# Patient Record
Sex: Female | Born: 1965 | Race: Black or African American | Hispanic: No | Marital: Single | Smoking: Current every day smoker
Health system: Southern US, Community
[De-identification: ages and names within clinical notes are randomized; demographics above are authoritative.]

## PROBLEM LIST (undated history)

## (undated) DIAGNOSIS — K449 Diaphragmatic hernia without obstruction or gangrene: Secondary | ICD-10-CM

## (undated) DIAGNOSIS — M1990 Unspecified osteoarthritis, unspecified site: Secondary | ICD-10-CM

## (undated) DIAGNOSIS — M719 Bursopathy, unspecified: Secondary | ICD-10-CM

## (undated) DIAGNOSIS — K219 Gastro-esophageal reflux disease without esophagitis: Secondary | ICD-10-CM

## (undated) DIAGNOSIS — F419 Anxiety disorder, unspecified: Secondary | ICD-10-CM

## (undated) DIAGNOSIS — G43909 Migraine, unspecified, not intractable, without status migrainosus: Secondary | ICD-10-CM

## (undated) DIAGNOSIS — J45909 Unspecified asthma, uncomplicated: Secondary | ICD-10-CM

## (undated) DIAGNOSIS — Z8619 Personal history of other infectious and parasitic diseases: Secondary | ICD-10-CM

## (undated) HISTORY — PX: BACK SURGERY: SHX140

## (undated) HISTORY — DX: Bursopathy, unspecified: M71.9

## (undated) HISTORY — PX: TUBAL LIGATION: SHX77

## (undated) HISTORY — DX: Gastro-esophageal reflux disease without esophagitis: K21.9

---

## 2006-06-11 ENCOUNTER — Encounter: Admission: RE | Admit: 2006-06-11 | Discharge: 2006-06-11 | Payer: Self-pay | Admitting: Specialist

## 2006-07-19 ENCOUNTER — Ambulatory Visit (HOSPITAL_COMMUNITY): Admission: RE | Admit: 2006-07-19 | Discharge: 2006-07-20 | Payer: Self-pay | Admitting: Specialist

## 2009-05-01 ENCOUNTER — Emergency Department (HOSPITAL_COMMUNITY): Admission: EM | Admit: 2009-05-01 | Discharge: 2009-05-01 | Payer: Self-pay | Admitting: Emergency Medicine

## 2010-05-24 ENCOUNTER — Emergency Department (HOSPITAL_COMMUNITY)
Admission: EM | Admit: 2010-05-24 | Discharge: 2010-05-24 | Payer: Self-pay | Source: Home / Self Care | Admitting: Family Medicine

## 2010-09-23 NOTE — Op Note (Signed)
[name], [name]              ACCOUNT NO.:  [id number]   MEDICAL RECORD NO.:  [id number]          PATIENT TYPE:  AMB   LOCATION:  DAY                          FACILITY:  [facility]   PHYSICIAN:  Jene Every, M.D.    DATE OF BIRTH:  11/11/1965   DATE OF PROCEDURE:  07/19/2006  DATE OF DISCHARGE:                               OPERATIVE REPORT   PREOPERATIVE DIAGNOSIS:  Spinal stenosis L4-5, right.   POSTOPERATIVE DIAGNOSIS:  Spinal stenosis L4-5, right.   PROCEDURE PERFORMED:  1. Lateral recessed decompression.  2. Foraminotomy of L5.   ANESTHESIA:  General.   ASSISTANT:  Roma Schanz, P.A.   BRIEF HISTORY/INDICATIONS:  A 45 year old with right lower extremity  radiculopathy, L5 nerve root distribution and positive neurotension  signs, EHL weakness, MRI, and myelogram indicating lateral recess  stenosis impinging upon the 5 root.  She was indicated for decompression  of L5 root, bilateral recess decompression.  The risks and benefits  discussed including bleeding, infection of the surrounding structures,  CSF leakage, __________ , need for procedure in the future, anesthetic  complications etcetera.   TECHNIQUE:  With the patient in the supine position after the  administration of general anesthesia and 1 gram Kefzol, she was placed  prone on the __________ frame and all bony prominences were well padded.  Lumbar region was prepped and draped in the usual sterile fashion.  A 22-  gauge spinal needle was used to localize the L4-5 interspace confirmed  with an x-ray.  Incision was made from the spinous process L4-5.  Subcutaneous tissue was resected and electrocautery used to achieve  hemostasis.  The dorsal lumbar fascia identified and __________ skin  incision.  The paraspinous muscle elevated from lamina 4 and 5.  The  McCullough retractor was placed.  Operating microscope was draped and  brought out onto the surgical field after a second radiograph confirmed  the 4-5  interspace.   A hemilaminotomy of the caudal edge of L4 was performed with a 2-3 mm  Kerrison to the cephalad edge of the ligamentum and flavum detaching it.  A foraminotomy of L5 was then performed after the ligamenta flavum  detached from the cephalad edge of L5.  Compression of the L5 root was  noted in the lateral recess, multifactorial.  A decompression of the  lateral recess in the medial border of the pedicle.  Foramina of the L5  root had at least 1 cm of excursion meeting in the pedicle without  difficulty.  This was fully evaluated and there was no evidence of a  significant disk herniation requiring a discectomy.  I passed a hockey-  stick probe in he foramen of L5 and L4 and found them widely patent  after the decompression.  The wound was copiously irrigated and  __________ surfaces.  Thrombin-soaked Gelfoam in the laminotomy defect.  I removed the McCullough retractors and no evidence of paraspinous  bleeding or CSF leakage.   The dorsal lumbar fascia was reapproximated with #1 Vicryl and  interrupted figure-of-eight sutures. Subcutaneous tissue reapproximated  with 2-0 Vicryl sutures; and skin was reapproximated with  Prolene.  The  wound was  reinforced with Steri-Strips, and a sterile dressing applied.  Placed  supine on the hospital bed and extubated without difficulty, and  transported to the recovery room in satisfactory condition.   The patient tolerated the procedure well, there were no complications.      Jene Every, M.D.  Electronically Signed     JB/MEDQ  D:  07/19/2006  T:  07/20/2006  Job:  [id number]

## 2010-10-31 ENCOUNTER — Inpatient Hospital Stay (INDEPENDENT_AMBULATORY_CARE_PROVIDER_SITE_OTHER)
Admission: RE | Admit: 2010-10-31 | Discharge: 2010-10-31 | Disposition: A | Discharge status: Home / Self Care | Payer: Self-pay | Source: Ambulatory Visit | Attending: Family Medicine | Admitting: Family Medicine

## 2010-10-31 DIAGNOSIS — N898 Other specified noninflammatory disorders of vagina: Secondary | ICD-10-CM

## 2010-10-31 DIAGNOSIS — L02419 Cutaneous abscess of limb, unspecified: Secondary | ICD-10-CM

## 2010-10-31 LAB — POCT I-STAT, CHEM 8
Glucose, Bld: 92 mg/dL (ref 70–99)
HCT: 43 % (ref 36.0–46.0)

## 2010-10-31 LAB — POCT PREGNANCY, URINE: Preg Test, Ur: NEGATIVE

## 2010-12-10 ENCOUNTER — Inpatient Hospital Stay (INDEPENDENT_AMBULATORY_CARE_PROVIDER_SITE_OTHER)
Admission: RE | Admit: 2010-12-10 | Discharge: 2010-12-10 | Disposition: A | Discharge status: Home / Self Care | Payer: Self-pay | Source: Ambulatory Visit | Attending: Emergency Medicine | Admitting: Emergency Medicine

## 2010-12-10 DIAGNOSIS — T6391XA Toxic effect of contact with unspecified venomous animal, accidental (unintentional), initial encounter: Secondary | ICD-10-CM

## 2011-01-22 ENCOUNTER — Emergency Department (HOSPITAL_COMMUNITY): Payer: Self-pay

## 2011-01-22 ENCOUNTER — Emergency Department (HOSPITAL_COMMUNITY)
Admission: EM | Admit: 2011-01-22 | Discharge: 2011-01-23 | Disposition: A | Discharge status: Home / Self Care | Payer: Self-pay | Attending: Emergency Medicine | Admitting: Emergency Medicine

## 2011-01-22 DIAGNOSIS — X58XXXA Exposure to other specified factors, initial encounter: Secondary | ICD-10-CM | POA: Insufficient documentation

## 2011-01-22 DIAGNOSIS — Y9289 Other specified places as the place of occurrence of the external cause: Secondary | ICD-10-CM | POA: Insufficient documentation

## 2011-01-22 DIAGNOSIS — M25579 Pain in unspecified ankle and joints of unspecified foot: Secondary | ICD-10-CM | POA: Insufficient documentation

## 2011-01-22 DIAGNOSIS — S93409A Sprain of unspecified ligament of unspecified ankle, initial encounter: Secondary | ICD-10-CM | POA: Insufficient documentation

## 2011-03-25 ENCOUNTER — Encounter: Payer: Self-pay | Admitting: *Deleted

## 2011-03-25 ENCOUNTER — Emergency Department (HOSPITAL_COMMUNITY)
Admission: EM | Admit: 2011-03-25 | Discharge: 2011-03-26 | Disposition: A | Discharge status: Home / Self Care | Payer: Self-pay | Attending: Emergency Medicine | Admitting: Emergency Medicine

## 2011-03-25 DIAGNOSIS — M25571 Pain in right ankle and joints of right foot: Secondary | ICD-10-CM

## 2011-03-25 DIAGNOSIS — M545 Low back pain, unspecified: Secondary | ICD-10-CM | POA: Insufficient documentation

## 2011-03-25 DIAGNOSIS — M79609 Pain in unspecified limb: Secondary | ICD-10-CM | POA: Insufficient documentation

## 2011-03-25 DIAGNOSIS — F17200 Nicotine dependence, unspecified, uncomplicated: Secondary | ICD-10-CM | POA: Insufficient documentation

## 2011-03-25 DIAGNOSIS — M25473 Effusion, unspecified ankle: Secondary | ICD-10-CM | POA: Insufficient documentation

## 2011-03-25 DIAGNOSIS — IMO0001 Reserved for inherently not codable concepts without codable children: Secondary | ICD-10-CM | POA: Insufficient documentation

## 2011-03-25 DIAGNOSIS — M25476 Effusion, unspecified foot: Secondary | ICD-10-CM | POA: Insufficient documentation

## 2011-03-25 DIAGNOSIS — M5431 Sciatica, right side: Secondary | ICD-10-CM

## 2011-03-25 DIAGNOSIS — M5430 Sciatica, unspecified side: Secondary | ICD-10-CM | POA: Insufficient documentation

## 2011-03-25 DIAGNOSIS — M25579 Pain in unspecified ankle and joints of unspecified foot: Secondary | ICD-10-CM | POA: Insufficient documentation

## 2011-03-25 NOTE — ED Notes (Signed)
Patient c/o lower back pain radiating to her leg and foot.  Symptoms started yesteday

## 2011-03-26 ENCOUNTER — Emergency Department (HOSPITAL_COMMUNITY): Payer: Self-pay

## 2011-03-26 MED ORDER — METHYLPREDNISOLONE 4 MG PO KIT: 4.0000 mg | PACK | Freq: Once | ORAL | Status: DC

## 2011-03-26 NOTE — ED Provider Notes (Signed)
History     CSN: [id number] Arrival date & time: 03/25/2011  9:49 PM   First MD Initiated Contact with Patient 03/25/11 2342      Chief Complaint  Patient presents with  . Back Pain  . Leg Pain  . Foot Pain    (Consider location/radiation/quality/duration/timing/severity/associated sxs/prior treatment) Patient is a 45 y.o. female presenting with back pain. The history is provided by the patient.  Back Pain  This is a new problem. The problem occurs constantly. The pain is associated with no known injury. The pain is present in the gluteal region and lumbar spine. The quality of the pain is described as shooting. The pain radiates to the right thigh. The pain is moderate. The symptoms are aggravated by certain positions. The pain is the same all the time. Pertinent negatives include no chest pain, no fever, no numbness, no headaches, no abdominal pain, no bowel incontinence, no perianal numbness, no bladder incontinence, no pelvic pain, no leg pain, no paresthesias, no tingling and no weakness.  She is s/p lumbar disc surgery (she thinks this was L4-L5) which was performed by a local orthopedist. For the past 2 days she has had pain in her back which seems to radiate into the R gluteal region and down her leg to the knee.  Additionally, she presents with some ankle pain which has been present for the last several weeks. She sprained her ankle which she thinks probably started the problem. The pain seems to be mostly in the lateral dorsum, and is worse when she has to stand or walk a lot.   History reviewed. No pertinent past medical history.  History reviewed. No pertinent past surgical history.  History reviewed. No pertinent family history.  History  Substance Use Topics  . Smoking status: Current Everyday Smoker -- 1.0 packs/day  . Smokeless tobacco: Not on file  . Alcohol Use: No    OB History    Grav Para Term Preterm Abortions TAB SAB Ect Mult Living                   Review of Systems  Constitutional: Negative for fever, chills and activity change.  HENT: Negative for neck pain and neck stiffness.   Respiratory: Negative for shortness of breath.   Cardiovascular: Negative for chest pain.  Gastrointestinal: Negative for abdominal pain and bowel incontinence.  Genitourinary: Negative for bladder incontinence, frequency, flank pain, difficulty urinating and pelvic pain.  Musculoskeletal: Positive for myalgias and back pain. Negative for joint swelling and gait problem.  Skin: Negative for rash.  Neurological: Negative for tingling, weakness, numbness, headaches and paresthesias.    Allergies  Review of patient's allergies indicates no known allergies.  Home Medications   Current Outpatient Rx  Name Route Sig Dispense Refill  . CITALOPRAM HYDROBROMIDE 40 MG PO TABS Oral Take 40 mg by mouth daily.      . IBUPROFEN 200 MG PO TABS Oral Take 400 mg by mouth every 6 (six) hours as needed. pain       BP 136/74  Pulse 83  Temp(Src) 98.3 F (36.8 C) (Oral)  Resp 16  SpO2 98%  Physical Exam  Nursing note and vitals reviewed. Constitutional: She appears well-developed and well-nourished. No distress.  HENT:  Head: Normocephalic and atraumatic.  Right Ear: External ear normal.  Left Ear: External ear normal.  Nose: Nose normal.  Mouth/Throat: Oropharynx is clear and moist. No oropharyngeal exudate.  Eyes: Conjunctivae are normal. Pupils are equal, round, and  reactive to light.  Neck: Normal range of motion.  Musculoskeletal: Normal range of motion.       Spine: no palpable stepoff, crepitus, deformity. No paraspinal muscle spasm. Tender to palpation in gluteal region. Straight leg raise positive.  Ankle: tender to palpation over lateral malleolus and over ATF. No palpable deformity, crepitus. FROM and strength.  Skin: She is not diaphoretic.    ED Course  Procedures (including critical care time)  Labs Reviewed - No data to display Dg  Ankle Complete Right  03/26/2011  *RADIOLOGY REPORT*  Clinical Data: Lateral ankle pain.  RIGHT ANKLE - COMPLETE 3+ VIEW  Comparison: 01/22/2011  Findings: Mild soft tissue swelling overlies the lateral malleolus. There is no acute fracture or dislocation.  Ankle mortise remains intact.  Midfoot DJD.  Posterior calcaneal enthesiophyte/calcification along the Achilles tendon insertion, similar to prior.  IMPRESSION: No acute osseous abnormality.  Midfoot DJD.  Original Report Authenticated By: Waneta Martins, M.D.     1. Sciatica of right side   2. Ankle pain, right       MDM  Radicular pain c/w sciatica on R. Will tx with prednisone. Plain film of ankle unremarkable apart from soft tissue swelling, poss DJD midfoot. It is possible that she has a ligamentous injury. Will refer to ortho for further eval/mgmt. She was instructed to use RICE in the meantime. She verbalized understanding and agreed to plan.        Grant Fontana, [place] 03/27/11 1003

## 2011-03-26 NOTE — ED Notes (Signed)
Pt given ace wrap for ankle

## 2011-03-29 NOTE — ED Provider Notes (Signed)
Evaluation and management procedures were performed by the mid-level provider (PA/NP/CNM) under my supervision/collaboration. I was present and available during the ED course. Debroah Shuttleworth Y.   Gavin Pound. Romir Klimowicz, MD 03/29/11 1021

## 2012-02-06 ENCOUNTER — Emergency Department (HOSPITAL_COMMUNITY)
Admission: EM | Admit: 2012-02-06 | Discharge: 2012-02-06 | Disposition: A | Discharge status: Home / Self Care | Payer: Medicaid Other | Attending: Emergency Medicine | Admitting: Emergency Medicine

## 2012-02-06 ENCOUNTER — Encounter (HOSPITAL_COMMUNITY): Payer: Self-pay | Admitting: *Deleted

## 2012-02-06 DIAGNOSIS — IMO0002 Reserved for concepts with insufficient information to code with codable children: Secondary | ICD-10-CM | POA: Insufficient documentation

## 2012-02-06 DIAGNOSIS — F17200 Nicotine dependence, unspecified, uncomplicated: Secondary | ICD-10-CM | POA: Insufficient documentation

## 2012-02-06 DIAGNOSIS — S39012A Strain of muscle, fascia and tendon of lower back, initial encounter: Secondary | ICD-10-CM

## 2012-02-06 DIAGNOSIS — M7661 Achilles tendinitis, right leg: Secondary | ICD-10-CM

## 2012-02-06 DIAGNOSIS — X58XXXA Exposure to other specified factors, initial encounter: Secondary | ICD-10-CM | POA: Insufficient documentation

## 2012-02-06 DIAGNOSIS — M25569 Pain in unspecified knee: Secondary | ICD-10-CM | POA: Insufficient documentation

## 2012-02-06 DIAGNOSIS — M5410 Radiculopathy, site unspecified: Secondary | ICD-10-CM

## 2012-02-06 DIAGNOSIS — G8929 Other chronic pain: Secondary | ICD-10-CM | POA: Insufficient documentation

## 2012-02-06 DIAGNOSIS — S335XXA Sprain of ligaments of lumbar spine, initial encounter: Secondary | ICD-10-CM | POA: Insufficient documentation

## 2012-02-06 MED ORDER — OXYCODONE-ACETAMINOPHEN 5-325 MG PO TABS: 1.0000 | ORAL_TABLET | ORAL | Status: DC | PRN

## 2012-02-06 MED ORDER — KETOROLAC TROMETHAMINE 60 MG/2ML IM SOLN
60.0000 mg | Freq: Once | INTRAMUSCULAR | Status: AC
  Administered 2012-02-06: 60 mg via INTRAMUSCULAR
  Filled 2012-02-06: qty 2

## 2012-02-06 NOTE — ED Provider Notes (Signed)
History     CSN: [id number]  Arrival date & time 02/06/12  0140   First MD Initiated Contact with Patient 02/06/12 [phone]      Chief Complaint  Patient presents with  . Back Pain  . Hip Pain  . Knee Pain  . Foot Pain    (Consider location/radiation/quality/duration/timing/severity/associated sxs/prior treatment) HPI History provided by pt and prior chart.  Pt presents w/ gradually worsening left low back pain x 2 months.  Has also had a toothache-like pain in right buttock with associated paresthesias in RLE.  Denies trauma.  Denies fever, urinary sx and bladder/bowel retention/incontinence.  Per prior chart, pt has h/o lumbar stenosis and underwent a Lateral recessed decompression and Foraminotomy of L5 in 2008.  Also c/o chronic pain in R knee w/ occasional giving way and several days of pain in posterior right heel that is aggravated by bearing weight and is most prominent first thing in the morning.  Has been taking tylenol for ankle, knee and low back pain w/out relief.   History reviewed. No pertinent past medical history.  Past Surgical History  Procedure Date  . Back surgery   . Tubal ligation     No family history on file.  History  Substance Use Topics  . Smoking status: Current Every Day Smoker -- 1.0 packs/day  . Smokeless tobacco: Not on file  . Alcohol Use: No    OB History    Grav Para Term Preterm Abortions TAB SAB Ect Mult Living                  Review of Systems  All other systems reviewed and are negative.    Allergies  Review of patient's allergies indicates no known allergies.  Home Medications   Current Outpatient Rx  Name Route Sig Dispense Refill  . ACETAMINOPHEN 325 MG PO TABS Oral Take 650 mg by mouth every 6 (six) hours as needed. For pain      LMP 01/22/2012  Physical Exam  Nursing note and vitals reviewed. Constitutional: She is oriented to person, place, and time. She appears well-developed and well-nourished.  HENT:  Head:  Normocephalic and atraumatic.  Eyes:       Normal appearance  Neck: Normal range of motion.  Cardiovascular: Normal rate and regular rhythm.   Pulmonary/Chest: Effort normal and breath sounds normal.  Genitourinary:       No CVA ttp  Musculoskeletal:       Vertical surgical scar at approx L3-L5.  Mild tenderness in this location as well as left lumbar paraspinals.  Full active ROM of LE.  Nml patellar reflexes.  No saddle anesthesia. Distal sensation intact.  2+ DP pulses.  Ambulates w/out diffulty.  Right knee w/out edema, skin changes or tenderness.  Minimal pain w/ passive ROM.  Tenderness of achilles tendon.  No overlying skin changes. Pain at achilles w/ dorsi/plantar flexion of foot.    Neurological: She is alert and oriented to person, place, and time.  Skin: Skin is warm and dry. No rash noted.  Psychiatric: She has a normal mood and affect. Her behavior is normal.    ED Course  Procedures (including critical care time)  Labs Reviewed - No data to display No results found.   1. Lumbar strain   2. Radiculopathy       MDM  46yo healthy F w/ h/o lumbar stenosis, s/p lateral recess decompression surgery in 2008, presents w/ non-traumatic, gradually worsening left low back pain and right  buttock pain w/ RLE paresthesias.  S/sx most consistent w/ left lumbar paraspinal strain as well as right sciatica.  No back pain red flags.  Also c/o chronic right knee pain w/ occasional giving way.  No signs of joint infection on exam.  Ortho tech placed in knee immobilizer.  Also c/o pain at right achilles tendon.  Most likely has tendonitis based on exam.  Pt received IM toradol and d/c'd home w/ percocet and referral back to ortho for all of her complaints.  Return precautions discussed.         Otilio Miu, [place] 02/06/12 [phone]

## 2012-02-06 NOTE — ED Notes (Signed)
Pt stated she was seen in a hospital in [place] and was diagnosed with heel spur.  Pt reports pain with foot flexion and difficulty walking. Pt stated she cannot bend down due to pain,.

## 2012-02-06 NOTE — ED Notes (Addendum)
C/o low back pain, R hip, R knee and bilateral heel pain. Onset 1 month ago, "fairly constant". H/o 2009 back surgery (Dr. Shelle Iron). Denies sx other than pain. (denies: nvd, fever, bleeding urinary or vaginal sx). Tried tylenol ES at 1800 w/o relief. Denies recent activity that would cause pain or injury. Alert, NAD, calm, interactive.

## 2012-06-17 ENCOUNTER — Emergency Department (HOSPITAL_COMMUNITY)
Admission: EM | Admit: 2012-06-17 | Discharge: 2012-06-17 | Disposition: A | Discharge status: Home / Self Care | Payer: Medicaid Other | Attending: Emergency Medicine | Admitting: Emergency Medicine

## 2012-06-17 ENCOUNTER — Encounter (HOSPITAL_COMMUNITY): Payer: Self-pay | Admitting: Emergency Medicine

## 2012-06-17 DIAGNOSIS — M5416 Radiculopathy, lumbar region: Secondary | ICD-10-CM

## 2012-06-17 DIAGNOSIS — M5430 Sciatica, unspecified side: Secondary | ICD-10-CM | POA: Insufficient documentation

## 2012-06-17 DIAGNOSIS — IMO0002 Reserved for concepts with insufficient information to code with codable children: Secondary | ICD-10-CM | POA: Insufficient documentation

## 2012-06-17 DIAGNOSIS — M25559 Pain in unspecified hip: Secondary | ICD-10-CM | POA: Insufficient documentation

## 2012-06-17 DIAGNOSIS — M129 Arthropathy, unspecified: Secondary | ICD-10-CM | POA: Insufficient documentation

## 2012-06-17 DIAGNOSIS — Z9889 Other specified postprocedural states: Secondary | ICD-10-CM | POA: Insufficient documentation

## 2012-06-17 DIAGNOSIS — J45909 Unspecified asthma, uncomplicated: Secondary | ICD-10-CM | POA: Insufficient documentation

## 2012-06-17 DIAGNOSIS — F17200 Nicotine dependence, unspecified, uncomplicated: Secondary | ICD-10-CM | POA: Insufficient documentation

## 2012-06-17 HISTORY — DX: Unspecified osteoarthritis, unspecified site: M19.90

## 2012-06-17 HISTORY — DX: Unspecified asthma, uncomplicated: J45.909

## 2012-06-17 MED ORDER — HYDROCODONE-ACETAMINOPHEN 5-325 MG PO TABS: 1.0000 | ORAL_TABLET | ORAL | Status: DC | PRN

## 2012-06-17 MED ORDER — IBUPROFEN 600 MG PO TABS: 600.0000 mg | ORAL_TABLET | Freq: Four times a day (QID) | ORAL | Status: DC | PRN

## 2012-06-17 MED ORDER — DIAZEPAM 2 MG PO TABS
2.0000 mg | ORAL_TABLET | Freq: Once | ORAL | Status: AC
  Administered 2012-06-17: 2 mg via ORAL
  Filled 2012-06-17: qty 1

## 2012-06-17 MED ORDER — PREDNISONE 50 MG PO TABS: 50.0000 mg | ORAL_TABLET | Freq: Every day | ORAL | Status: DC

## 2012-06-17 MED ORDER — IBUPROFEN 200 MG PO TABS
600.0000 mg | ORAL_TABLET | Freq: Once | ORAL | Status: AC
  Administered 2012-06-17: 600 mg via ORAL
  Filled 2012-06-17: qty 1

## 2012-06-17 MED ORDER — METHOCARBAMOL 500 MG PO TABS: 500.0000 mg | ORAL_TABLET | Freq: Two times a day (BID) | ORAL | Status: DC

## 2012-06-17 MED ORDER — PREDNISONE 20 MG PO TABS
60.0000 mg | ORAL_TABLET | Freq: Once | ORAL | Status: AC
  Administered 2012-06-17: 60 mg via ORAL
  Filled 2012-06-17: qty 3

## 2012-06-17 MED ORDER — HYDROCODONE-ACETAMINOPHEN 5-325 MG PO TABS
2.0000 | ORAL_TABLET | Freq: Once | ORAL | Status: AC
  Administered 2012-06-17: 2 via ORAL
  Filled 2012-06-17: qty 2

## 2012-06-17 NOTE — ED Provider Notes (Addendum)
History     This chart was scribed for Derwood Kaplan, MD, MD by Smitty Pluck, ED Scribe. The patient was seen in room TR11C/TR11C and the patient's care was started at 9:25 PM.   CSN: [id number]  Arrival date & time 06/17/12  2041   Chief Complaint  Patient presents with  . Hip Pain  . Back Pain    (Consider location/radiation/quality/duration/timing/severity/associated sxs/prior treatment) The history is provided by the patient and medical records. No language interpreter was used.   MIRAH NEVINS is a 47 y.o. female who presents to the Emergency Department complaining of constant, moderate sharp pain enitre back pain and right hip pain that radiates to right leg onset 10 minutes ago today. She reports sudden onset. She reports that she was sitting down when pain began. Twisting, rotating, movement and bending aggravates the pain. She states she takes ibuprofen daily for back pain. She reports that she has hx of back pain, sciatica and back surgery. This pain feels similar to past back pain. Pt denies recent injury, vaginal discharge, urinary incontinence, bowel incontinence, fever, chills, nausea, vomiting, diarrhea, weakness, cough, SOB and any other pain. She denies hx of cancer. She reports that she smokes 1 pack/day.     Past Medical History  Diagnosis Date  . Arthritis     Past Surgical History  Procedure Laterality Date  . Back surgery    . Tubal ligation      History reviewed. No pertinent family history.  History  Substance Use Topics  . Smoking status: Current Every Day Smoker -- 1.00 packs/day  . Smokeless tobacco: Not on file  . Alcohol Use: No    OB History   Grav Para Term Preterm Abortions TAB SAB Ect Mult Living                  Review of Systems  Constitutional: Negative for fever and chills.  Respiratory: Negative for shortness of breath.   Gastrointestinal: Negative for nausea and vomiting.  Musculoskeletal: Positive for back pain and  arthralgias.  Neurological: Negative for weakness.  All other systems reviewed and are negative.    Allergies  Review of patient's allergies indicates no known allergies.  Home Medications   Current Outpatient Rx  Name  Route  Sig  Dispense  Refill  . acetaminophen (TYLENOL) 325 MG tablet   Oral   Take 650 mg by mouth every 6 (six) hours as needed. For pain         . oxyCODONE-acetaminophen (PERCOCET/ROXICET) 5-325 MG per tablet   Oral   Take 1 tablet by mouth every 4 (four) hours as needed for pain.   20 tablet   0     BP 132/68  Pulse 80  Temp(Src) 98.9 F (37.2 C) (Oral)  Resp 16  SpO2 98%  LMP 05/27/2012  Physical Exam  Nursing note and vitals reviewed. Constitutional: She is oriented to person, place, and time. She appears well-developed and well-nourished. No distress.  HENT:  Head: Normocephalic and atraumatic.  Eyes: EOM are normal.  Neck: Neck supple. No tracheal deviation present.  Cardiovascular: Normal rate, regular rhythm and normal heart sounds.   No murmur heard. Pulmonary/Chest: Effort normal and breath sounds normal. No respiratory distress. She has no wheezes. She has no rales.  Musculoskeletal: Normal range of motion.  No tenderness with palpation of vertebral column or lower back.  No step off.    Neurological: She is alert and oriented to person, place, and time.  Negative passive straight leg on right side  Positive active straight leg on right side   Skin: Skin is warm and dry.  Psychiatric: She has a normal mood and affect. Her behavior is normal.    ED Course  Procedures (including critical care time) DIAGNOSTIC STUDIES: Oxygen Saturation is 98% on room air, normal by my interpretation.    COORDINATION OF CARE: 9:33 PM Discussed ED treatment with pt and pt agrees.     Labs Reviewed - No data to display No results found.   No diagnosis found.    MDM  I personally performed the services described in this documentation,  which was scribed in my presence. The recorded information has been reviewed and is accurate.  Pt comes in with cc of back pain. Pt's pain is consistent with sciatica - and she reports having been diagnosed with lower lumbar nerve impingement and sciatica in the past.  Will give appropriate meds and discharge.    Derwood Kaplan, MD 06/17/12 [id number]  Derwood Kaplan, MD 06/17/12 2152

## 2012-06-17 NOTE — Discharge Instructions (Signed)
Back Exercises Back exercises help treat and prevent back injuries. The goal of back exercises is to increase the strength of your abdominal and back muscles and the flexibility of your back. These exercises should be started when you no longer have back pain. Back exercises include:  Pelvic Tilt. Lie on your back with your knees bent. Tilt your pelvis until the lower part of your back is against the floor. Hold this position 5 to 10 sec and repeat 5 to 10 times.  Knee to Chest. Pull first 1 knee up against your chest and hold for 20 to 30 seconds, repeat this with the other knee, and then both knees. This may be done with the other leg straight or bent, whichever feels better.  Sit-Ups or Curl-Ups. Bend your knees 90 degrees. Start with tilting your pelvis, and do a partial, slow sit-up, lifting your trunk only 30 to 45 degrees off the floor. Take at least 2 to 3 seconds for each sit-up. Do not do sit-ups with your knees out straight. If partial sit-ups are difficult, simply do the above but with only tightening your abdominal muscles and holding it as directed.  Hip-Lift. Lie on your back with your knees flexed 90 degrees. Push down with your feet and shoulders as you raise your hips a couple inches off the floor; hold for 10 seconds, repeat 5 to 10 times.  Back arches. Lie on your stomach, propping yourself up on bent elbows. Slowly press on your hands, causing an arch in your low back. Repeat 3 to 5 times. Any initial stiffness and discomfort should lessen with repetition over time.  Shoulder-Lifts. Lie face down with arms beside your body. Keep hips and torso pressed to floor as you slowly lift your head and shoulders off the floor. Do not overdo your exercises, especially in the beginning. Exercises may cause you some mild back discomfort which lasts for a few minutes; however, if the pain is more severe, or lasts for more than 15 minutes, do not continue exercises until you see your caregiver.  Improvement with exercise therapy for back problems is slow.  See your caregivers for assistance with developing a proper back exercise program. Document Released: 06/01/2004 Document Revised: 07/17/2011 Document Reviewed: 02/23/2011 [facility] Patient Information 2013 [place], [place].  Lumbosacral Radiculopathy Lumbosacral radiculopathy is a pinched nerve or nerves in the low back (lumbosacral area). When this happens you may have weakness in your legs and may not be able to stand on your toes. You may have pain going down into your legs. There may be difficulties with walking normally. There are many causes of this problem. Sometimes this may happen from an injury, or simply from arthritis or boney problems. It may also be caused by other illnesses such as diabetes. If there is no improvement after treatment, further studies may be done to find the exact cause. DIAGNOSIS  X-rays may be needed if the problems become long standing. Electromyograms may be done. This study is one in which the working of nerves and muscles is studied. HOME CARE INSTRUCTIONS   Applications of ice packs may be helpful. Ice can be used in a plastic bag with a towel around it to prevent frostbite to skin. This may be used every 2 hours for 20 to 30 minutes, or as needed, while awake, or as directed by your caregiver.  Only take over-the-counter or prescription medicines for pain, discomfort, or fever as directed by your caregiver.  If physical therapy was prescribed, follow  your caregiver's directions. SEEK IMMEDIATE MEDICAL CARE IF:   You have pain not controlled with medications.  You seem to be getting worse rather than better.  You develop increasing weakness in your legs.  You develop loss of bowel or bladder control.  You have difficulty with walking or balance, or develop clumsiness in the use of your legs.  You have a fever. MAKE SURE YOU:   Understand these instructions.  Will watch your  condition.  Will get help right away if you are not doing well or get worse. Document Released: 04/24/2005 Document Revised: 07/17/2011 Document Reviewed: 12/13/2007 [facility] Patient Information 2013 [place], [place].

## 2012-06-17 NOTE — ED Notes (Signed)
Patient complaining of lower back pain and left hip pain that started ten minutes ago; patient has history of back surgery.  Denies any recent injury.  Ambulatory in triage; able to move all extremities without difficulty.  Denies loss in sensation or swelling.

## 2012-10-09 ENCOUNTER — Encounter (HOSPITAL_COMMUNITY): Payer: Self-pay | Admitting: *Deleted

## 2012-10-09 ENCOUNTER — Emergency Department (HOSPITAL_COMMUNITY)
Admission: EM | Admit: 2012-10-09 | Discharge: 2012-10-09 | Disposition: A | Discharge status: Home / Self Care | Payer: Medicaid Other | Attending: Emergency Medicine | Admitting: Emergency Medicine

## 2012-10-09 ENCOUNTER — Emergency Department (HOSPITAL_COMMUNITY): Payer: Medicaid Other

## 2012-10-09 DIAGNOSIS — Z8739 Personal history of other diseases of the musculoskeletal system and connective tissue: Secondary | ICD-10-CM | POA: Insufficient documentation

## 2012-10-09 DIAGNOSIS — M5430 Sciatica, unspecified side: Secondary | ICD-10-CM | POA: Insufficient documentation

## 2012-10-09 DIAGNOSIS — J45909 Unspecified asthma, uncomplicated: Secondary | ICD-10-CM | POA: Insufficient documentation

## 2012-10-09 DIAGNOSIS — Z79899 Other long term (current) drug therapy: Secondary | ICD-10-CM | POA: Insufficient documentation

## 2012-10-09 DIAGNOSIS — M79609 Pain in unspecified limb: Secondary | ICD-10-CM | POA: Insufficient documentation

## 2012-10-09 DIAGNOSIS — F17200 Nicotine dependence, unspecified, uncomplicated: Secondary | ICD-10-CM | POA: Insufficient documentation

## 2012-10-09 DIAGNOSIS — Z3202 Encounter for pregnancy test, result negative: Secondary | ICD-10-CM | POA: Insufficient documentation

## 2012-10-09 DIAGNOSIS — G8929 Other chronic pain: Secondary | ICD-10-CM | POA: Insufficient documentation

## 2012-10-09 DIAGNOSIS — R109 Unspecified abdominal pain: Secondary | ICD-10-CM | POA: Insufficient documentation

## 2012-10-09 DIAGNOSIS — Z9889 Other specified postprocedural states: Secondary | ICD-10-CM | POA: Insufficient documentation

## 2012-10-09 DIAGNOSIS — M7731 Calcaneal spur, right foot: Secondary | ICD-10-CM

## 2012-10-09 DIAGNOSIS — M549 Dorsalgia, unspecified: Secondary | ICD-10-CM

## 2012-10-09 DIAGNOSIS — M545 Low back pain, unspecified: Secondary | ICD-10-CM | POA: Insufficient documentation

## 2012-10-09 DIAGNOSIS — M7730 Calcaneal spur, unspecified foot: Secondary | ICD-10-CM | POA: Insufficient documentation

## 2012-10-09 LAB — CBC WITH DIFFERENTIAL/PLATELET
Eosinophils Absolute: 0.2 10*3/uL (ref 0.0–0.7)
Eosinophils Relative: 1 % (ref 0–5)
HCT: 37.4 % (ref 36.0–46.0)
Hemoglobin: 12.4 g/dL (ref 12.0–15.0)
MCH: 29 pg (ref 26.0–34.0)
MCHC: 33.2 g/dL (ref 30.0–36.0)
MCV: 87.4 fL (ref 78.0–100.0)
Monocytes Absolute: 0.7 10*3/uL (ref 0.1–1.0)
Neutrophils Relative %: 52 % (ref 43–77)
RBC: 4.28 MIL/uL (ref 3.87–5.11)
WBC: 11.6 10*3/uL — ABNORMAL HIGH (ref 4.0–10.5)

## 2012-10-09 LAB — URINALYSIS, ROUTINE W REFLEX MICROSCOPIC
Bilirubin Urine: NEGATIVE
Specific Gravity, Urine: 1.025 (ref 1.005–1.030)
Urobilinogen, UA: 1 mg/dL (ref 0.0–1.0)

## 2012-10-09 LAB — COMPREHENSIVE METABOLIC PANEL
Albumin: 3.3 g/dL — ABNORMAL LOW (ref 3.5–5.2)
Alkaline Phosphatase: 73 U/L (ref 39–117)
BUN: 13 mg/dL (ref 6–23)
CO2: 27 mEq/L (ref 19–32)
Calcium: 9.6 mg/dL (ref 8.4–10.5)
Chloride: 107 mEq/L (ref 96–112)
Creatinine, Ser: 0.74 mg/dL (ref 0.50–1.10)

## 2012-10-09 LAB — URINE MICROSCOPIC-ADD ON

## 2012-10-09 MED ORDER — OXYCODONE-ACETAMINOPHEN 5-325 MG PO TABS: 2.0000 | ORAL_TABLET | ORAL | Status: DC | PRN

## 2012-10-09 MED ORDER — KETOROLAC TROMETHAMINE 60 MG/2ML IM SOLN
60.0000 mg | Freq: Once | INTRAMUSCULAR | Status: AC
  Administered 2012-10-09: 60 mg via INTRAMUSCULAR
  Filled 2012-10-09: qty 2

## 2012-10-09 MED ORDER — DIAZEPAM 5 MG/ML IJ SOLN
5.0000 mg | Freq: Once | INTRAMUSCULAR | Status: AC
  Administered 2012-10-09: 5 mg via INTRAMUSCULAR
  Filled 2012-10-09: qty 2

## 2012-10-09 MED ORDER — MORPHINE SULFATE 4 MG/ML IJ SOLN
4.0000 mg | Freq: Once | INTRAMUSCULAR | Status: AC
  Administered 2012-10-09: 4 mg via INTRAMUSCULAR
  Filled 2012-10-09: qty 1

## 2012-10-09 NOTE — ED Provider Notes (Signed)
History     CSN: [id number]  Arrival date & time 10/09/12  0056   First MD Initiated Contact with Patient 10/09/12 0209      Chief Complaint  Patient presents with  . pain over her body     (Consider location/radiation/quality/duration/timing/severity/associated sxs/prior treatment) The history is provided by the patient.  Teresa Faulkner is a 47 y.o. female history of arthritis, asthma here presenting with lower back pain radiating down her legs as well as right heel pain. She complains of pain all over but is worse in her back and radiate down her legs. She has history of sciatica and came to the ER several months ago and was given some pain meds and she ran out. Pain is chronic but worsening in the last 3 days. No falls or injury and no numbness or tingling or weakness. She also has R heel pain. She has history of heel spur and the pain got worse as well to the point that its difficult for her to walk on it. She has some lower abdominal pain but no urinary symptoms.    Past Medical History  Diagnosis Date  . Arthritis   . Asthma     Past Surgical History  Procedure Laterality Date  . Back surgery    . Tubal ligation      No family history on file.  History  Substance Use Topics  . Smoking status: Current Every Day Smoker -- 1.00 packs/day  . Smokeless tobacco: Not on file  . Alcohol Use: No    OB History   Grav Para Term Preterm Abortions TAB SAB Ect Mult Living                  Review of Systems  Gastrointestinal: Positive for abdominal pain.  Musculoskeletal: Positive for back pain.  All other systems reviewed and are negative.    Allergies  Review of patient's allergies indicates no known allergies.  Home Medications   Current Outpatient Rx  Name  Route  Sig  Dispense  Refill  . omeprazole (PRILOSEC OTC) 20 MG tablet   Oral   Take 20 mg by mouth daily.           BP 126/64  Pulse 84  Temp(Src) 98.4 F (36.9 C)  Resp 20  SpO2 97%  LMP  07/09/2012  Physical Exam  Nursing note and vitals reviewed. Constitutional: She is oriented to person, place, and time. She appears well-developed and well-nourished.  Slightly uncomfortable   HENT:  Head: Normocephalic.  Mouth/Throat: Oropharynx is clear and moist.  Eyes: Conjunctivae are normal.  Neck: Normal range of motion. Neck supple.  Cardiovascular: Normal rate, regular rhythm and normal heart sounds.   Pulmonary/Chest: Effort normal and breath sounds normal. No respiratory distress. She has no wheezes. She has no rales.  Abdominal: Soft. Bowel sounds are normal. She exhibits no distension. There is no tenderness. There is no rebound.  Musculoskeletal: Normal range of motion.  Neg straight leg raise. Bilateral paralumbar tenderness, no midline tenderness. Mild tenderness on R heel area.   Neurological: She is alert and oriented to person, place, and time.  No saddle anesthesia. Neurovascular intact in lower extremities.   Skin: Skin is warm and dry.  Psychiatric: She has a normal mood and affect. Her behavior is normal. Judgment and thought content normal.    ED Course  Procedures (including critical care time)  Labs Reviewed  URINALYSIS, ROUTINE W REFLEX MICROSCOPIC - Abnormal; Notable for the following:  APPearance CLOUDY (*)    Leukocytes, UA TRACE (*)    All other components within normal limits  CBC WITH DIFFERENTIAL - Abnormal; Notable for the following:    WBC 11.6 (*)    Lymphs Abs 4.7 (*)    All other components within normal limits  COMPREHENSIVE METABOLIC PANEL - Abnormal; Notable for the following:    Glucose, Bld 133 (*)    Albumin 3.3 (*)    Total Bilirubin 0.1 (*)    All other components within normal limits  URINE MICROSCOPIC-ADD ON - Abnormal; Notable for the following:    Squamous Epithelial / LPF MANY (*)    Bacteria, UA FEW (*)    All other components within normal limits  PREGNANCY, URINE   Dg Foot Complete Right  10/09/2012   *RADIOLOGY  REPORT*  Clinical Data: Right-sided foot pain at the heel.  RIGHT FOOT COMPLETE - 3+ VIEW  Comparison: No priors.  Findings: Three views of the right foot demonstrate no acute displaced fracture, subluxation or dislocation.  A large dorsal calcaneal spur is noted and similar to the prior examination. Moderate degenerative changes of osteoarthritis are noted the first MTP joint.  IMPRESSION: 1.  No acute radiographic abnormality of the right foot. 2.  Large dorsal calcaneal spur redemonstrated.   Original Report Authenticated By: Trudie Reed, M.D.     No diagnosis found.    MDM  Teresa Faulkner is a 47 y.o. female here with back pain, heel pain. Likely worsening sciatica and worsening heel spurs. Will get R foot xray. No need for spinal imaging as she has no red flags.   3:56 AM R foot xray showed large calcaneal spur. Labs unremarkable, UA nl. Felt better after toradol, morphine, and valium. Will d/c home on motrin, percocet. I recommend podiatry f/u and PMD f/u for pain control.          Richardean Canal, MD 10/09/12 [phone]

## 2012-10-09 NOTE — ED Notes (Signed)
The pt is c/o pain all over her body for 3 days including abd pain

## 2012-10-09 NOTE — ED Notes (Signed)
lmp 3 months she hass had a tubal ligation

## 2012-10-30 ENCOUNTER — Encounter: Payer: Self-pay | Admitting: Obstetrics

## 2012-10-31 ENCOUNTER — Other Ambulatory Visit: Payer: Self-pay | Admitting: Internal Medicine

## 2012-10-31 DIAGNOSIS — Z1231 Encounter for screening mammogram for malignant neoplasm of breast: Secondary | ICD-10-CM

## 2012-11-21 ENCOUNTER — Ambulatory Visit
Admission: RE | Admit: 2012-11-21 | Discharge: 2012-11-21 | Disposition: A | Discharge status: Home / Self Care | Payer: Medicaid Other | Source: Ambulatory Visit | Attending: Internal Medicine | Admitting: Internal Medicine

## 2012-11-21 DIAGNOSIS — Z1231 Encounter for screening mammogram for malignant neoplasm of breast: Secondary | ICD-10-CM

## 2012-11-28 ENCOUNTER — Encounter (HOSPITAL_COMMUNITY): Payer: Self-pay | Admitting: *Deleted

## 2012-11-28 ENCOUNTER — Inpatient Hospital Stay (HOSPITAL_COMMUNITY)
Admission: AD | Admit: 2012-11-28 | Discharge: 2012-11-28 | Disposition: A | Discharge status: Home / Self Care | Payer: Medicaid Other | Source: Ambulatory Visit | Attending: Obstetrics & Gynecology | Admitting: Obstetrics & Gynecology

## 2012-11-28 DIAGNOSIS — A599 Trichomoniasis, unspecified: Secondary | ICD-10-CM

## 2012-11-28 DIAGNOSIS — R21 Rash and other nonspecific skin eruption: Secondary | ICD-10-CM | POA: Insufficient documentation

## 2012-11-28 DIAGNOSIS — A5901 Trichomonal vulvovaginitis: Secondary | ICD-10-CM | POA: Insufficient documentation

## 2012-11-28 LAB — URINALYSIS, ROUTINE W REFLEX MICROSCOPIC
Ketones, ur: NEGATIVE mg/dL
Leukocytes, UA: NEGATIVE
Nitrite: NEGATIVE
Specific Gravity, Urine: 1.025 (ref 1.005–1.030)
pH: 6.5 (ref 5.0–8.0)

## 2012-11-28 LAB — WET PREP, GENITAL

## 2012-11-28 LAB — POCT PREGNANCY, URINE: Preg Test, Ur: NEGATIVE

## 2012-11-28 LAB — URINE MICROSCOPIC-ADD ON

## 2012-11-28 MED ORDER — METRONIDAZOLE 500 MG PO TABS
2000.0000 mg | ORAL_TABLET | Freq: Once | ORAL | Status: AC
  Administered 2012-11-28: 2000 mg via ORAL
  Filled 2012-11-28: qty 4

## 2012-11-28 NOTE — MAU Note (Signed)
Pt reports she started having a rash on her feet that has spread to her legs and thighs. Has been on and off for  1 month. Pt also concerned her partner told her he had Trichomonas. Wants to be screened for that.

## 2012-11-28 NOTE — MAU Provider Note (Signed)
History     CSN: [id number]  Arrival date and time: 11/28/12 1616   First Provider Initiated Contact with Patient 11/28/12 1653      Chief Complaint  Patient presents with  . Rash   HPI Ms. Teresa Faulkner is a 47 y.o. [id number] who presents to MAU today with complaint of rash and trichomonas exposure. The patient states that she was recently told by a previous partner that he had trichomonas. She has had vaginal discharge with odor and some spotting recently. She has also had mild lower abdominal pain. She denies N/V, fever or UTI symptoms. She has had some loose stools over the past 2 days, but relates that to a food sensitivity.   OB History   Grav Para Term Preterm Abortions TAB SAB Ect Mult Living   2 2 2       2       Past Medical History  Diagnosis Date  . Arthritis     Past Surgical History  Procedure Laterality Date  . Back surgery    . Tubal ligation      No family history on file.  History  Substance Use Topics  . Smoking status: Current Every Day Smoker -- 1.00 packs/day  . Smokeless tobacco: Not on file  . Alcohol Use: No    Allergies: No Known Allergies  Prescriptions prior to admission  Medication Sig Dispense Refill  . omeprazole (PRILOSEC OTC) 20 MG tablet Take 20 mg by mouth daily.        Review of Systems  Constitutional: Negative for fever and malaise/fatigue.  Gastrointestinal: Positive for abdominal pain and diarrhea. Negative for nausea, vomiting and constipation.  Genitourinary: Negative for dysuria, urgency and frequency.       + vaginal discharge, bleeding   Physical Exam   Blood pressure 123/56, pulse 79, temperature 99 F (37.2 C), temperature source Oral, resp. rate 18, last menstrual period 07/06/2012.  Physical Exam  Constitutional: She is oriented to person, place, and time. She appears well-developed and well-nourished. No distress.  HENT:  Head: Normocephalic and atraumatic.  Cardiovascular: Normal rate, regular rhythm  and normal heart sounds.   Respiratory: Effort normal and breath sounds normal. No respiratory distress.  GI: Soft. Bowel sounds are normal. She exhibits no distension and no mass. There is tenderness (mild diffuse tenderness to palpation of the abdomen). There is no rebound and no guarding.  Neurological: She is alert and oriented to person, place, and time.  Skin: Skin is warm and dry. Rash (multiple small papules noted on the feet and lower leg, some evidence of excoriations. ) noted. No erythema.  Psychiatric: She has a normal mood and affect.   Results for orders placed during the hospital encounter of 11/28/12 (from the past 24 hour(s))  URINALYSIS, ROUTINE W REFLEX MICROSCOPIC     Status: Abnormal   Collection Time    11/28/12  4:45 PM      Result Value Range   Color, Urine YELLOW  YELLOW   APPearance CLEAR  CLEAR   Specific Gravity, Urine 1.025  1.005 - 1.030   pH 6.5  5.0 - 8.0   Glucose, UA NEGATIVE  NEGATIVE mg/dL   Hgb urine dipstick TRACE (*) NEGATIVE   Bilirubin Urine NEGATIVE  NEGATIVE   Ketones, ur NEGATIVE  NEGATIVE mg/dL   Protein, ur NEGATIVE  NEGATIVE mg/dL   Urobilinogen, UA 0.2  0.0 - 1.0 mg/dL   Nitrite NEGATIVE  NEGATIVE   Leukocytes, UA NEGATIVE  NEGATIVE  URINE MICROSCOPIC-ADD ON     Status: Abnormal   Collection Time    11/28/12  4:45 PM      Result Value Range   Squamous Epithelial / LPF FEW (*) RARE   RBC / HPF 0-2  <3 RBC/hpf   Urine-Other TRICHOMONAS PRESENT    POCT PREGNANCY, URINE     Status: None   Collection Time    11/28/12  4:48 PM      Result Value Range   Preg Test, Ur NEGATIVE  NEGATIVE  WET PREP, GENITAL     Status: Abnormal   Collection Time    11/28/12  5:00 PM      Result Value Range   Yeast Wet Prep HPF POC NONE SEEN  NONE SEEN   Trich, Wet Prep FEW (*) NONE SEEN   Clue Cells Wet Prep HPF POC FEW (*) NONE SEEN   WBC, Wet Prep HPF POC FEW (*) NONE SEEN    MAU Course  Procedures None  MDM UPT - negative UA, Wet prep and  GC/chlamydia today Will treat with 2G of Flagyl in MAU today  Assessment and Plan  A: Trichomonas Rash, unknown origin  P: Discharge home Patient treated in MAU today GC/Chlamydia pending Patient advised of partner treatment needed Patient referred to [facility] clinic to establish GYN care Patient instructed to follow-up with PCP for rash. Take Benadryl, Zyrtec for symptom management Patient may return to MAU as needed or if her condition were to change or worsen  Freddi Starr, PA-C  11/28/2012, 5:22 PM

## 2012-11-29 LAB — GC/CHLAMYDIA PROBE AMP: GC Probe RNA: NEGATIVE

## 2012-11-29 NOTE — MAU Provider Note (Signed)
Attestation of Attending Supervision of Advanced Practitioner (PA/CNM/NP): Evaluation and management procedures were performed by the Advanced Practitioner under my supervision and collaboration.  I have reviewed the Advanced Practitioner's note and chart, and I agree with the management and plan.  Eriberto Felch, MD, FACOG Attending Obstetrician & Gynecologist Faculty Practice, [facility]  

## 2012-12-02 ENCOUNTER — Encounter (HOSPITAL_COMMUNITY): Payer: Self-pay | Admitting: Emergency Medicine

## 2012-12-02 ENCOUNTER — Emergency Department (HOSPITAL_COMMUNITY)
Admission: EM | Admit: 2012-12-02 | Discharge: 2012-12-02 | Disposition: A | Discharge status: Home / Self Care | Payer: Medicaid Other | Source: Home / Self Care | Attending: Family Medicine | Admitting: Family Medicine

## 2012-12-02 DIAGNOSIS — L988 Other specified disorders of the skin and subcutaneous tissue: Secondary | ICD-10-CM

## 2012-12-02 DIAGNOSIS — N943 Premenstrual tension syndrome: Secondary | ICD-10-CM

## 2012-12-02 DIAGNOSIS — R238 Other skin changes: Secondary | ICD-10-CM

## 2012-12-02 LAB — POCT URINALYSIS DIP (DEVICE)
Bilirubin Urine: NEGATIVE
Glucose, UA: NEGATIVE mg/dL
Nitrite: NEGATIVE
Specific Gravity, Urine: 1.02 (ref 1.005–1.030)
Urobilinogen, UA: 2 mg/dL — ABNORMAL HIGH (ref 0.0–1.0)

## 2012-12-02 NOTE — ED Provider Notes (Signed)
  CSN: [id number]     Arrival date & time 12/02/12  1443 History     First MD Initiated Contact with Patient 12/02/12 1507     Chief Complaint  Patient presents with  . Rash  . Abdominal Pain   (Consider location/radiation/quality/duration/timing/severity/associated sxs/prior Treatment) Patient is a 47 y.o. female presenting with rash and abdominal pain. The history is provided by the patient.  Rash Pain severity:  No pain Onset quality:  Gradual Duration:  8 weeks Timing:  Constant Progression:  Worsening Chronicity:  New Associated symptoms: no vaginal bleeding and no vaginal discharge   Abdominal Pain This is a new problem. The current episode started 6 to 12 hours ago. The problem has been gradually worsening. Associated symptoms include abdominal pain. Associated symptoms comments: lmp in feb..    Past Medical History  Diagnosis Date  . Arthritis    Past Surgical History  Procedure Laterality Date  . Back surgery    . Tubal ligation     History reviewed. No pertinent family history. History  Substance Use Topics  . Smoking status: Current Every Day Smoker -- 1.00 packs/day  . Smokeless tobacco: Not on file  . Alcohol Use: No   OB History   Grav Para Term Preterm Abortions TAB SAB Ect Mult Living   2 2 2       2      Review of Systems  Constitutional: Negative.   Gastrointestinal: Positive for abdominal pain.  Genitourinary: Positive for menstrual problem and pelvic pain. Negative for vaginal bleeding and vaginal discharge.  Skin: Positive for rash.    Allergies  Review of patient's allergies indicates no known allergies.  Home Medications   Current Outpatient Rx  Name  Route  Sig  Dispense  Refill  . omeprazole (PRILOSEC OTC) 20 MG tablet   Oral   Take 20 mg by mouth daily.          BP 109/74  Pulse 85  Temp(Src) 98.8 F (37.1 C) (Oral)  Resp 16  SpO2 96%  LMP 07/06/2012 Physical Exam  Nursing note and vitals reviewed. Constitutional: She  is oriented to person, place, and time. She appears well-developed and well-nourished.  Abdominal: Soft. Bowel sounds are normal. She exhibits no distension and no mass. There is no hepatosplenomegaly. There is tenderness in the suprapubic area. There is no rebound, no guarding and no CVA tenderness.  Neurological: She is alert and oriented to person, place, and time.  Skin: Skin is warm and dry. Lesion and rash noted. Rash is papular and vesicular.       ED Course   Procedures (including critical care time)  Labs Reviewed  POCT URINALYSIS DIP (DEVICE) - Abnormal; Notable for the following:    Hgb urine dipstick TRACE (*)    pH 8.5 (*)    Urobilinogen, UA 2.0 (*)    All other components within normal limits   No results found. 1. Rash, vesicular   2. Premenstrual syndrome     MDM    Linna Hoff, MD 12/02/12 1538

## 2012-12-02 NOTE — ED Notes (Signed)
Patient states she has a rash on both feet that itches for two months now.  Using benadryl and lotion for itching but otherwise no treatment.  Patient states she is having some abd pain which started this morning.  Patient admits to having discharge and was told she has trichomonas on last Wednesday.

## 2012-12-02 NOTE — Discharge Instructions (Signed)
See dermatologist about rash and go to [facility] or see your gynecologist if pelvic pains continue.

## 2013-01-08 ENCOUNTER — Ambulatory Visit (INDEPENDENT_AMBULATORY_CARE_PROVIDER_SITE_OTHER): Payer: Medicaid Other | Admitting: Obstetrics and Gynecology

## 2013-01-08 ENCOUNTER — Other Ambulatory Visit (HOSPITAL_COMMUNITY)
Admission: RE | Admit: 2013-01-08 | Discharge: 2013-01-08 | Disposition: A | Discharge status: Home / Self Care | Payer: Medicaid Other | Source: Ambulatory Visit | Attending: Obstetrics and Gynecology | Admitting: Obstetrics and Gynecology

## 2013-01-08 ENCOUNTER — Encounter: Payer: Self-pay | Admitting: Obstetrics and Gynecology

## 2013-01-08 VITALS — BP 113/70 | HR 81 | Temp 97.1°F | Ht 67.0 in | Wt 200.4 lb

## 2013-01-08 DIAGNOSIS — Z01419 Encounter for gynecological examination (general) (routine) without abnormal findings: Secondary | ICD-10-CM

## 2013-01-08 DIAGNOSIS — Z1151 Encounter for screening for human papillomavirus (HPV): Secondary | ICD-10-CM | POA: Insufficient documentation

## 2013-01-08 NOTE — Patient Instructions (Signed)
Preventive Care for Adults, Female A healthy lifestyle and preventive care can promote health and wellness. Preventive health guidelines for women include the following key practices.  A routine yearly physical is a good way to check with your caregiver about your health and preventive screening. It is a chance to share any concerns and updates on your health, and to receive a thorough exam.  Visit your dentist for a routine exam and preventive care every 6 months. Brush your teeth twice a day and floss once a day. Good oral hygiene prevents tooth decay and gum disease.  The frequency of eye exams is based on your age, health, family medical history, use of contact lenses, and other factors. Follow your caregiver's recommendations for frequency of eye exams.  Eat a healthy diet. Foods like vegetables, fruits, whole grains, low-fat dairy products, and lean protein foods contain the nutrients you need without too many calories. Decrease your intake of foods high in solid fats, added sugars, and salt. Eat the right amount of calories for you.Get information about a proper diet from your caregiver, if necessary.  Regular physical exercise is one of the most important things you can do for your health. Most adults should get at least 150 minutes of moderate-intensity exercise (any activity that increases your heart rate and causes you to sweat) each week. In addition, most adults need muscle-strengthening exercises on 2 or more days a week.  Maintain a healthy weight. The body mass index (BMI) is a screening tool to identify possible weight problems. It provides an estimate of body fat based on height and weight. Your caregiver can help determine your BMI, and can help you achieve or maintain a healthy weight.For adults 20 years and older:  A BMI below 18.5 is considered underweight.  A BMI of 18.5 to 24.9 is normal.  A BMI of 25 to 29.9 is considered overweight.  A BMI of 30 and above is  considered obese.  Maintain normal blood lipids and cholesterol levels by exercising and minimizing your intake of saturated fat. Eat a balanced diet with plenty of fruit and vegetables. Blood tests for lipids and cholesterol should begin at age 20 and be repeated every 5 years. If your lipid or cholesterol levels are high, you are over 50, or you are at high risk for heart disease, you may need your cholesterol levels checked more frequently.Ongoing high lipid and cholesterol levels should be treated with medicines if diet and exercise are not effective.  If you smoke, find out from your caregiver how to quit. If you do not use tobacco, do not start.  If you are pregnant, do not drink alcohol. If you are breastfeeding, be very cautious about drinking alcohol. If you are not pregnant and choose to drink alcohol, do not exceed 1 drink per day. One drink is considered to be 12 ounces (355 mL) of beer, 5 ounces (148 mL) of wine, or 1.5 ounces (44 mL) of liquor.  Avoid use of street drugs. Do not share needles with anyone. Ask for help if you need support or instructions about stopping the use of drugs.  High blood pressure causes heart disease and increases the risk of stroke. Your blood pressure should be checked at least every 1 to 2 years. Ongoing high blood pressure should be treated with medicines if weight loss and exercise are not effective.  If you are 55 to 46 years old, ask your caregiver if you should take aspirin to prevent strokes.  Diabetes   screening involves taking a blood sample to check your fasting blood sugar level. This should be done once every 3 years, after age 45, if you are within normal weight and without risk factors for diabetes. Testing should be considered at a younger age or be carried out more frequently if you are overweight and have at least 1 risk factor for diabetes.  Breast cancer screening is essential preventive care for women. You should practice "breast  self-awareness." This means understanding the normal appearance and feel of your breasts and may include breast self-examination. Any changes detected, no matter how small, should be reported to a caregiver. Women in their 20s and 30s should have a clinical breast exam (CBE) by a caregiver as part of a regular health exam every 1 to 3 years. After age 40, women should have a CBE every year. Starting at age 40, women should consider having a mammography (breast X-ray test) every year. Women who have a family history of breast cancer should talk to their caregiver about genetic screening. Women at a high risk of breast cancer should talk to their caregivers about having magnetic resonance imaging (MRI) and a mammography every year.  The Pap test is a screening test for cervical cancer. A Pap test can show cell changes on the cervix that might become cervical cancer if left untreated. A Pap test is a procedure in which cells are obtained and examined from the lower end of the uterus (cervix).  Women should have a Pap test starting at age 21.  Between ages 21 and 29, Pap tests should be repeated every 2 years.  Beginning at age 30, you should have a Pap test every 3 years as long as the past 3 Pap tests have been normal.  Some women have medical problems that increase the chance of getting cervical cancer. Talk to your caregiver about these problems. It is especially important to talk to your caregiver if a new problem develops soon after your last Pap test. In these cases, your caregiver may recommend more frequent screening and Pap tests.  The above recommendations are the same for women who have or have not gotten the vaccine for human papillomavirus (HPV).  If you had a hysterectomy for a problem that was not cancer or a condition that could lead to cancer, then you no longer need Pap tests. Even if you no longer need a Pap test, a regular exam is a good idea to make sure no other problems are  starting.  If you are between ages 65 and 70, and you have had normal Pap tests going back 10 years, you no longer need Pap tests. Even if you no longer need a Pap test, a regular exam is a good idea to make sure no other problems are starting.  If you have had past treatment for cervical cancer or a condition that could lead to cancer, you need Pap tests and screening for cancer for at least 20 years after your treatment.  If Pap tests have been discontinued, risk factors (such as a new sexual partner) need to be reassessed to determine if screening should be resumed.  The HPV test is an additional test that may be used for cervical cancer screening. The HPV test looks for the virus that can cause the cell changes on the cervix. The cells collected during the Pap test can be tested for HPV. The HPV test could be used to screen women aged 30 years and older, and should   be used in women of any age who have unclear Pap test results. After the age of 30, women should have HPV testing at the same frequency as a Pap test.  Colorectal cancer can be detected and often prevented. Most routine colorectal cancer screening begins at the age of 50 and continues through age 75. However, your caregiver may recommend screening at an earlier age if you have risk factors for colon cancer. On a yearly basis, your caregiver may provide home test kits to check for hidden blood in the stool. Use of a small camera at the end of a tube, to directly examine the colon (sigmoidoscopy or colonoscopy), can detect the earliest forms of colorectal cancer. Talk to your caregiver about this at age 50, when routine screening begins. Direct examination of the colon should be repeated every 5 to 10 years through age 75, unless early forms of pre-cancerous polyps or small growths are found.  Hepatitis C blood testing is recommended for all people born from 1945 through 1965 and any individual with known risks for hepatitis C.  Practice  safe sex. Use condoms and avoid high-risk sexual practices to reduce the spread of sexually transmitted infections (STIs). STIs include gonorrhea, chlamydia, syphilis, trichomonas, herpes, HPV, and human immunodeficiency virus (HIV). Herpes, HIV, and HPV are viral illnesses that have no cure. They can result in disability, cancer, and death. Sexually active women aged 25 and younger should be checked for chlamydia. Older women with new or multiple partners should also be tested for chlamydia. Testing for other STIs is recommended if you are sexually active and at increased risk.  Osteoporosis is a disease in which the bones lose minerals and strength with aging. This can result in serious bone fractures. The risk of osteoporosis can be identified using a bone density scan. Women ages 65 and over and women at risk for fractures or osteoporosis should discuss screening with their caregivers. Ask your caregiver whether you should take a calcium supplement or vitamin D to reduce the rate of osteoporosis.  Menopause can be associated with physical symptoms and risks. Hormone replacement therapy is available to decrease symptoms and risks. You should talk to your caregiver about whether hormone replacement therapy is right for you.  Use sunscreen with sun protection factor (SPF) of 30 or more. Apply sunscreen liberally and repeatedly throughout the day. You should seek shade when your shadow is shorter than you. Protect yourself by wearing long sleeves, pants, a wide-brimmed hat, and sunglasses year round, whenever you are outdoors.  Once a month, do a whole body skin exam, using a mirror to look at the skin on your back. Notify your caregiver of new moles, moles that have irregular borders, moles that are larger than a pencil eraser, or moles that have changed in shape or color.  Stay current with required immunizations.  Influenza. You need a dose every fall (or winter). The composition of the flu vaccine  changes each year, so being vaccinated once is not enough.  Pneumococcal polysaccharide. You need 1 to 2 doses if you smoke cigarettes or if you have certain chronic medical conditions. You need 1 dose at age 65 (or older) if you have never been vaccinated.  Tetanus, diphtheria, pertussis (Tdap, Td). Get 1 dose of Tdap vaccine if you are younger than age 65, are over 65 and have contact with an infant, are a healthcare worker, are pregnant, or simply want to be protected from whooping cough. After that, you need a Td   booster dose every 10 years. Consult your caregiver if you have not had at least 3 tetanus and diphtheria-containing shots sometime in your life or have a deep or dirty wound.  HPV. You need this vaccine if you are a woman age 26 or younger. The vaccine is given in 3 doses over 6 months.  Measles, mumps, rubella (MMR). You need at least 1 dose of MMR if you were born in 1957 or later. You may also need a second dose.  Meningococcal. If you are age 19 to 21 and a first-year college student living in a residence hall, or have one of several medical conditions, you need to get vaccinated against meningococcal disease. You may also need additional booster doses.  Zoster (shingles). If you are age 60 or older, you should get this vaccine.  Varicella (chickenpox). If you have never had chickenpox or you were vaccinated but received only 1 dose, talk to your caregiver to find out if you need this vaccine.  Hepatitis A. You need this vaccine if you have a specific risk factor for hepatitis A virus infection or you simply wish to be protected from this disease. The vaccine is usually given as 2 doses, 6 to 18 months apart.  Hepatitis B. You need this vaccine if you have a specific risk factor for hepatitis B virus infection or you simply wish to be protected from this disease. The vaccine is given in 3 doses, usually over 6 months. Preventive Services / Frequency Ages 19 to 39  Blood  pressure check.** / Every 1 to 2 years.  Lipid and cholesterol check.** / Every 5 years beginning at age 20.  Clinical breast exam.** / Every 3 years for women in their 20s and 30s.  Pap test.** / Every 2 years from ages 21 through 29. Every 3 years starting at age 30 through age 65 or 70 with a history of 3 consecutive normal Pap tests.  HPV screening.** / Every 3 years from ages 30 through ages 65 to 70 with a history of 3 consecutive normal Pap tests.  Hepatitis C blood test.** / For any individual with known risks for hepatitis C.  Skin self-exam. / Monthly.  Influenza immunization.** / Every year.  Pneumococcal polysaccharide immunization.** / 1 to 2 doses if you smoke cigarettes or if you have certain chronic medical conditions.  Tetanus, diphtheria, pertussis (Tdap, Td) immunization. / A one-time dose of Tdap vaccine. After that, you need a Td booster dose every 10 years.  HPV immunization. / 3 doses over 6 months, if you are 26 and younger.  Measles, mumps, rubella (MMR) immunization. / You need at least 1 dose of MMR if you were born in 1957 or later. You may also need a second dose.  Meningococcal immunization. / 1 dose if you are age 19 to 21 and a first-year college student living in a residence hall, or have one of several medical conditions, you need to get vaccinated against meningococcal disease. You may also need additional booster doses.  Varicella immunization.** / Consult your caregiver.  Hepatitis A immunization.** / Consult your caregiver. 2 doses, 6 to 18 months apart.  Hepatitis B immunization.** / Consult your caregiver. 3 doses usually over 6 months. Ages 40 to 64  Blood pressure check.** / Every 1 to 2 years.  Lipid and cholesterol check.** / Every 5 years beginning at age 20.  Clinical breast exam.** / Every year after age 40.  Mammogram.** / Every year beginning at age 40   and continuing for as long as you are in good health. Consult with your  caregiver.  Pap test.** / Every 3 years starting at age 30 through age 65 or 70 with a history of 3 consecutive normal Pap tests.  HPV screening.** / Every 3 years from ages 30 through ages 65 to 70 with a history of 3 consecutive normal Pap tests.  Fecal occult blood test (FOBT) of stool. / Every year beginning at age 50 and continuing until age 75. You may not need to do this test if you get a colonoscopy every 10 years.  Flexible sigmoidoscopy or colonoscopy.** / Every 5 years for a flexible sigmoidoscopy or every 10 years for a colonoscopy beginning at age 50 and continuing until age 75.  Hepatitis C blood test.** / For all people born from 1945 through 1965 and any individual with known risks for hepatitis C.  Skin self-exam. / Monthly.  Influenza immunization.** / Every year.  Pneumococcal polysaccharide immunization.** / 1 to 2 doses if you smoke cigarettes or if you have certain chronic medical conditions.  Tetanus, diphtheria, pertussis (Tdap, Td) immunization.** / A one-time dose of Tdap vaccine. After that, you need a Td booster dose every 10 years.  Measles, mumps, rubella (MMR) immunization. / You need at least 1 dose of MMR if you were born in 1957 or later. You may also need a second dose.  Varicella immunization.** / Consult your caregiver.  Meningococcal immunization.** / Consult your caregiver.  Hepatitis A immunization.** / Consult your caregiver. 2 doses, 6 to 18 months apart.  Hepatitis B immunization.** / Consult your caregiver. 3 doses, usually over 6 months. Ages 65 and over  Blood pressure check.** / Every 1 to 2 years.  Lipid and cholesterol check.** / Every 5 years beginning at age 20.  Clinical breast exam.** / Every year after age 40.  Mammogram.** / Every year beginning at age 40 and continuing for as long as you are in good health. Consult with your caregiver.  Pap test.** / Every 3 years starting at age 30 through age 65 or 70 with a 3  consecutive normal Pap tests. Testing can be stopped between 65 and 70 with 3 consecutive normal Pap tests and no abnormal Pap or HPV tests in the past 10 years.  HPV screening.** / Every 3 years from ages 30 through ages 65 or 70 with a history of 3 consecutive normal Pap tests. Testing can be stopped between 65 and 70 with 3 consecutive normal Pap tests and no abnormal Pap or HPV tests in the past 10 years.  Fecal occult blood test (FOBT) of stool. / Every year beginning at age 50 and continuing until age 75. You may not need to do this test if you get a colonoscopy every 10 years.  Flexible sigmoidoscopy or colonoscopy.** / Every 5 years for a flexible sigmoidoscopy or every 10 years for a colonoscopy beginning at age 50 and continuing until age 75.  Hepatitis C blood test.** / For all people born from 1945 through 1965 and any individual with known risks for hepatitis C.  Osteoporosis screening.** / A one-time screening for women ages 65 and over and women at risk for fractures or osteoporosis.  Skin self-exam. / Monthly.  Influenza immunization.** / Every year.  Pneumococcal polysaccharide immunization.** / 1 dose at age 65 (or older) if you have never been vaccinated.  Tetanus, diphtheria, pertussis (Tdap, Td) immunization. / A one-time dose of Tdap vaccine if you are over   65 and have contact with an infant, are a healthcare worker, or simply want to be protected from whooping cough. After that, you need a Td booster dose every 10 years.  Varicella immunization.** / Consult your caregiver.  Meningococcal immunization.** / Consult your caregiver.  Hepatitis A immunization.** / Consult your caregiver. 2 doses, 6 to 18 months apart.  Hepatitis B immunization.** / Check with your caregiver. 3 doses, usually over 6 months. ** Family history and personal history of risk and conditions may change your caregiver's recommendations. Document Released: 06/20/2001 Document Revised: 07/17/2011  Document Reviewed: 09/19/2010 [employer] Patient Information 2014 [employer].  

## 2013-01-08 NOTE — Addendum Note (Signed)
Addended by: Franchot Mimes on: 01/08/2013 02:00 PM   Modules accepted: Orders

## 2013-01-08 NOTE — Progress Notes (Signed)
  Subjective:     Teresa Faulkner is a 47 y.o. female G2P2 with LMP 12/06/2012 who is here for a comprehensive physical exam. The patient reports no problems. Patient is sexually active using BTL for contraception.  History   Social History  . Marital Status: Single    Spouse Name: N/A    Number of Children: N/A  . Years of Education: N/A   Occupational History  . Not on file.   Social History Main Topics  . Smoking status: Current Every Day Smoker -- 0.25 packs/day for 20 years    Types: Cigarettes  . Smokeless tobacco: Never Used  . Alcohol Use: No  . Drug Use: No  . Sexual Activity: Not Currently    Birth Control/ Protection: Surgical   Other Topics Concern  . Not on file   Social History Narrative  . No narrative on file   Past Medical History  Diagnosis Date  . Arthritis   . GERD (gastroesophageal reflux disease)   . Bursitis    Past Surgical History  Procedure Laterality Date  . Back surgery    . Tubal ligation     Family History  Problem Relation Age of Onset  . Diabetes Mother   . Diabetes Maternal Aunt   . Hypertension Maternal Aunt    History  Substance Use Topics  . Smoking status: Current Every Day Smoker -- 0.25 packs/day for 20 years    Types: Cigarettes  . Smokeless tobacco: Never Used  . Alcohol Use: No    No health maintenance topics applied.     Review of Systems A comprehensive review of systems was negative.   Objective:      GENERAL: Well-developed, well-nourished female in no acute distress.  HEENT: Normocephalic, atraumatic. Sclerae anicteric.  NECK: Supple. Normal thyroid.  LUNGS: Clear to auscultation bilaterally.  HEART: Regular rate and rhythm. BREASTS: Symmetric in size. No palpable masses or lymphadenopathy, skin changes, or nipple drainage. ABDOMEN: Soft, nontender, nondistended. No organomegaly. PELVIC: Normal external female genitalia. Vagina is pink and rugated.  Normal discharge. Normal appearing cervix. Uterus is  normal in size. No adnexal mass or tenderness. EXTREMITIES: No cyanosis, clubbing, or edema, 2+ distal pulses.    Assessment:    Healthy female exam.      Plan:    Pap smear collected Patient with normal mammogram this year Patient advised to perform monthly breast and vulva exam Patient will be contacted with any abnormal results See After Visit Summary for Counseling Recommendations

## 2013-01-10 LAB — URINE CULTURE

## 2013-02-07 ENCOUNTER — Encounter (HOSPITAL_COMMUNITY): Payer: Self-pay | Admitting: *Deleted

## 2013-02-07 ENCOUNTER — Emergency Department (HOSPITAL_COMMUNITY)
Admission: EM | Admit: 2013-02-07 | Discharge: 2013-02-07 | Disposition: A | Discharge status: Home / Self Care | Payer: Medicaid Other | Attending: Emergency Medicine | Admitting: Emergency Medicine

## 2013-02-07 DIAGNOSIS — L508 Other urticaria: Secondary | ICD-10-CM

## 2013-02-07 DIAGNOSIS — L299 Pruritus, unspecified: Secondary | ICD-10-CM | POA: Insufficient documentation

## 2013-02-07 DIAGNOSIS — F17200 Nicotine dependence, unspecified, uncomplicated: Secondary | ICD-10-CM | POA: Insufficient documentation

## 2013-02-07 DIAGNOSIS — K219 Gastro-esophageal reflux disease without esophagitis: Secondary | ICD-10-CM | POA: Insufficient documentation

## 2013-02-07 DIAGNOSIS — L509 Urticaria, unspecified: Secondary | ICD-10-CM | POA: Insufficient documentation

## 2013-02-07 DIAGNOSIS — Z79899 Other long term (current) drug therapy: Secondary | ICD-10-CM | POA: Insufficient documentation

## 2013-02-07 DIAGNOSIS — F411 Generalized anxiety disorder: Secondary | ICD-10-CM | POA: Insufficient documentation

## 2013-02-07 DIAGNOSIS — M129 Arthropathy, unspecified: Secondary | ICD-10-CM | POA: Insufficient documentation

## 2013-02-07 HISTORY — DX: Anxiety disorder, unspecified: F41.9

## 2013-02-07 MED ORDER — HYDROXYZINE HCL 25 MG PO TABS: 25.0000 mg | ORAL_TABLET | Freq: Four times a day (QID) | ORAL | Status: DC

## 2013-02-07 MED ORDER — PREDNISONE 20 MG PO TABS
40.0000 mg | ORAL_TABLET | Freq: Once | ORAL | Status: AC
  Administered 2013-02-07: 40 mg via ORAL
  Filled 2013-02-07: qty 2

## 2013-02-07 MED ORDER — PREDNISONE 20 MG PO TABS: 20.0000 mg | ORAL_TABLET | Freq: Two times a day (BID) | ORAL | Status: DC

## 2013-02-07 NOTE — ED Notes (Signed)
Pt reports a rash that began on bilat feet in august, rash has spread up her legs and is not generalized to trunk and all extremities, pt admits to pruritis as well. Pt denies any fever or cough, admits to HA.

## 2013-02-08 NOTE — ED Provider Notes (Signed)
CSN: [id number]     Arrival date & time 02/07/13  1956 History   First MD Initiated Contact with Patient 02/07/13 2049     Chief Complaint  Patient presents with  . Rash  . Pruritis   (Consider location/radiation/quality/duration/timing/severity/associated sxs/prior Treatment) HPI History provided by pt and prior chart.  Pt reports rash x 1.5-2 months.  Started on feet and has gradually spread up his legs.  Has noticed a few lesions on L arm today.  Rash severely pruritic and no relief w/ po or topical benadryl.  The lesions blister after a short time and then scar, turning a dark brownish-black color.  No lesions in mouth.  No known allergies or new contacts.  Has not noticed any bed bugs in her home.  Her son has a few lesions but they have a different appearance.   Per prior chart, pt has been seen for this problem at [facility].  Reported rash x 8 weeks in 11/2012.  Etiology not determined, was referred to dermatology but she was unable to f/u b/c insurance not accepted.  Past Medical History  Diagnosis Date  . Arthritis   . GERD (gastroesophageal reflux disease)   . Bursitis   . Anxiety    Past Surgical History  Procedure Laterality Date  . Back surgery    . Tubal ligation     Family History  Problem Relation Age of Onset  . Diabetes Mother   . Diabetes Maternal Aunt   . Hypertension Maternal Aunt    History  Substance Use Topics  . Smoking status: Current Every Day Smoker -- 0.25 packs/day for 20 years    Types: Cigarettes  . Smokeless tobacco: Never Used  . Alcohol Use: No   OB History   Grav Para Term Preterm Abortions TAB SAB Ect Mult Living   2 2 2  0 0 0 0 0 0 2     Review of Systems  All other systems reviewed and are negative.    Allergies  Review of patient's allergies indicates no known allergies.  Home Medications   Current Outpatient Rx  Name  Route  Sig  Dispense  Refill  . Ascorbic Acid (VITAMIN C PO)   Oral   Take 1 tablet by mouth daily.          . diphenhydrAMINE (SOMINEX) 25 MG tablet   Oral   Take 25 mg by mouth daily.         Marland Kitchen ibuprofen (ADVIL,MOTRIN) 200 MG tablet   Oral   Take 200 mg by mouth every 6 (six) hours as needed for pain.         . Pantoprazole Sodium (PROTONIX PO)   Oral   Take 1 tablet by mouth daily.         . hydrOXYzine (ATARAX/VISTARIL) 25 MG tablet   Oral   Take 1 tablet (25 mg total) by mouth every 6 (six) hours.   20 tablet   0   . predniSONE (DELTASONE) 20 MG tablet   Oral   Take 1 tablet (20 mg total) by mouth 2 (two) times daily.   10 tablet   0    BP 133/77  Pulse 72  Temp(Src) 98.7 F (37.1 C) (Oral)  Resp 20  SpO2 96%  LMP 02/02/2013 Physical Exam  Nursing note and vitals reviewed. Constitutional: She is oriented to person, place, and time. She appears well-developed and well-nourished. No distress.  HENT:  Head: Normocephalic and atraumatic.  No edema lips/tongue.  No lesions buccal mucosa  Eyes:  Normal appearance  Neck: Normal range of motion.  Cardiovascular: Normal rate and regular rhythm.   Pulmonary/Chest: Effort normal and breath sounds normal. No stridor. No respiratory distress.  Musculoskeletal: Normal range of motion.  Diffuse, discrete, randomly scattered, 59mm-0.5cm round, papular lesions w/ central punctum, most concentrated dorsal surface bilateral feet and calves.   Most are skin-colored but some brownish-black.  Pt reports that these are the oldest and seem to be scarred from scratching.  No lesions on palms/soles or face.  Neurological: She is alert and oriented to person, place, and time.  Skin: Skin is warm and dry. No rash noted.  Psychiatric: She has a normal mood and affect. Her behavior is normal.    ED Course  Procedures (including critical care time) Labs Review Labs Reviewed - No data to display Imaging Review No results found.  MDM   1. Chronic urticaria    47yo immunocompetent F presents w/ pruritic rash x several months.  Started  on feet, is most concentrated on feet and posterior aspect of BLE, and has gradually spread upwards.  Noticed lesions on LUE today.  Son has pruritic lesions as well.  Suspect that patient is having a histamine reaction to bites, possibly bed bugs.  She has not had any relief w/ po/topical benadryl.  I prescribed 5d course of prednisone + atarax and advised avoidance of scratching and f/u w/ dermatology.  Also recommended that she wash all bedding in hot water.  Return precautions discussed.   Otilio Miu, PA-C 02/08/13 2125  Otilio Miu, PA-C 02/08/13 2125

## 2013-02-09 NOTE — ED Provider Notes (Signed)
Medical screening examination/treatment/procedure(s) were performed by non-physician practitioner and as supervising physician I was immediately available for consultation/collaboration.  Darlys Gales, MD 02/09/13 [phone]

## 2013-02-18 ENCOUNTER — Other Ambulatory Visit: Payer: Self-pay | Admitting: Internal Medicine

## 2013-02-18 DIAGNOSIS — E049 Nontoxic goiter, unspecified: Secondary | ICD-10-CM

## 2013-02-21 ENCOUNTER — Ambulatory Visit
Admission: RE | Admit: 2013-02-21 | Discharge: 2013-02-21 | Disposition: A | Discharge status: Home / Self Care | Payer: Medicaid Other | Source: Ambulatory Visit | Attending: Internal Medicine | Admitting: Internal Medicine

## 2013-02-21 DIAGNOSIS — E049 Nontoxic goiter, unspecified: Secondary | ICD-10-CM

## 2013-03-13 ENCOUNTER — Other Ambulatory Visit: Payer: Self-pay

## 2013-04-14 ENCOUNTER — Encounter (HOSPITAL_COMMUNITY): Payer: Self-pay | Admitting: Emergency Medicine

## 2013-04-14 ENCOUNTER — Emergency Department (HOSPITAL_COMMUNITY)
Admission: EM | Admit: 2013-04-14 | Discharge: 2013-04-14 | Disposition: A | Discharge status: Home / Self Care | Payer: Medicaid Other | Attending: Emergency Medicine | Admitting: Emergency Medicine

## 2013-04-14 DIAGNOSIS — K219 Gastro-esophageal reflux disease without esophagitis: Secondary | ICD-10-CM | POA: Insufficient documentation

## 2013-04-14 DIAGNOSIS — J069 Acute upper respiratory infection, unspecified: Secondary | ICD-10-CM | POA: Insufficient documentation

## 2013-04-14 DIAGNOSIS — G479 Sleep disorder, unspecified: Secondary | ICD-10-CM | POA: Insufficient documentation

## 2013-04-14 DIAGNOSIS — F411 Generalized anxiety disorder: Secondary | ICD-10-CM | POA: Insufficient documentation

## 2013-04-14 DIAGNOSIS — R0981 Nasal congestion: Secondary | ICD-10-CM

## 2013-04-14 DIAGNOSIS — Z79899 Other long term (current) drug therapy: Secondary | ICD-10-CM | POA: Insufficient documentation

## 2013-04-14 DIAGNOSIS — F17200 Nicotine dependence, unspecified, uncomplicated: Secondary | ICD-10-CM | POA: Insufficient documentation

## 2013-04-14 DIAGNOSIS — M129 Arthropathy, unspecified: Secondary | ICD-10-CM | POA: Insufficient documentation

## 2013-04-14 DIAGNOSIS — R51 Headache: Secondary | ICD-10-CM | POA: Insufficient documentation

## 2013-04-14 MED ORDER — PSEUDOEPHEDRINE HCL ER 120 MG PO TB12: 120.0000 mg | ORAL_TABLET | Freq: Two times a day (BID) | ORAL | Status: DC | PRN

## 2013-04-14 MED ORDER — PSEUDOEPHEDRINE HCL 60 MG PO TABS
60.0000 mg | ORAL_TABLET | Freq: Once | ORAL | Status: AC
  Administered 2013-04-14: 60 mg via ORAL
  Filled 2013-04-14: qty 1

## 2013-04-14 NOTE — ED Notes (Signed)
Pt. reports nasal congestion since Friday with slight headache .

## 2013-04-14 NOTE — ED Provider Notes (Signed)
CSN: [id number]     Arrival date & time 04/14/13  2032 History   First MD Initiated Contact with Patient 04/14/13 2152     Chief Complaint  Patient presents with  . Nasal Congestion   (Consider location/radiation/quality/duration/timing/severity/associated sxs/prior Treatment) Patient is a 47 y.o. female presenting with URI.  URI Presenting symptoms: congestion, cough and facial pain   Presenting symptoms: no ear pain, no fatigue, no fever, no rhinorrhea and no sore throat   Congestion:    Location:  Nasal   Interferes with sleep: yes     Interferes with eating/drinking: no   Severity:  Moderate Duration:  3 days Timing:  Constant Progression:  Worsening Chronicity:  New Relieved by:  Nothing Worsened by:  Nothing tried Ineffective treatments:  OTC medications Associated symptoms: headaches, sinus pain and sneezing   Associated symptoms: no arthralgias, no myalgias, no neck pain, no swollen glands and no wheezing   Headaches:    Severity:  Mild   Progression:  Waxing and waning   Chronicity:  New Risk factors: sick contacts   Risk factors: no recent illness and no recent travel     Past Medical History  Diagnosis Date  . Arthritis   . GERD (gastroesophageal reflux disease)   . Bursitis   . Anxiety    Past Surgical History  Procedure Laterality Date  . Back surgery    . Tubal ligation     Family History  Problem Relation Age of Onset  . Diabetes Mother   . Diabetes Maternal Aunt   . Hypertension Maternal Aunt    History  Substance Use Topics  . Smoking status: Current Every Day Smoker -- 0.25 packs/day for 20 years    Types: Cigarettes  . Smokeless tobacco: Never Used  . Alcohol Use: No   OB History   Grav Para Term Preterm Abortions TAB SAB Ect Mult Living   2 2 2  0 0 0 0 0 0 2     Review of Systems  Constitutional: Negative for fever, chills, diaphoresis, activity change, appetite change and fatigue.  HENT: Positive for congestion and sneezing.  Negative for ear pain, facial swelling, rhinorrhea and sore throat.   Eyes: Negative for photophobia and discharge.  Respiratory: Positive for cough. Negative for chest tightness, shortness of breath and wheezing.   Cardiovascular: Negative for chest pain, palpitations and leg swelling.  Gastrointestinal: Negative for nausea, vomiting, abdominal pain and diarrhea.  Endocrine: Negative for polydipsia and polyuria.  Genitourinary: Negative for dysuria, frequency, difficulty urinating and pelvic pain.  Musculoskeletal: Negative for arthralgias, back pain, myalgias, neck pain and neck stiffness.  Skin: Negative for color change and wound.  Allergic/Immunologic: Negative for immunocompromised state.  Neurological: Positive for headaches. Negative for facial asymmetry, weakness and numbness.  Hematological: Does not bruise/bleed easily.  Psychiatric/Behavioral: Negative for confusion and agitation.    Allergies  Review of patient's allergies indicates no known allergies.  Home Medications   Current Outpatient Rx  Name  Route  Sig  Dispense  Refill  . Ascorbic Acid (VITAMIN C PO)   Oral   Take 1 tablet by mouth daily.         . diphenhydrAMINE (SOMINEX) 25 MG tablet   Oral   Take 25 mg by mouth daily.         . hydrOXYzine (ATARAX/VISTARIL) 25 MG tablet   Oral   Take 1 tablet (25 mg total) by mouth every 6 (six) hours.   20 tablet   0   .  ibuprofen (ADVIL,MOTRIN) 200 MG tablet   Oral   Take 200 mg by mouth every 6 (six) hours as needed for pain.         . Pantoprazole Sodium (PROTONIX PO)   Oral   Take 1 tablet by mouth daily.         . pseudoephedrine (SUDAFED 12 HOUR) 120 MG 12 hr tablet   Oral   Take 1 tablet (120 mg total) by mouth every 12 (twelve) hours as needed for congestion.   15 tablet   0    BP 154/83  Pulse 87  Temp(Src) 98.1 F (36.7 C) (Oral)  Resp 16  Ht 5\' 7"  (1.702 m)  Wt 207 lb 8 oz (94.121 kg)  BMI 32.49 kg/m2  SpO2 97% Physical Exam   Constitutional: She is oriented to person, place, and time. She appears well-developed and well-nourished. No distress.  HENT:  Head: Normocephalic and atraumatic.  Mouth/Throat: No oropharyngeal exudate.  Eyes: Pupils are equal, round, and reactive to light.  Neck: Normal range of motion. Neck supple.  Cardiovascular: Normal rate, regular rhythm and normal heart sounds.  Exam reveals no gallop and no friction rub.   No murmur heard. Pulmonary/Chest: Effort normal and breath sounds normal. No respiratory distress. She has no wheezes. She has no rales.  Abdominal: Soft. Bowel sounds are normal. She exhibits no distension and no mass. There is no tenderness. There is no rebound and no guarding.  Musculoskeletal: Normal range of motion. She exhibits no edema and no tenderness.  Neurological: She is alert and oriented to person, place, and time.  Skin: Skin is warm and dry.  Psychiatric: She has a normal mood and affect.    ED Course  Procedures (including critical care time) Labs Review Labs Reviewed - No data to display Imaging Review No results found.  EKG Interpretation   None       MDM   1. Viral URI   2. Nasal congestion    Pt is a 47 y.o. female with Pmhx as above who presents with 3 days of sneezing, nasal congestion, mild cough, and difficulty sleeping sue to symptoms.  No fever, myalgias, n/v, d/a.  No SOB when breathing through mouth. Symptoms not relieved by nyquil or Vicks.  +sick contacts.  On PE, VSS, pt in NAD.  Cardiopulm, abdominal, ear, oropharyngeal exam benign.  I suspect viral URI.  Have recommended pseudoephedrine for nasal congestion, and benadryl for nighttime symptoms/sleep.  Return precautions given for new or worsening symptoms including worsening SOB (through mouth as well, inability to tolerate PO.     Shanna Cisco, MD 04/14/13 2224

## 2013-05-10 ENCOUNTER — Encounter (HOSPITAL_COMMUNITY): Payer: Self-pay | Admitting: Emergency Medicine

## 2013-05-10 ENCOUNTER — Emergency Department (HOSPITAL_COMMUNITY)
Admission: EM | Admit: 2013-05-10 | Discharge: 2013-05-10 | Discharge status: Against Medical Advice | Payer: Medicaid Other | Attending: Emergency Medicine | Admitting: Emergency Medicine

## 2013-05-10 DIAGNOSIS — R109 Unspecified abdominal pain: Secondary | ICD-10-CM | POA: Insufficient documentation

## 2013-05-10 DIAGNOSIS — F17200 Nicotine dependence, unspecified, uncomplicated: Secondary | ICD-10-CM | POA: Insufficient documentation

## 2013-05-10 DIAGNOSIS — R51 Headache: Secondary | ICD-10-CM | POA: Insufficient documentation

## 2013-05-10 HISTORY — DX: Migraine, unspecified, not intractable, without status migrainosus: G43.909

## 2013-05-10 LAB — COMPREHENSIVE METABOLIC PANEL
ALBUMIN: 3.7 g/dL (ref 3.5–5.2)
ALT: 28 U/L (ref 0–35)
AST: 18 U/L (ref 0–37)
Alkaline Phosphatase: 70 U/L (ref 39–117)
BUN: 8 mg/dL (ref 6–23)
CALCIUM: 9.7 mg/dL (ref 8.4–10.5)
CO2: 30 mEq/L (ref 19–32)
CREATININE: 0.73 mg/dL (ref 0.50–1.10)
Chloride: 102 mEq/L (ref 96–112)
GFR calc Af Amer: >90 mL/min (ref >90)
GFR calc non Af Amer: >90 mL/min (ref >90)
Glucose, Bld: 95 mg/dL (ref 70–99)
POTASSIUM: 3.8 meq/L (ref 3.7–5.3)
Sodium: 142 mEq/L (ref 137–147)
Total Bilirubin: <0.2 mg/dL — ABNORMAL LOW (ref 0.3–1.2)
Total Protein: 7.4 g/dL (ref 6.0–8.3)

## 2013-05-10 LAB — CBC WITH DIFFERENTIAL/PLATELET
BASOS PCT: 0 % (ref 0–1)
Basophils Absolute: 0 10*3/uL (ref 0.0–0.1)
Eosinophils Absolute: 0.2 10*3/uL (ref 0.0–0.7)
Eosinophils Relative: 2 % (ref 0–5)
HCT: 41.7 % (ref 36.0–46.0)
HEMOGLOBIN: 13.8 g/dL (ref 12.0–15.0)
Lymphocytes Relative: 44 % (ref 12–46)
Lymphs Abs: 4.5 10*3/uL — ABNORMAL HIGH (ref 0.7–4.0)
MCH: 29.1 pg (ref 26.0–34.0)
MCHC: 33.1 g/dL (ref 30.0–36.0)
MCV: 87.8 fL (ref 78.0–100.0)
MONO ABS: 0.6 10*3/uL (ref 0.1–1.0)
Monocytes Relative: 6 % (ref 3–12)
NEUTROS PCT: 48 % (ref 43–77)
Neutro Abs: 5 10*3/uL (ref 1.7–7.7)
Platelets: 283 10*3/uL (ref 150–400)
RBC: 4.75 MIL/uL (ref 3.87–5.11)
RDW: 13.6 % (ref 11.5–15.5)
WBC: 10.3 10*3/uL (ref 4.0–10.5)

## 2013-05-10 NOTE — ED Notes (Signed)
Name called no answer x 2 

## 2013-05-10 NOTE — ED Notes (Signed)
Called x's 4 without response

## 2013-05-10 NOTE — ED Notes (Signed)
Name called x 4, no answer

## 2013-05-10 NOTE — ED Notes (Signed)
Pt reports sitting down with sudden onset of severe headache and abd pain. Denies any vomiting or diarrhea. Hx of migraines.

## 2013-05-10 NOTE — ED Notes (Signed)
Patient asked for urine sample. Unable to give sample at this time. 

## 2013-06-10 ENCOUNTER — Emergency Department (HOSPITAL_COMMUNITY)
Admission: EM | Admit: 2013-06-10 | Discharge: 2013-06-10 | Disposition: A | Discharge status: Home / Self Care | Payer: Medicaid Other | Attending: Emergency Medicine | Admitting: Emergency Medicine

## 2013-06-10 ENCOUNTER — Encounter (HOSPITAL_COMMUNITY): Payer: Self-pay | Admitting: Emergency Medicine

## 2013-06-10 DIAGNOSIS — R109 Unspecified abdominal pain: Secondary | ICD-10-CM

## 2013-06-10 DIAGNOSIS — R5381 Other malaise: Secondary | ICD-10-CM | POA: Insufficient documentation

## 2013-06-10 DIAGNOSIS — K219 Gastro-esophageal reflux disease without esophagitis: Secondary | ICD-10-CM | POA: Insufficient documentation

## 2013-06-10 DIAGNOSIS — Z79899 Other long term (current) drug therapy: Secondary | ICD-10-CM | POA: Insufficient documentation

## 2013-06-10 DIAGNOSIS — Z8679 Personal history of other diseases of the circulatory system: Secondary | ICD-10-CM | POA: Insufficient documentation

## 2013-06-10 DIAGNOSIS — F17200 Nicotine dependence, unspecified, uncomplicated: Secondary | ICD-10-CM | POA: Insufficient documentation

## 2013-06-10 DIAGNOSIS — R6883 Chills (without fever): Secondary | ICD-10-CM | POA: Insufficient documentation

## 2013-06-10 DIAGNOSIS — R630 Anorexia: Secondary | ICD-10-CM | POA: Insufficient documentation

## 2013-06-10 DIAGNOSIS — R5383 Other fatigue: Secondary | ICD-10-CM

## 2013-06-10 DIAGNOSIS — Z3202 Encounter for pregnancy test, result negative: Secondary | ICD-10-CM | POA: Insufficient documentation

## 2013-06-10 DIAGNOSIS — Z8739 Personal history of other diseases of the musculoskeletal system and connective tissue: Secondary | ICD-10-CM | POA: Insufficient documentation

## 2013-06-10 DIAGNOSIS — R1084 Generalized abdominal pain: Secondary | ICD-10-CM | POA: Insufficient documentation

## 2013-06-10 DIAGNOSIS — Z8659 Personal history of other mental and behavioral disorders: Secondary | ICD-10-CM | POA: Insufficient documentation

## 2013-06-10 DIAGNOSIS — R197 Diarrhea, unspecified: Secondary | ICD-10-CM | POA: Insufficient documentation

## 2013-06-10 DIAGNOSIS — R110 Nausea: Secondary | ICD-10-CM | POA: Insufficient documentation

## 2013-06-10 LAB — URINALYSIS, ROUTINE W REFLEX MICROSCOPIC
Bilirubin Urine: NEGATIVE
GLUCOSE, UA: NEGATIVE mg/dL
Hgb urine dipstick: NEGATIVE
KETONES UR: NEGATIVE mg/dL
Leukocytes, UA: NEGATIVE
Nitrite: NEGATIVE
PROTEIN: NEGATIVE mg/dL
Specific Gravity, Urine: 1.023 (ref 1.005–1.030)
UROBILINOGEN UA: 0.2 mg/dL (ref 0.0–1.0)
pH: 5.5 (ref 5.0–8.0)

## 2013-06-10 LAB — CBC WITH DIFFERENTIAL/PLATELET
BASOS ABS: 0 10*3/uL (ref 0.0–0.1)
BASOS PCT: 0 % (ref 0–1)
EOS ABS: 0.1 10*3/uL (ref 0.0–0.7)
Eosinophils Relative: 1 % (ref 0–5)
HCT: 42.1 % (ref 36.0–46.0)
Hemoglobin: 14 g/dL (ref 12.0–15.0)
Lymphocytes Relative: 14 % (ref 12–46)
Lymphs Abs: 1.9 10*3/uL (ref 0.7–4.0)
MCH: 29.3 pg (ref 26.0–34.0)
MCHC: 33.3 g/dL (ref 30.0–36.0)
MCV: 88.1 fL (ref 78.0–100.0)
MONO ABS: 0.7 10*3/uL (ref 0.1–1.0)
MONOS PCT: 5 % (ref 3–12)
NEUTROS ABS: 11 10*3/uL — AB (ref 1.7–7.7)
Neutrophils Relative %: 80 % — ABNORMAL HIGH (ref 43–77)
Platelets: 248 10*3/uL (ref 150–400)
RBC: 4.78 MIL/uL (ref 3.87–5.11)
RDW: 13.7 % (ref 11.5–15.5)
WBC: 13.8 10*3/uL — ABNORMAL HIGH (ref 4.0–10.5)

## 2013-06-10 LAB — COMPREHENSIVE METABOLIC PANEL
ALK PHOS: 73 U/L (ref 39–117)
ALT: 13 U/L (ref 0–35)
AST: 17 U/L (ref 0–37)
Albumin: 3.5 g/dL (ref 3.5–5.2)
BUN: 10 mg/dL (ref 6–23)
CHLORIDE: 101 meq/L (ref 96–112)
CO2: 24 meq/L (ref 19–32)
Calcium: 9.2 mg/dL (ref 8.4–10.5)
Creatinine, Ser: 0.78 mg/dL (ref 0.50–1.10)
GFR calc Af Amer: >90 mL/min (ref >90)
GLUCOSE: 90 mg/dL (ref 70–99)
POTASSIUM: 3.7 meq/L (ref 3.7–5.3)
SODIUM: 139 meq/L (ref 137–147)
Total Bilirubin: 0.4 mg/dL (ref 0.3–1.2)
Total Protein: 7.3 g/dL (ref 6.0–8.3)

## 2013-06-10 LAB — POCT PREGNANCY, URINE: PREG TEST UR: NEGATIVE

## 2013-06-10 LAB — LIPASE, BLOOD: Lipase: 15 U/L (ref 11–59)

## 2013-06-10 MED ORDER — HYDROCODONE-ACETAMINOPHEN 5-325 MG PO TABS: 1.0000 | ORAL_TABLET | Freq: Four times a day (QID) | ORAL | Status: DC | PRN

## 2013-06-10 MED ORDER — ONDANSETRON 4 MG PO TBDP
4.0000 mg | ORAL_TABLET | Freq: Once | ORAL | Status: AC
  Administered 2013-06-10: 4 mg via ORAL
  Filled 2013-06-10: qty 1

## 2013-06-10 MED ORDER — PROMETHAZINE HCL 25 MG PO TABS: 25.0000 mg | ORAL_TABLET | Freq: Three times a day (TID) | ORAL | Status: DC | PRN

## 2013-06-10 MED ORDER — GI COCKTAIL ~~LOC~~
30.0000 mL | Freq: Once | ORAL | Status: AC
  Administered 2013-06-10: 30 mL via ORAL
  Filled 2013-06-10: qty 30

## 2013-06-10 NOTE — ED Notes (Signed)
Pt c/o generalized abd pain, diarrhea, and chills starting this am

## 2013-06-10 NOTE — ED Provider Notes (Signed)
CSN: [id number]     Arrival date & time 06/10/13  1600 History   First MD Initiated Contact with Patient 06/10/13 1720     Chief Complaint  Patient presents with  . Abdominal Pain    HPI: Mrs. Teresa Faulkner is a 48 yo F with history of OA, GERD and anxiety who presents with abdominal pain, diarrhea and chills.  Symptoms started this morning, pain is located mostly in the upper abdomen, described as cramping, intermittent, no alleviating or exacerbating factors. Pain does not radiate, associated with multiple episodes of diarrhea. No blood or mucus in her stool. Pain level is 7/10. She took Imodium with no improvement of diarrhea. She also endorses nausea without vomiting. She has been drinking fluids but not eating today due to decreased appetite. Her son and husband had similar symptoms within the last few days. She is not immunocompromised, no recent travel or antibiotic use. Given her worsening symptoms, she presents for evaluation.    Past Medical History  Diagnosis Date  . Arthritis   . GERD (gastroesophageal reflux disease)   . Bursitis   . Anxiety   . Migraine    Past Surgical History  Procedure Laterality Date  . Back surgery    . Tubal ligation     Family History  Problem Relation Age of Onset  . Diabetes Mother   . Diabetes Maternal Aunt   . Hypertension Maternal Aunt    History  Substance Use Topics  . Smoking status: Current Every Day Smoker -- 0.25 packs/day for 20 years    Types: Cigarettes  . Smokeless tobacco: Never Used  . Alcohol Use: No   OB History   Grav Para Term Preterm Abortions TAB SAB Ect Mult Living   2 2 2  0 0 0 0 0 0 2     Review of Systems  Constitutional: Positive for chills, appetite change (decreased) and fatigue. Negative for fever.  Eyes: Negative for photophobia and visual disturbance.  Respiratory: Negative for cough and shortness of breath.   Cardiovascular: Negative for chest pain and leg swelling.  Gastrointestinal: Positive for nausea,  abdominal pain and diarrhea. Negative for vomiting and constipation.  Genitourinary: Negative for dysuria, frequency and decreased urine volume.  Musculoskeletal: Negative for arthralgias, back pain, gait problem and myalgias.  Skin: Negative for color change and wound.  Neurological: Negative for dizziness, syncope, light-headedness and headaches.  Psychiatric/Behavioral: Negative for confusion and agitation.  All other systems reviewed and are negative.    Allergies  Review of patient's allergies indicates no known allergies.  Home Medications   Current Outpatient Rx  Name  Route  Sig  Dispense  Refill  . esomeprazole (NEXIUM) 40 MG capsule   Oral   Take 40 mg by mouth daily at 12 noon.          BP 138/73  Pulse 102  Temp(Src) 99.3 F (37.4 C) (Oral)  Resp 16  Ht 5\' 7"  (1.702 m)  Wt 201 lb 9.6 oz (91.445 kg)  BMI 31.57 kg/m2  SpO2 96% Physical Exam  Nursing note and vitals reviewed. Constitutional: She is oriented to person, place, and time. She appears well-developed and well-nourished. No distress.  HENT:  Head: Normocephalic and atraumatic.  Mouth/Throat: Oropharynx is clear and moist.  Eyes: Conjunctivae and EOM are normal. Pupils are equal, round, and reactive to light.  Neck: Normal range of motion. Neck supple.  Cardiovascular: Normal rate, regular rhythm, normal heart sounds and intact distal pulses.   Pulmonary/Chest: Effort normal  and breath sounds normal. No respiratory distress.  Abdominal: Soft. Bowel sounds are normal. There is tenderness (diffusely in the upper abdomen, worse on left. Negative Murphy's sign. ). There is no rebound and no guarding.  Musculoskeletal: Normal range of motion. She exhibits no edema and no tenderness.  Neurological: She is alert and oriented to person, place, and time. No cranial nerve deficit. Coordination normal.  Skin: Skin is warm and dry. No rash noted.  Psychiatric: She has a normal mood and affect. Her behavior is  normal.    ED Course  Procedures (including critical care time) Labs Review Labs Reviewed  CBC WITH DIFFERENTIAL - Abnormal; Notable for the following:    WBC 13.8 (*)    Neutrophils Relative % 80 (*)    Neutro Abs 11.0 (*)    All other components within normal limits  URINALYSIS, ROUTINE W REFLEX MICROSCOPIC - Abnormal; Notable for the following:    APPearance HAZY (*)    All other components within normal limits  COMPREHENSIVE METABOLIC PANEL  LIPASE, BLOOD  POCT PREGNANCY, URINE   Imaging Review No results found.  EKG Interpretation   None       MDM   48 yo F presents with symptoms consistent with gastroenteritis. Sick contacts at home. Doubt cholecystitis as she has a negative Murphy's sign, afebrile, normal WBC, normal LFT's. Doubt pancreatitis as lipase is normal. Doubt appendicitis or diverticulitis given location of pain and abdominal exam. Doubt obstruction as she continues to have BM's, pass gas and tolerating liquids. Symptoms likely gastroenteritis. Will treat symptomatically, DC home when better.   Repeat evaluation after GI cocktail and Zofran reveals improved symptoms. Abdominal exam with minimal upper abdominal tenderness, not peritonitic. Will PO challenge then DC home with pain and nausea meds.   Successful PO challenge. Pain and nausea controlled, HD stable, abdominal exam benign, felt she was stable for outpatient management.  Rx for antiemetics and small amount of norco. Advised PCP f/u in two days if not better. Strict return precautions given. She was in agreement with plan.   Reviewed imaging, labs and previous medical records, utilized in MDM  Discussed case with Dr. Patria Mane  Clinical Impression 1. Abdominal pain 2. Diarrhea.   Margie Billet, MD 06/11/13 [phone]

## 2013-06-10 NOTE — Discharge Instructions (Signed)
Your symptoms are likely due to a viral infection. If you continue to have pain please see your doctor in two days. If symptoms worsen return to the ED. Do not drive or work while taking pain medications.   Abdominal Pain, Adult Many things can cause abdominal pain. Usually, abdominal pain is not caused by a disease and will improve without treatment. It can often be observed and treated at home. Your health care provider will do a physical exam and possibly order blood tests and X-rays to help determine the seriousness of your pain. However, in many cases, more time must pass before a clear cause of the pain can be found. Before that point, your health care provider may not know if you need more testing or further treatment. HOME CARE INSTRUCTIONS  Monitor your abdominal pain for any changes. The following actions may help to alleviate any discomfort you are experiencing:  Only take over-the-counter or prescription medicines as directed by your health care provider.  Do not take laxatives unless directed to do so by your health care provider.  Try a clear liquid diet (broth, tea, or water) as directed by your health care provider. Slowly move to a bland diet as tolerated. SEEK MEDICAL CARE IF:  You have unexplained abdominal pain.  You have abdominal pain associated with nausea or diarrhea.  You have pain when you urinate or have a bowel movement.  You experience abdominal pain that wakes you in the night.  You have abdominal pain that is worsened or improved by eating food.  You have abdominal pain that is worsened with eating fatty foods. SEEK IMMEDIATE MEDICAL CARE IF:   Your pain does not go away within 2 hours.  You have a fever.  You keep throwing up (vomiting).  Your pain is felt only in portions of the abdomen, such as the right side or the left lower portion of the abdomen.  You pass bloody or black tarry stools. MAKE SURE YOU:  Understand these instructions.   Will  watch your condition.   Will get help right away if you are not doing well or get worse.  Document Released: 02/01/2005 Document Revised: 02/12/2013 Document Reviewed: 01/01/2013 [facility] Patient Information 2014 [employer], [place].

## 2013-06-12 NOTE — ED Provider Notes (Signed)
I saw and evaluated the patient, reviewed the resident's note and I agree with the findings and plan.  EKG Interpretation   None       No significant tenderness on exam. Dc home in good condition  Lyanne CoKevin M Coreena Rubalcava, MD 06/12/13 [phone]

## 2013-09-18 ENCOUNTER — Emergency Department (HOSPITAL_COMMUNITY)
Admission: EM | Admit: 2013-09-18 | Discharge: 2013-09-18 | Disposition: A | Discharge status: Home / Self Care | Payer: Medicaid Other | Attending: Emergency Medicine | Admitting: Emergency Medicine

## 2013-09-18 ENCOUNTER — Encounter (HOSPITAL_COMMUNITY): Payer: Self-pay | Admitting: Emergency Medicine

## 2013-09-18 DIAGNOSIS — M461 Sacroiliitis, not elsewhere classified: Secondary | ICD-10-CM

## 2013-09-18 DIAGNOSIS — F17200 Nicotine dependence, unspecified, uncomplicated: Secondary | ICD-10-CM | POA: Insufficient documentation

## 2013-09-18 DIAGNOSIS — M129 Arthropathy, unspecified: Secondary | ICD-10-CM | POA: Insufficient documentation

## 2013-09-18 DIAGNOSIS — Z8659 Personal history of other mental and behavioral disorders: Secondary | ICD-10-CM | POA: Insufficient documentation

## 2013-09-18 DIAGNOSIS — Z79899 Other long term (current) drug therapy: Secondary | ICD-10-CM | POA: Insufficient documentation

## 2013-09-18 DIAGNOSIS — K219 Gastro-esophageal reflux disease without esophagitis: Secondary | ICD-10-CM | POA: Insufficient documentation

## 2013-09-18 DIAGNOSIS — Z9889 Other specified postprocedural states: Secondary | ICD-10-CM | POA: Insufficient documentation

## 2013-09-18 DIAGNOSIS — Z8679 Personal history of other diseases of the circulatory system: Secondary | ICD-10-CM | POA: Insufficient documentation

## 2013-09-18 MED ORDER — HYDROCODONE-ACETAMINOPHEN 5-325 MG PO TABS: 1.0000 | ORAL_TABLET | ORAL | Status: DC | PRN

## 2013-09-18 MED ORDER — IBUPROFEN 800 MG PO TABS: 800.0000 mg | ORAL_TABLET | Freq: Three times a day (TID) | ORAL | Status: DC

## 2013-09-18 MED ORDER — KETOROLAC TROMETHAMINE 60 MG/2ML IM SOLN
60.0000 mg | Freq: Once | INTRAMUSCULAR | Status: AC
  Administered 2013-09-18: 60 mg via INTRAMUSCULAR
  Filled 2013-09-18: qty 2

## 2013-09-18 MED ORDER — METHOCARBAMOL 500 MG PO TABS: 500.0000 mg | ORAL_TABLET | Freq: Two times a day (BID) | ORAL | Status: DC

## 2013-09-18 NOTE — ED Notes (Signed)
Pt c/o left hip pain that started 1 week ago, sts it is very tender to touch. Denies injury/trauma to this location. Denies redness/swelling/warmth to site. Pt ambulatory into room. sts pain increases when she sits down and tries to get up. Nad, skin warm and dry, resp e/u.

## 2013-09-18 NOTE — ED Provider Notes (Signed)
CSN: [id number]     Arrival date & time 09/18/13  1616 History   This chart was scribed for non-physician practitioner Dierdre Forth, PA-C, working with Hurman Horn, MD, by Yevette Edwards, ED Scribe. This patient was seen in room TR11C/TR11C and the patient's care was started at 6:02 PM.  First MD Initiated Contact with Patient 09/18/13 1735     Chief Complaint  Patient presents with  . Hip Pain    The history is provided by the patient. No language interpreter was used.   HPI Comments: DEMIAH Faulkner is a 48 y.o. Female, with a h/o arthritis, who presents to the Emergency Department complaining of persistent pain to her left hip has persisted for a week. [name] reports she was sitting at church last Sunday when she first noticed the pain. The pt denies recent falls or trauma to the hip. She characterizes the pain as "sharp and achy." She reports the pain increases at night when she lies upon her left side or back. She also states the pain is increased with pressure, lifting her leg to step up, and when she changes positions from sitting to standing.  The pt states she has used IBU without relief. She denies pain to her groin, and she denies "grinding" of the hip. She also denies numbness, weakness, or paresthesia to her left leg. She denies a h/o similar symptoms. The pt had surgery to the L4 and L5 six years ago; she has experienced back pain since the surgery. The pt denies DM. She was being treated by Dr. Magnus Ivan, an orthopedic. She states she walks her pit-bull four times a day.   Past Medical History  Diagnosis Date  . Arthritis   . GERD (gastroesophageal reflux disease)   . Bursitis   . Anxiety   . Migraine    Past Surgical History  Procedure Laterality Date  . Back surgery    . Tubal ligation     Family History  Problem Relation Age of Onset  . Diabetes Mother   . Diabetes Maternal Aunt   . Hypertension Maternal Aunt    History  Substance Use Topics  . Smoking  status: Current Every Day Smoker -- 0.25 packs/day for 20 years    Types: Cigarettes  . Smokeless tobacco: Never Used  . Alcohol Use: No   OB History   Grav Para Term Preterm Abortions TAB SAB Ect Mult Living   2 2 2  0 0 0 0 0 0 2     Review of Systems  Constitutional: Negative for fever.  Genitourinary: Negative for urgency.  Musculoskeletal: Positive for arthralgias. Negative for gait problem.  Neurological: Negative for weakness and numbness.  All other systems reviewed and are negative.   Allergies  Review of patient's allergies indicates no known allergies.  Home Medications   Prior to Admission medications   Medication Sig Start Date End Date Taking? Authorizing Provider  ibuprofen (ADVIL,MOTRIN) 200 MG tablet Take 400 mg by mouth every 6 (six) hours as needed for moderate pain.   Yes Historical Provider, MD  omeprazole (PRILOSEC) 20 MG capsule Take 20 mg by mouth daily.   Yes Historical Provider, MD   Triage Vitals: BP 129/64  Pulse 77  Temp(Src) 98.2 F (36.8 C) (Oral)  Resp 18  Ht 5\' 7"  (1.702 m)  Wt 202 lb 9.6 oz (91.899 kg)  BMI 31.72 kg/m2  SpO2 99%  Physical Exam  Nursing note and vitals reviewed. Constitutional: She appears well-developed and well-nourished.  No distress.  Awake, alert, nontoxic appearance  HENT:  Head: Normocephalic and atraumatic.  Mouth/Throat: Oropharynx is clear and moist. No oropharyngeal exudate.  Eyes: Conjunctivae are normal. No scleral icterus.  Neck: Normal range of motion. Neck supple.  Full ROM without pain  Cardiovascular: Normal rate, regular rhythm, normal heart sounds and intact distal pulses.   No murmur heard. Pulmonary/Chest: Effort normal and breath sounds normal. No respiratory distress. She has no wheezes.  Abdominal: Soft. Bowel sounds are normal. She exhibits no distension and no mass. There is no tenderness. There is no rebound and no guarding.  Musculoskeletal: She exhibits no edema.       Left hip: She  exhibits normal range of motion, normal strength, no tenderness, no bony tenderness, no swelling, no crepitus, no deformity and no laceration.       Lumbar back: She exhibits decreased range of motion and tenderness ( Left SI joint). She exhibits no bony tenderness, no swelling, no edema, no deformity, no laceration, no pain, no spasm and normal pulse.  Left-sided paraspinal tenderness. Palpation of the left SI joint No pain to palpation over the greater trochanter.  No pain to palpation over buttocks.  No midline tenderness. Mildly decreased ROM of L spine secondary to pain. Reflexes intact.  Full range of motion of the bilateral hips  Lymphadenopathy:    She has no cervical adenopathy.  Neurological: She is alert. She has normal reflexes.  Speech is clear and goal oriented, follows commands Normal 5/5 strength in upper and lower extremities bilaterally including dorsiflexion and plantar flexion, strong and equal grip strength Sensation normal to light and sharp touch Moves extremities without ataxia, coordination intact Normal gait Normal balance   Skin: Skin is warm and dry. No rash noted. She is not diaphoretic. No erythema.  Psychiatric: She has a normal mood and affect. Her behavior is normal.    ED Course  Procedures (including critical care time)  DIAGNOSTIC STUDIES: Oxygen Saturation is 99% on room air, normal by my interpretation.    COORDINATION OF CARE:  - Discussed treatment plan with patient, and the patient agreed to the plan. The plan includes usage of NSAIDs, a muscle relaxant, and pain medication. Also encouraged the pt to follow-up with Dr. Magnus IvanBlackman. Advised pt to return to the ED if symptoms worsen.   Labs Review Labs Reviewed - No data to display  Imaging Review No results found.   EKG Interpretation None      MDM   Final diagnoses:  Sacroiliitis    Teresa Faulkner presents with left-sided low back pain, nontraumatic. On exam patient with pain  specifically over the left SI joint with some mild accompanying pain to the left paraspinal muscles. No midline tenderness. Exam not consistent with left hip joint pain, trochanteric bursitis or sciatica. Discussed with patient the role of anti-inflammatories and pain control and treatment. Discussed the pros and cons of steroids and patient declines.  Patient with back pain.  No neurological deficits and normal neuro exam.  Patient can walk but states is painful.  No loss of bowel or bladder control.  No concern for cauda equina.  No fever, night sweats, weight loss, h/o cancer, IVDU.  RICE protocol and pain medicine indicated and discussed with patient.  It has been determined that no acute conditions requiring further emergency intervention are present at this time. The patient/guardian have been advised of the diagnosis and plan. We have discussed signs and symptoms that warrant return to the ED,  such as changes or worsening in symptoms.   Vital signs are stable at discharge.   BP 129/64  Pulse 77  Temp(Src) 98.2 F (36.8 C) (Oral)  Resp 18  Ht 5\' 7"  (1.702 m)  Wt 202 lb 9.6 oz (91.899 kg)  BMI 31.72 kg/m2  SpO2 99%  Patient/guardian has voiced understanding and agreed to follow-up with the PCP or specialist.  I personally performed the services described in this documentation, which was scribed in my presence. The recorded information has been reviewed and is accurate.     Dahlia ClientHannah Roschelle Calandra, PA-C 09/18/13 1825

## 2013-09-18 NOTE — ED Notes (Signed)
Left hip pain for one week, no relief with lying/sitting/standing. Denies injury. New onset.

## 2013-09-18 NOTE — Discharge Instructions (Signed)
1. Medications: robaxin, naproxyn, vicodin, usual home medications 2. Treatment: rest, drink plenty of fluids, gentle stretching as discussed, alternate ice and heat 3. Follow Up: Please followup with Dr. Magnus Ivanblackman of orthopedics for further evaluation and treatment  Back Exercises Back exercises help treat and prevent back injuries. The goal of back exercises is to increase the strength of your abdominal and back muscles and the flexibility of your back. These exercises should be started when you no longer have back pain. Back exercises include:  Pelvic Tilt. Lie on your back with your knees bent. Tilt your pelvis until the lower part of your back is against the floor. Hold this position 5 to 10 sec and repeat 5 to 10 times.  Knee to Chest. Pull first 1 knee up against your chest and hold for 20 to 30 seconds, repeat this with the other knee, and then both knees. This may be done with the other leg straight or bent, whichever feels better.  Sit-Ups or Curl-Ups. Bend your knees 90 degrees. Start with tilting your pelvis, and do a partial, slow sit-up, lifting your trunk only 30 to 45 degrees off the floor. Take at least 2 to 3 seconds for each sit-up. Do not do sit-ups with your knees out straight. If partial sit-ups are difficult, simply do the above but with only tightening your abdominal muscles and holding it as directed.  Hip-Lift. Lie on your back with your knees flexed 90 degrees. Push down with your feet and shoulders as you raise your hips a couple inches off the floor; hold for 10 seconds, repeat 5 to 10 times.  Back arches. Lie on your stomach, propping yourself up on bent elbows. Slowly press on your hands, causing an arch in your low back. Repeat 3 to 5 times. Any initial stiffness and discomfort should lessen with repetition over time.  Shoulder-Lifts. Lie face down with arms beside your body. Keep hips and torso pressed to floor as you slowly lift your head and shoulders off the  floor. Do not overdo your exercises, especially in the beginning. Exercises may cause you some mild back discomfort which lasts for a few minutes; however, if the pain is more severe, or lasts for more than 15 minutes, do not continue exercises until you see your caregiver. Improvement with exercise therapy for back problems is slow.  See your caregivers for assistance with developing a proper back exercise program. Document Released: 06/01/2004 Document Revised: 07/17/2011 Document Reviewed: 02/23/2011 [facility] Patient Information 2014 [place], [place].

## 2013-09-19 NOTE — ED Provider Notes (Signed)
Medical screening examination/treatment/procedure(s) were performed by non-physician practitioner and as supervising physician I was immediately available for consultation/collaboration.   EKG Interpretation None       Teresa HornJohn M Dathan Attia, MD 09/19/13 2148

## 2013-12-17 ENCOUNTER — Other Ambulatory Visit: Payer: Self-pay | Admitting: Gastroenterology

## 2013-12-17 DIAGNOSIS — R1013 Epigastric pain: Secondary | ICD-10-CM

## 2013-12-24 ENCOUNTER — Ambulatory Visit
Admission: RE | Admit: 2013-12-24 | Discharge: 2013-12-24 | Disposition: A | Discharge status: Home / Self Care | Payer: Medicaid Other | Source: Ambulatory Visit | Attending: Gastroenterology | Admitting: Gastroenterology

## 2013-12-24 DIAGNOSIS — R1013 Epigastric pain: Secondary | ICD-10-CM

## 2014-03-09 ENCOUNTER — Encounter (HOSPITAL_COMMUNITY): Payer: Self-pay | Admitting: Emergency Medicine

## 2014-08-27 ENCOUNTER — Emergency Department (HOSPITAL_COMMUNITY)
Admission: EM | Admit: 2014-08-27 | Discharge: 2014-08-27 | Disposition: A | Discharge status: Home / Self Care | Payer: Medicaid Other | Attending: Emergency Medicine | Admitting: Emergency Medicine

## 2014-08-27 ENCOUNTER — Encounter (HOSPITAL_COMMUNITY): Payer: Self-pay

## 2014-08-27 DIAGNOSIS — Z3202 Encounter for pregnancy test, result negative: Secondary | ICD-10-CM | POA: Diagnosis not present

## 2014-08-27 DIAGNOSIS — A5901 Trichomonal vulvovaginitis: Secondary | ICD-10-CM

## 2014-08-27 DIAGNOSIS — Z79899 Other long term (current) drug therapy: Secondary | ICD-10-CM | POA: Diagnosis not present

## 2014-08-27 DIAGNOSIS — K219 Gastro-esophageal reflux disease without esophagitis: Secondary | ICD-10-CM | POA: Insufficient documentation

## 2014-08-27 DIAGNOSIS — Z8659 Personal history of other mental and behavioral disorders: Secondary | ICD-10-CM | POA: Diagnosis not present

## 2014-08-27 DIAGNOSIS — Z202 Contact with and (suspected) exposure to infections with a predominantly sexual mode of transmission: Secondary | ICD-10-CM

## 2014-08-27 DIAGNOSIS — N760 Acute vaginitis: Secondary | ICD-10-CM | POA: Diagnosis not present

## 2014-08-27 DIAGNOSIS — Z720 Tobacco use: Secondary | ICD-10-CM | POA: Diagnosis not present

## 2014-08-27 DIAGNOSIS — M1990 Unspecified osteoarthritis, unspecified site: Secondary | ICD-10-CM | POA: Insufficient documentation

## 2014-08-27 DIAGNOSIS — Z792 Long term (current) use of antibiotics: Secondary | ICD-10-CM | POA: Insufficient documentation

## 2014-08-27 DIAGNOSIS — Z8679 Personal history of other diseases of the circulatory system: Secondary | ICD-10-CM | POA: Diagnosis not present

## 2014-08-27 DIAGNOSIS — B9689 Other specified bacterial agents as the cause of diseases classified elsewhere: Secondary | ICD-10-CM

## 2014-08-27 LAB — I-STAT BETA HCG BLOOD, ED (MC, WL, AP ONLY): I-stat hCG, quantitative: 5.2 m[IU]/mL — ABNORMAL HIGH (ref <5)

## 2014-08-27 LAB — POC URINE PREG, ED
Preg Test, Ur: NEGATIVE
Preg Test, Ur: NEGATIVE

## 2014-08-27 LAB — WET PREP, GENITAL: Yeast Wet Prep HPF POC: NONE SEEN

## 2014-08-27 LAB — GC/CHLAMYDIA PROBE AMP (~~LOC~~) NOT AT ARMC
Chlamydia: NEGATIVE
Neisseria Gonorrhea: NEGATIVE

## 2014-08-27 MED ORDER — AZITHROMYCIN 250 MG PO TABS
1000.0000 mg | ORAL_TABLET | Freq: Once | ORAL | Status: AC
  Administered 2014-08-27: 1000 mg via ORAL
  Filled 2014-08-27: qty 4

## 2014-08-27 MED ORDER — CEFTRIAXONE SODIUM 250 MG IJ SOLR
250.0000 mg | Freq: Once | INTRAMUSCULAR | Status: AC
  Administered 2014-08-27: 250 mg via INTRAMUSCULAR
  Filled 2014-08-27: qty 250

## 2014-08-27 MED ORDER — LIDOCAINE HCL (PF) 1 % IJ SOLN
5.0000 mL | Freq: Once | INTRAMUSCULAR | Status: AC
  Administered 2014-08-27: 5 mL
  Filled 2014-08-27: qty 5

## 2014-08-27 MED ORDER — METRONIDAZOLE 500 MG PO TABS: 500.0000 mg | ORAL_TABLET | Freq: Two times a day (BID) | ORAL | Status: DC

## 2014-08-27 NOTE — Discharge Instructions (Signed)
Take flagyl twice daily for 1 week. It is important to take the entire course of the antibiotic. You are obligated to inform your partner of this diagnosis so that he may be treated. You were treated today for both gonorrhea and chlamydia. If these tests result positive, you will be contacted and are then obligated to inform your partner for treatment.  Bacterial Vaginosis Bacterial vaginosis is a vaginal infection that occurs when the normal balance of bacteria in the vagina is disrupted. It results from an overgrowth of certain bacteria. This is the most common vaginal infection in women of childbearing age. Treatment is important to prevent complications, especially in pregnant women, as it can cause a premature delivery. CAUSES  Bacterial vaginosis is caused by an increase in harmful bacteria that are normally present in smaller amounts in the vagina. Several different kinds of bacteria can cause bacterial vaginosis. However, the reason that the condition develops is not fully understood. RISK FACTORS Certain activities or behaviors can put you at an increased risk of developing bacterial vaginosis, including:  Having a new sex partner or multiple sex partners.  Douching.  Using an intrauterine device (IUD) for contraception. Women do not get bacterial vaginosis from toilet seats, bedding, swimming pools, or contact with objects around them. SIGNS AND SYMPTOMS  Some women with bacterial vaginosis have no signs or symptoms. Common symptoms include:  Grey vaginal discharge.  A fishlike odor with discharge, especially after sexual intercourse.  Itching or burning of the vagina and vulva.  Burning or pain with urination. DIAGNOSIS  Your health care provider will take a medical history and examine the vagina for signs of bacterial vaginosis. A sample of vaginal fluid may be taken. Your health care provider will look at this sample under a microscope to check for bacteria and abnormal cells.  A vaginal pH test may also be done.  TREATMENT  Bacterial vaginosis may be treated with antibiotic medicines. These may be given in the form of a pill or a vaginal cream. A second round of antibiotics may be prescribed if the condition comes back after treatment.  HOME CARE INSTRUCTIONS   Only take over-the-counter or prescription medicines as directed by your health care provider.  If antibiotic medicine was prescribed, take it as directed. Make sure you finish it even if you start to feel better.  Do not have sex until treatment is completed.  Tell all sexual partners that you have a vaginal infection. They should see their health care provider and be treated if they have problems, such as a mild rash or itching.  Practice safe sex by using condoms and only having one sex partner. SEEK MEDICAL CARE IF:   Your symptoms are not improving after 3 days of treatment.  You have increased discharge or pain.  You have a fever. MAKE SURE YOU:   Understand these instructions.  Will watch your condition.  Will get help right away if you are not doing well or get worse. FOR MORE INFORMATION  Centers for Disease Control and Prevention, Division of STD Prevention: SolutionApps.co.za American Sexual Health Association (ASHA): www.ashastd.org  Document Released: 04/24/2005 Document Revised: 02/12/2013 Document Reviewed: 12/04/2012 [facility] Patient Information 2015 [place], [place]. This information is not intended to replace advice given to you by your health care provider. Make sure you discuss any questions you have with your health care provider.  Sexually Transmitted Disease A sexually transmitted disease (STD) is a disease or infection that may be passed (transmitted) from person to  person, usually during sexual activity. This may happen by way of saliva, semen, blood, vaginal mucus, or urine. Common STDs include:   Gonorrhea.   Chlamydia.   Syphilis.   HIV and AIDS.   Genital  herpes.   Hepatitis B and C.   Trichomonas.   Human papillomavirus (HPV).   Pubic lice.   Scabies.  Mites.  Bacterial vaginosis. WHAT ARE CAUSES OF STDs? An STD may be caused by bacteria, a virus, or parasites. STDs are often transmitted during sexual activity if one person is infected. However, they may also be transmitted through nonsexual means. STDs may be transmitted after:   Sexual intercourse with an infected person.   Sharing sex toys with an infected person.   Sharing needles with an infected person or using unclean piercing or tattoo needles.  Having intimate contact with the genitals, mouth, or rectal areas of an infected person.   Exposure to infected fluids during birth. WHAT ARE THE SIGNS AND SYMPTOMS OF STDs? Different STDs have different symptoms. Some people may not have any symptoms. If symptoms are present, they may include:   Painful or bloody urination.   Pain in the pelvis, abdomen, vagina, anus, throat, or eyes.   A skin rash, itching, or irritation.  Growths, ulcerations, blisters, or sores in the genital and anal areas.  Abnormal vaginal discharge with or without bad odor.   Penile discharge in men.   Fever.   Pain or bleeding during sexual intercourse.   Swollen glands in the groin area.   Yellow skin and eyes (jaundice). This is seen with hepatitis.   Swollen testicles.  Infertility.  Sores and blisters in the mouth. HOW ARE STDs DIAGNOSED? To make a diagnosis, your health care provider may:   Take a medical history.   Perform a physical exam.   Take a sample of any discharge to examine.  Swab the throat, cervix, opening to the penis, rectum, or vagina for testing.  Test a sample of your first morning urine.   Perform blood tests.   Perform a Pap test, if this applies.   Perform a colposcopy.   Perform a laparoscopy.  HOW ARE STDs TREATED? Treatment depends on the STD. Some STDs may be  treated but not cured.   Chlamydia, gonorrhea, trichomonas, and syphilis can be cured with antibiotic medicine.   Genital herpes, hepatitis, and HIV can be treated, but not cured, with prescribed medicines. The medicines lessen symptoms.   Genital warts from HPV can be treated with medicine or by freezing, burning (electrocautery), or surgery. Warts may come back.   HPV cannot be cured with medicine or surgery. However, abnormal areas may be removed from the cervix, vagina, or vulva.   If your diagnosis is confirmed, your recent sexual partners need treatment. This is true even if they are symptom-free or have a negative culture or evaluation. They should not have sex until their health care providers say it is okay. HOW CAN I REDUCE MY RISK OF GETTING AN STD? Take these steps to reduce your risk of getting an STD:  Use latex condoms, dental dams, and water-soluble lubricants during sexual activity. Do not use petroleum jelly or oils.  Avoid having multiple sex partners.  Do not have sex with someone who has other sex partners.  Do not have sex with anyone you do not know or who is at high risk for an STD.  Avoid risky sex practices that can break your skin.  Do not have sex  if you have open sores on your mouth or skin.  Avoid drinking too much alcohol or taking illegal drugs. Alcohol and drugs can affect your judgment and put you in a vulnerable position.  Avoid engaging in oral and anal sex acts.  Get vaccinated for HPV and hepatitis. If you have not received these vaccines in the past, talk to your health care provider about whether one or both might be right for you.   If you are at risk of being infected with HIV, it is recommended that you take a prescription medicine daily to prevent HIV infection. This is called pre-exposure prophylaxis (PrEP). You are considered at risk if:  You are a man who has sex with other men (MSM).  You are a heterosexual man or woman and are  sexually active with more than one partner.  You take drugs by injection.  You are sexually active with a partner who has HIV.  Talk with your health care provider about whether you are at high risk of being infected with HIV. If you choose to begin PrEP, you should first be tested for HIV. You should then be tested every 3 months for as long as you are taking PrEP.  WHAT SHOULD I DO IF I THINK I HAVE AN STD?  See your health care provider.   Tell your sexual partner(s). They should be tested and treated for any STDs.  Do not have sex until your health care provider says it is okay. WHEN SHOULD I GET IMMEDIATE MEDICAL CARE? Contact your health care provider right away if:   You have severe abdominal pain.  You are a man and notice swelling or pain in your testicles.  You are a woman and notice swelling or pain in your vagina. Document Released: 07/15/2002 Document Revised: 04/29/2013 Document Reviewed: 11/12/2012 [facility] Patient Information 2015 [place], [place]. This information is not intended to replace advice given to you by your health care provider. Make sure you discuss any questions you have with your health care provider.  Trichomoniasis Trichomoniasis is an infection caused by an organism called Trichomonas. The infection can affect both women and men. In women, the outer female genitalia and the vagina are affected. In men, the penis is mainly affected, but the prostate and other reproductive organs can also be involved. Trichomoniasis is a sexually transmitted infection (STI) and is most often passed to another person through sexual contact.  RISK FACTORS  Having unprotected sexual intercourse.  Having sexual intercourse with an infected partner. SIGNS AND SYMPTOMS  Symptoms of trichomoniasis in women include:  Abnormal gray-green frothy vaginal discharge.  Itching and irritation of the vagina.  Itching and irritation of the area outside the vagina. Symptoms of  trichomoniasis in men include:   Penile discharge with or without pain.  Pain during urination. This results from inflammation of the urethra. DIAGNOSIS  Trichomoniasis may be found during a Pap test or physical exam. Your health care provider may use one of the following methods to help diagnose this infection:  Examining vaginal discharge under a microscope. For men, urethral discharge would be examined.  Testing the pH of the vagina with a test tape.  Using a vaginal swab test that checks for the Trichomonas organism. A test is available that provides results within a few minutes.  Doing a culture test for the organism. This is not usually needed. TREATMENT   You may be given medicine to fight the infection. Women should inform their health care provider if  they could be or are pregnant. Some medicines used to treat the infection should not be taken during pregnancy.  Your health care provider may recommend over-the-counter medicines or creams to decrease itching or irritation.  Your sexual partner will need to be treated if infected. HOME CARE INSTRUCTIONS   Take medicines only as directed by your health care provider.  Take over-the-counter medicine for itching or irritation as directed by your health care provider.  Do not have sexual intercourse while you have the infection.  Women should not douche or wear tampons while they have the infection.  Discuss your infection with your partner. Your partner may have gotten the infection from you, or you may have gotten it from your partner.  Have your sex partner get examined and treated if necessary.  Practice safe, informed, and protected sex.  See your health care provider for other STI testing. SEEK MEDICAL CARE IF:   You still have symptoms after you finish your medicine.  You develop abdominal pain.  You have pain when you urinate.  You have bleeding after sexual intercourse.  You develop a rash.  Your  medicine makes you sick or makes you throw up (vomit). MAKE SURE YOU:  Understand these instructions.  Will watch your condition.  Will get help right away if you are not doing well or get worse. Document Released: 10/18/2000 Document Revised: 09/08/2013 Document Reviewed: 02/03/2013 [facility] Patient Information 2015 [place], [place]. This information is not intended to replace advice given to you by your health care provider. Make sure you discuss any questions you have with your health care provider.

## 2014-08-27 NOTE — ED Notes (Signed)
Was called by her BF that he had been here and treated for chlamydia and now she wants to be checked. Pt and partner last had intercourse one day last week. Pt denies any discharge or odd bleeding.

## 2014-08-27 NOTE — ED Notes (Signed)
Pt aware of need for urine sample; given specimen cup to attempt sample

## 2014-08-27 NOTE — ED Notes (Signed)
Pt requesting STD check after her partner reported testing positive for chlamydia. Pt denies complaints at this time

## 2014-08-27 NOTE — ED Provider Notes (Signed)
CSN: [id number]     Arrival date & time 08/27/14  0759 History   First MD Initiated Contact with Patient 08/27/14 0809     Chief Complaint  Patient presents with  . Exposure to STD     (Consider location/radiation/quality/duration/timing/severity/associated sxs/prior Treatment) HPI Comments: 49 y/o F presents requesting STD check. States a new partner called her two days ago and told her he had chlamydia. Had unprotected intercourse one day last week. Denies vaginal discharge aside from her "normal vaginal discharge". Has not had a menstrual period in 1 year, however does not know whether or not she is postmenopausal. Denies abdominal pain, fever, chills, nausea or vomiting. No urinary symptoms.  Patient is a 49 y.o. female presenting with STD exposure. The history is provided by the patient.  Exposure to STD    Past Medical History  Diagnosis Date  . Arthritis   . GERD (gastroesophageal reflux disease)   . Bursitis   . Anxiety   . Migraine    Past Surgical History  Procedure Laterality Date  . Back surgery    . Tubal ligation     Family History  Problem Relation Age of Onset  . Diabetes Mother   . Diabetes Maternal Aunt   . Hypertension Maternal Aunt    History  Substance Use Topics  . Smoking status: Current Every Day Smoker -- 0.25 packs/day for 20 years    Types: Cigarettes  . Smokeless tobacco: Never Used  . Alcohol Use: No   OB History    Gravida Para Term Preterm AB TAB SAB Ectopic Multiple Living   2 2 2  0 0 0 0 0 0 2     Review of Systems  10 Systems reviewed and are negative for acute change except as noted in the HPI.  Allergies  Review of patient's allergies indicates no known allergies.  Home Medications   Prior to Admission medications   Medication Sig Start Date End Date Taking? Authorizing Provider  HYDROcodone-acetaminophen (NORCO/VICODIN) 5-325 MG per tablet Take 1-2 tablets by mouth every 4 (four) hours as needed. 09/18/13   Hannah  Muthersbaugh, PA-C  ibuprofen (ADVIL,MOTRIN) 200 MG tablet Take 400 mg by mouth every 6 (six) hours as needed for moderate pain.    Historical Provider, MD  ibuprofen (ADVIL,MOTRIN) 800 MG tablet Take 1 tablet (800 mg total) by mouth 3 (three) times daily. 09/18/13   Hannah Muthersbaugh, PA-C  methocarbamol (ROBAXIN) 500 MG tablet Take 1 tablet (500 mg total) by mouth 2 (two) times daily. 09/18/13   Hannah Muthersbaugh, PA-C  metroNIDAZOLE (FLAGYL) 500 MG tablet Take 1 tablet (500 mg total) by mouth 2 (two) times daily. One po bid x 7 days 08/27/14   Kathrynn Speedobyn M Demarus Latterell, PA-C  omeprazole (PRILOSEC) 20 MG capsule Take 20 mg by mouth daily.    Historical Provider, MD   BP 117/66 mmHg  Pulse 74  Temp(Src) 98.1 F (36.7 C) (Oral)  Resp 20  Ht 5\' 7"  (1.702 m)  Wt 202 lb (91.627 kg)  BMI 31.63 kg/m2  SpO2 98% Physical Exam  Constitutional: She is oriented to person, place, and time. She appears well-developed and well-nourished. No distress.  HENT:  Head: Normocephalic and atraumatic.  Mouth/Throat: Oropharynx is clear and moist.  Eyes: Conjunctivae are normal.  Neck: Normal range of motion. Neck supple.  Cardiovascular: Normal rate, regular rhythm and normal heart sounds.   Pulmonary/Chest: Effort normal and breath sounds normal.  Abdominal: Soft. Bowel sounds are normal. There is no tenderness.  Genitourinary: Uterus normal. Cervix exhibits no motion tenderness, no discharge and no friability. Right adnexum displays no mass, no tenderness and no fullness. Left adnexum displays no mass, no tenderness and no fullness. No bleeding in the vagina. Vaginal discharge (thick, white, foul odor) found.    Two small black raised areas on cervix as shown in diagram.  Musculoskeletal: Normal range of motion. She exhibits no edema.  Neurological: She is alert and oriented to person, place, and time.  Skin: Skin is warm and dry. She is not diaphoretic.  Psychiatric: She has a normal mood and affect. Her behavior  is normal.  Nursing note and vitals reviewed.   ED Course  Procedures (including critical care time) Labs Review Labs Reviewed  WET PREP, GENITAL - Abnormal; Notable for the following:    Trich, Wet Prep MODERATE (*)    Clue Cells Wet Prep HPF POC TOO NUMEROUS TO COUNT (*)    WBC, Wet Prep HPF POC MODERATE (*)    All other components within normal limits  I-STAT BETA HCG BLOOD, ED ([facility], [facility], [facility] ONLY) - Abnormal; Notable for the following:    I-stat hCG, quantitative 5.2 (*)    All other components within normal limits  HIV ANTIBODY (ROUTINE TESTING)  POC URINE PREG, ED  GC/CHLAMYDIA PROBE AMP ([place])    Imaging Review No results found.   EKG Interpretation None      MDM   Final diagnoses:  Trichomonas vaginitis  BV (bacterial vaginosis)  Exposure to STD   NAD. GC/Chlamydia, HIV, RPR pending. Rocephin and Zithromax given. Wet prep positive for clue cells and trichomoniasis. Treat with Flagyl. Infection care/precautions discussed. Discussed importance of OB/GYN follow-up regarding spots on cervix. No CMT or adnexal tenderness. Doubt PID. Upreg obtained after istat hcg 5.2. Hx of tubal ligation and no menstrual for almost 1 year. Stable for d/c. Return precautions given. Patient states understanding of treatment care plan and is agreeable.  Kathrynn Speed, PA-C 08/27/14 1016  Doug Sou, MD 08/27/14 [phone]

## 2014-08-27 NOTE — ED Notes (Signed)
Pt given graham crackers, peanut butter, and Coca[employer]a

## 2014-08-28 LAB — HIV ANTIBODY (ROUTINE TESTING W REFLEX): HIV Screen 4th Generation wRfx: NONREACTIVE

## 2014-09-23 ENCOUNTER — Encounter: Payer: Medicaid Other | Admitting: Obstetrics & Gynecology

## 2014-10-12 ENCOUNTER — Encounter (HOSPITAL_COMMUNITY): Payer: Self-pay | Admitting: Emergency Medicine

## 2014-10-12 ENCOUNTER — Emergency Department (HOSPITAL_COMMUNITY)
Admission: EM | Admit: 2014-10-12 | Discharge: 2014-10-13 | Disposition: A | Discharge status: Home / Self Care | Payer: Medicaid Other | Attending: Emergency Medicine | Admitting: Emergency Medicine

## 2014-10-12 DIAGNOSIS — Z79899 Other long term (current) drug therapy: Secondary | ICD-10-CM | POA: Insufficient documentation

## 2014-10-12 DIAGNOSIS — K219 Gastro-esophageal reflux disease without esophagitis: Secondary | ICD-10-CM | POA: Insufficient documentation

## 2014-10-12 DIAGNOSIS — Z8739 Personal history of other diseases of the musculoskeletal system and connective tissue: Secondary | ICD-10-CM | POA: Insufficient documentation

## 2014-10-12 DIAGNOSIS — Z720 Tobacco use: Secondary | ICD-10-CM | POA: Insufficient documentation

## 2014-10-12 DIAGNOSIS — E860 Dehydration: Secondary | ICD-10-CM

## 2014-10-12 DIAGNOSIS — R197 Diarrhea, unspecified: Secondary | ICD-10-CM

## 2014-10-12 DIAGNOSIS — R42 Dizziness and giddiness: Secondary | ICD-10-CM

## 2014-10-12 DIAGNOSIS — Z3202 Encounter for pregnancy test, result negative: Secondary | ICD-10-CM | POA: Insufficient documentation

## 2014-10-12 DIAGNOSIS — Z8659 Personal history of other mental and behavioral disorders: Secondary | ICD-10-CM | POA: Insufficient documentation

## 2014-10-12 DIAGNOSIS — Z8679 Personal history of other diseases of the circulatory system: Secondary | ICD-10-CM | POA: Insufficient documentation

## 2014-10-12 DIAGNOSIS — R1084 Generalized abdominal pain: Secondary | ICD-10-CM | POA: Insufficient documentation

## 2014-10-12 LAB — CBC WITH DIFFERENTIAL/PLATELET
Basophils Absolute: 0 10*3/uL (ref 0.0–0.1)
Basophils Relative: 0 % (ref 0–1)
EOS PCT: 2 % (ref 0–5)
Eosinophils Absolute: 0.2 10*3/uL (ref 0.0–0.7)
HEMATOCRIT: 40.2 % (ref 36.0–46.0)
Hemoglobin: 13.2 g/dL (ref 12.0–15.0)
LYMPHS ABS: 3.2 10*3/uL (ref 0.7–4.0)
LYMPHS PCT: 31 % (ref 12–46)
MCH: 29 pg (ref 26.0–34.0)
MCHC: 32.8 g/dL (ref 30.0–36.0)
MCV: 88.4 fL (ref 78.0–100.0)
Monocytes Absolute: 0.7 10*3/uL (ref 0.1–1.0)
Monocytes Relative: 6 % (ref 3–12)
Neutro Abs: 6.2 10*3/uL (ref 1.7–7.7)
Neutrophils Relative %: 61 % (ref 43–77)
Platelets: 264 10*3/uL (ref 150–400)
RBC: 4.55 MIL/uL (ref 3.87–5.11)
RDW: 14.1 % (ref 11.5–15.5)
WBC: 10.2 10*3/uL (ref 4.0–10.5)

## 2014-10-12 LAB — I-STAT CHEM 8, ED
BUN: 14 mg/dL (ref 6–20)
Calcium, Ion: 1.3 mmol/L — ABNORMAL HIGH (ref 1.12–1.23)
Chloride: 105 mmol/L (ref 101–111)
Creatinine, Ser: 0.9 mg/dL (ref 0.44–1.00)
Glucose, Bld: 98 mg/dL (ref 65–99)
HEMATOCRIT: 43 % (ref 36.0–46.0)
HEMOGLOBIN: 14.6 g/dL (ref 12.0–15.0)
POTASSIUM: 3.6 mmol/L (ref 3.5–5.1)
Sodium: 143 mmol/L (ref 135–145)
TCO2: 23 mmol/L (ref 0–100)

## 2014-10-12 MED ORDER — SODIUM CHLORIDE 0.9 % IV BOLUS (SEPSIS)
1000.0000 mL | Freq: Once | INTRAVENOUS | Status: AC
  Administered 2014-10-12: 1000 mL via INTRAVENOUS

## 2014-10-12 NOTE — ED Notes (Signed)
Pt c/o diarrhea x's 2 days.  St's now she is starting to feel weak.  Denies nausea or vomiting

## 2014-10-13 LAB — URINE MICROSCOPIC-ADD ON

## 2014-10-13 LAB — COMPREHENSIVE METABOLIC PANEL
ALT: 16 U/L (ref 14–54)
AST: 18 U/L (ref 15–41)
Albumin: 3.3 g/dL — ABNORMAL LOW (ref 3.5–5.0)
Alkaline Phosphatase: 60 U/L (ref 38–126)
Anion gap: 8 (ref 5–15)
BUN: 13 mg/dL (ref 6–20)
CHLORIDE: 107 mmol/L (ref 101–111)
CO2: 24 mmol/L (ref 22–32)
Calcium: 9 mg/dL (ref 8.9–10.3)
Creatinine, Ser: 0.79 mg/dL (ref 0.44–1.00)
GFR calc Af Amer: >60 mL/min (ref >60)
GFR calc non Af Amer: >60 mL/min (ref >60)
GLUCOSE: 96 mg/dL (ref 65–99)
POTASSIUM: 3.6 mmol/L (ref 3.5–5.1)
SODIUM: 139 mmol/L (ref 135–145)
Total Bilirubin: 0.1 mg/dL — ABNORMAL LOW (ref 0.3–1.2)
Total Protein: 6.4 g/dL — ABNORMAL LOW (ref 6.5–8.1)

## 2014-10-13 LAB — URINALYSIS, ROUTINE W REFLEX MICROSCOPIC
BILIRUBIN URINE: NEGATIVE
Glucose, UA: NEGATIVE mg/dL
Ketones, ur: NEGATIVE mg/dL
Leukocytes, UA: NEGATIVE
NITRITE: NEGATIVE
PH: 5.5 (ref 5.0–8.0)
PROTEIN: NEGATIVE mg/dL
Specific Gravity, Urine: 1.029 (ref 1.005–1.030)
Urobilinogen, UA: 1 mg/dL (ref 0.0–1.0)

## 2014-10-13 LAB — CBC WITH DIFFERENTIAL/PLATELET
BASOS ABS: 0 10*3/uL (ref 0.0–0.1)
BASOS PCT: 1 % (ref 0–1)
EOS ABS: 0.1 10*3/uL (ref 0.0–0.7)
EOS PCT: 1 % (ref 0–5)
HCT: 38.7 % (ref 36.0–46.0)
Hemoglobin: 13.4 g/dL (ref 12.0–15.0)
Lymphocytes Relative: 20 % (ref 12–46)
Lymphs Abs: 1.8 10*3/uL (ref 0.7–4.0)
MCH: 30.3 pg (ref 26.0–34.0)
MCHC: 34.6 g/dL (ref 30.0–36.0)
MCV: 87.6 fL (ref 78.0–100.0)
MONO ABS: 0.6 10*3/uL (ref 0.1–1.0)
Monocytes Relative: 7 % (ref 3–12)
NEUTROS PCT: 71 % (ref 43–77)
Neutro Abs: 6.3 10*3/uL (ref 1.7–7.7)
PLATELETS: 177 10*3/uL (ref 150–400)
RBC: 4.42 MIL/uL (ref 3.87–5.11)
RDW: 13.5 % (ref 11.5–15.5)
WBC: 8.8 10*3/uL (ref 4.0–10.5)

## 2014-10-13 LAB — LIPASE, BLOOD: Lipase: 17 U/L — ABNORMAL LOW (ref 22–51)

## 2014-10-13 LAB — PREGNANCY, URINE: PREG TEST UR: NEGATIVE

## 2014-10-13 NOTE — Discharge Instructions (Signed)
Please follow-up with your doctor within a week. You may use Imodium over-the-counter for your diarrhea. Return to the ER if you develop any severe nausea, vomiting, abdominal pain, high fever greater than 100.75F.   Diarrhea Diarrhea is frequent loose and watery bowel movements. It can cause you to feel weak and dehydrated. Dehydration can cause you to become tired and thirsty, have a dry mouth, and have decreased urination that often is dark yellow. Diarrhea is a sign of another problem, most often an infection that will not last long. In most cases, diarrhea typically lasts 2-3 days. However, it can last longer if it is a sign of something more serious. It is important to treat your diarrhea as directed by your caregiver to lessen or prevent future episodes of diarrhea. CAUSES  Some common causes include:  Gastrointestinal infections caused by viruses, bacteria, or parasites.  Food poisoning or food allergies.  Certain medicines, such as antibiotics, chemotherapy, and laxatives.  Artificial sweeteners and fructose.  Digestive disorders. HOME CARE INSTRUCTIONS  Ensure adequate fluid intake (hydration): Have 1 cup (8 oz) of fluid for each diarrhea episode. Avoid fluids that contain simple sugars or sports drinks, fruit juices, whole milk products, and sodas. Your urine should be clear or pale yellow if you are drinking enough fluids. Hydrate with an oral rehydration solution that you can purchase at pharmacies, retail stores, and online. You can prepare an oral rehydration solution at home by mixing the following ingredients together:   - tsp table salt.   tsp baking soda.   tsp salt substitute containing potassium chloride.  1  tablespoons sugar.  1 L (34 oz) of water.  Certain foods and beverages may increase the speed at which food moves through the gastrointestinal (GI) tract. These foods and beverages should be avoided and include:  Caffeinated and alcoholic  beverages.  High-fiber foods, such as raw fruits and vegetables, nuts, seeds, and whole grain breads and cereals.  Foods and beverages sweetened with sugar alcohols, such as xylitol, sorbitol, and mannitol.  Some foods may be well tolerated and may help thicken stool including:  Starchy foods, such as rice, toast, pasta, low-sugar cereal, oatmeal, grits, baked potatoes, crackers, and bagels.  Bananas.  Applesauce.  Add probiotic-rich foods to help increase healthy bacteria in the GI tract, such as yogurt and fermented milk products.  Wash your hands well after each diarrhea episode.  Only take over-the-counter or prescription medicines as directed by your caregiver.  Take a warm bath to relieve any burning or pain from frequent diarrhea episodes. SEEK IMMEDIATE MEDICAL CARE IF:   You are unable to keep fluids down.  You have persistent vomiting.  You have blood in your stool, or your stools are black and tarry.  You do not urinate in 6-8 hours, or there is only a small amount of very dark urine.  You have abdominal pain that increases or localizes.  You have weakness, dizziness, confusion, or light-headedness.  You have a severe headache.  Your diarrhea gets worse or does not get better.  You have a fever or persistent symptoms for more than 2-3 days.  You have a fever and your symptoms suddenly get worse. MAKE SURE YOU:   Understand these instructions.  Will watch your condition.  Will get help right away if you are not doing well or get worse. Document Released: 04/14/2002 Document Revised: 09/08/2013 Document Reviewed: 12/31/2011 [facility] Patient Information 2015 [place], [place]. This information is not intended to replace advice given to  you by your health care provider. Make sure you discuss any questions you have with your health care provider.  Dehydration, Adult Dehydration is when you lose more fluids from the body than you take in. Vital organs like the  kidneys, brain, and heart cannot function without a proper amount of fluids and salt. Any loss of fluids from the body can cause dehydration.  CAUSES   Vomiting.  Diarrhea.  Excessive sweating.  Excessive urine output.  Fever. SYMPTOMS  Mild dehydration  Thirst.  Dry lips.  Slightly dry mouth. Moderate dehydration  Very dry mouth.  Sunken eyes.  Skin does not bounce back quickly when lightly pinched and released.  Dark urine and decreased urine production.  Decreased tear production.  Headache. Severe dehydration  Very dry mouth.  Extreme thirst.  Rapid, weak pulse (more than 100 beats per minute at rest).  Cold hands and feet.  Not able to sweat in spite of heat and temperature.  Rapid breathing.  Blue lips.  Confusion and lethargy.  Difficulty being awakened.  Minimal urine production.  No tears. DIAGNOSIS  Your caregiver will diagnose dehydration based on your symptoms and your exam. Blood and urine tests will help confirm the diagnosis. The diagnostic evaluation should also identify the cause of dehydration. TREATMENT  Treatment of mild or moderate dehydration can often be done at home by increasing the amount of fluids that you drink. It is best to drink small amounts of fluid more often. Drinking too much at one time can make vomiting worse. Refer to the home care instructions below. Severe dehydration needs to be treated at the hospital where you will probably be given intravenous (IV) fluids that contain water and electrolytes. HOME CARE INSTRUCTIONS   Ask your caregiver about specific rehydration instructions.  Drink enough fluids to keep your urine clear or pale yellow.  Drink small amounts frequently if you have nausea and vomiting.  Eat as you normally do.  Avoid:  Foods or drinks high in sugar.  Carbonated drinks.  Juice.  Extremely hot or cold fluids.  Drinks with caffeine.  Fatty, greasy  foods.  Alcohol.  Tobacco.  Overeating.  Gelatin desserts.  Wash your hands well to avoid spreading bacteria and viruses.  Only take over-the-counter or prescription medicines for pain, discomfort, or fever as directed by your caregiver.  Ask your caregiver if you should continue all prescribed and over-the-counter medicines.  Keep all follow-up appointments with your caregiver. SEEK MEDICAL CARE IF:  You have abdominal pain and it increases or stays in one area (localizes).  You have a rash, stiff neck, or severe headache.  You are irritable, sleepy, or difficult to awaken.  You are weak, dizzy, or extremely thirsty. SEEK IMMEDIATE MEDICAL CARE IF:   You are unable to keep fluids down or you get worse despite treatment.  You have frequent episodes of vomiting or diarrhea.  You have blood or green matter (bile) in your vomit.  You have blood in your stool or your stool looks black and tarry.  You have not urinated in 6 to 8 hours, or you have only urinated a small amount of very dark urine.  You have a fever.  You faint. MAKE SURE YOU:   Understand these instructions.  Will watch your condition.  Will get help right away if you are not doing well or get worse. Document Released: 04/24/2005 Document Revised: 07/17/2011 Document Reviewed: 12/12/2010 [facility] Patient Information 2015 [place], [place]. This information is not intended to replace  advice given to you by your health care provider. Make sure you discuss any questions you have with your health care provider. ° °

## 2014-10-13 NOTE — ED Provider Notes (Signed)
CSN: [id number]     Arrival date & [id number] 10/12/14  1959 History   First MD Initiated Contact with Patient 10/12/14 2226     Chief Complaint  Patient presents with  . Diarrhea     (Consider location/radiation/quality/duration/timing/severity/associated sxs/prior Treatment) HPI Teresa Faulkner is a 49 year-old female who presents the ER complaining of diarrhea. Patient reports multiple episodes of nonbloody, watery" diarrhea over the past 2 days. Patient reports associated mild lightheadedness on several occasions which has resolved spontaneously. Patient also reports mild, generalized abdominal pain. Patient denies nausea, vomiting, fever, chest pain, shortness of breath, headache, blurred vision, dizziness, weakness, dysuria.  Past Medical History  Diagnosis Date  . Arthritis   . GERD (gastroesophageal reflux disease)   . Bursitis   . Anxiety   . Migraine    Past Surgical History  Procedure Laterality Date  . Back surgery    . Tubal ligation     Family History  Problem Relation Age of Onset  . Diabetes Mother   . Diabetes Maternal Aunt   . Hypertension Maternal Aunt    History  Substance Use Topics  . Smoking status: Current Every Day Smoker -- 0.25 packs/day for 20 years    Types: Cigarettes  . Smokeless tobacco: Never Used  . Alcohol Use: No   OB History    Gravida Para Term Preterm AB TAB SAB Ectopic Multiple Living   2 2 2  0 0 0 0 0 0 2     Review of Systems  Constitutional: Negative for fever.  HENT: Negative for trouble swallowing.   Eyes: Negative for visual disturbance.  Respiratory: Negative for shortness of breath.   Cardiovascular: Negative for chest pain.  Gastrointestinal: Positive for abdominal pain and diarrhea. Negative for nausea and vomiting.  Genitourinary: Negative for dysuria.  Musculoskeletal: Negative for neck pain.  Skin: Negative for rash.  Neurological: Positive for light-headedness. Negative for dizziness, weakness and numbness.   Psychiatric/Behavioral: Negative.       Allergies  Review of patient's allergies indicates no known allergies.  Home Medications   Prior to Admission medications   Medication Sig Start Date End Date Taking? Authorizing Provider  ibuprofen (ADVIL,MOTRIN) 200 MG tablet Take 400 mg by mouth every 6 (six) hours as needed for moderate pain.   Yes Historical Provider, MD  omeprazole (PRILOSEC) 20 MG capsule Take 20 mg by mouth daily.   Yes Historical Provider, MD   BP 116/53 mmHg  Pulse 69  Temp(Src) 98.2 F (36.8 C) (Oral)  Resp 16  SpO2 100% Physical Exam  Constitutional: She is oriented to person, place, and time. She appears well-developed and well-nourished. No distress.  HENT:  Head: Normocephalic and atraumatic.  Mouth/Throat: Oropharynx is clear and moist. No oropharyngeal exudate.  Eyes: Right eye exhibits no discharge. Left eye exhibits no discharge. No scleral icterus.  Neck: Normal range of motion.  Cardiovascular: Normal rate, regular rhythm and normal heart sounds.   No murmur heard. Pulmonary/Chest: Effort normal and breath sounds normal. No respiratory distress.  Abdominal: Soft. There is generalized tenderness. There is no rigidity, no guarding, no tenderness at McBurney's point and negative Murphy's sign.  Mild, generalized tenderness  Musculoskeletal: Normal range of motion. She exhibits no edema or tenderness.  Neurological: She is alert and oriented to person, place, and time. She has normal strength. No cranial nerve deficit or sensory deficit. She displays a negative Romberg sign. Coordination and gait normal. GCS eye subscore is 4. GCS verbal subscore is 5. GCS  motor subscore is 6.  Patient fully alert, answering questions appropriately in full, clear sentences. Cranial nerves II through XII grossly intact. Motor strength 5 out of 5 in all major muscle groups of upper and lower extremities. Distal sensation intact.   Skin: Skin is warm and dry. No rash noted.  She is not diaphoretic.  Psychiatric: She has a normal mood and affect.  Nursing note and vitals reviewed.   ED Course  Procedures (including critical care time) Labs Review Labs Reviewed  COMPREHENSIVE METABOLIC PANEL - Abnormal; Notable for the following:    Total Protein 6.4 (*)    Albumin 3.3 (*)    Total Bilirubin 0.1 (*)    All other components within normal limits  URINALYSIS, ROUTINE W REFLEX MICROSCOPIC (NOT AT [facility]) - Abnormal; Notable for the following:    APPearance CLOUDY (*)    Hgb urine dipstick TRACE (*)    All other components within normal limits  LIPASE, BLOOD - Abnormal; Notable for the following:    Lipase 17 (*)    All other components within normal limits  URINE MICROSCOPIC-ADD ON - Abnormal; Notable for the following:    Squamous Epithelial / LPF MANY (*)    All other components within normal limits  I-STAT CHEM 8, ED - Abnormal; Notable for the following:    Calcium, Ion 1.30 (*)    All other components within normal limits  CBC WITH DIFFERENTIAL/PLATELET  PREGNANCY, URINE  CBC WITH DIFFERENTIAL/PLATELET    Imaging Review No results found.   EKG Interpretation   Date/Time:  Tuesday October 13 2014 00:29:08 EDT Ventricular Rate:  68 PR Interval:  203 QRS Duration: 97 QT Interval:  412 QTC Calculation: 438 R Axis:   72 Text Interpretation:  Sinus rhythm Borderline prolonged PR interval Low  voltage, precordial leads NO prior for comparison Confirmed by HORTON  MD,  COURTNEY ([id number]) on 10/13/2014 12:42:24 AM      MDM   Final diagnoses:  Diarrhea  Light headedness  Mild dehydration   2 days of diarrhea with associated mild lightheadedness, likely attributed to patient's mild dehydration. Patient is nontoxic, nonseptic appearing, in no apparent distress.  Patient's pain and other symptoms adequately managed in emergency department.  Fluid bolus given.  Labs, EKG and vitals reviewed.  Patient does not meet the SIRS or Sepsis criteria.  On repeat  exam patient does not have a surgical abdomin and there are no peritoneal signs.  No indication of appendicitis, bowel obstruction, bowel perforation, cholecystitis, diverticulitis, PID or ectopic pregnancy.  Patient discharged home with symptomatic treatment and given strict instructions for follow-up with their primary care physician.  I have also discussed reasons to return immediately to the ER.  Patient expresses understanding and agrees with plan.  BP 116/53 mmHg  Pulse 69  Temp(Src) 98.2 F (36.8 C) (Oral)  Resp 16  SpO2 100%  Signed,  Ladona Mow, PA-C 1:31 AM  Patient discussed with Dr. Ross Marcus, MD      Ladona Mow, PA-C 10/13/14 1914  Shon Baton, MD 10/13/14 [phone]

## 2014-10-13 NOTE — ED Notes (Signed)
Unable to obtain pt's signature at time of discharge, pt reports she understands discharge instructions.

## 2014-10-30 ENCOUNTER — Encounter: Payer: Medicaid Other | Admitting: Obstetrics & Gynecology

## 2014-10-30 ENCOUNTER — Telehealth: Payer: Self-pay | Admitting: *Deleted

## 2014-10-30 NOTE — Telephone Encounter (Signed)
Teresa Faulkner missed her appointment to follow up after ER referral for spots on her cervix.  [name] and she states she wasn't feeling well and wants to reschedule for another day in the afternoon.  I infomred her registars will call with another appointment.

## 2014-11-25 ENCOUNTER — Encounter (HOSPITAL_COMMUNITY): Payer: Self-pay | Admitting: General Practice

## 2014-11-25 ENCOUNTER — Emergency Department (HOSPITAL_COMMUNITY)
Admission: EM | Admit: 2014-11-25 | Discharge: 2014-11-25 | Disposition: A | Discharge status: Home / Self Care | Payer: Medicaid Other | Attending: Emergency Medicine | Admitting: Emergency Medicine

## 2014-11-25 DIAGNOSIS — K219 Gastro-esophageal reflux disease without esophagitis: Secondary | ICD-10-CM | POA: Insufficient documentation

## 2014-11-25 DIAGNOSIS — Z9851 Tubal ligation status: Secondary | ICD-10-CM | POA: Insufficient documentation

## 2014-11-25 DIAGNOSIS — Z79899 Other long term (current) drug therapy: Secondary | ICD-10-CM | POA: Insufficient documentation

## 2014-11-25 DIAGNOSIS — K279 Peptic ulcer, site unspecified, unspecified as acute or chronic, without hemorrhage or perforation: Secondary | ICD-10-CM

## 2014-11-25 DIAGNOSIS — R110 Nausea: Secondary | ICD-10-CM

## 2014-11-25 DIAGNOSIS — M1990 Unspecified osteoarthritis, unspecified site: Secondary | ICD-10-CM | POA: Insufficient documentation

## 2014-11-25 DIAGNOSIS — Z8659 Personal history of other mental and behavioral disorders: Secondary | ICD-10-CM | POA: Insufficient documentation

## 2014-11-25 DIAGNOSIS — Z791 Long term (current) use of non-steroidal anti-inflammatories (NSAID): Secondary | ICD-10-CM | POA: Insufficient documentation

## 2014-11-25 DIAGNOSIS — Z720 Tobacco use: Secondary | ICD-10-CM | POA: Insufficient documentation

## 2014-11-25 DIAGNOSIS — K2970 Gastritis, unspecified, without bleeding: Secondary | ICD-10-CM | POA: Insufficient documentation

## 2014-11-25 DIAGNOSIS — Z8679 Personal history of other diseases of the circulatory system: Secondary | ICD-10-CM | POA: Insufficient documentation

## 2014-11-25 LAB — CBC WITH DIFFERENTIAL/PLATELET
BASOS ABS: 0 10*3/uL (ref 0.0–0.1)
Basophils Relative: 0 % (ref 0–1)
Eosinophils Absolute: 0.2 10*3/uL (ref 0.0–0.7)
Eosinophils Relative: 2 % (ref 0–5)
HCT: 41.8 % (ref 36.0–46.0)
Hemoglobin: 13.7 g/dL (ref 12.0–15.0)
Lymphocytes Relative: 40 % (ref 12–46)
Lymphs Abs: 4.3 10*3/uL — ABNORMAL HIGH (ref 0.7–4.0)
MCH: 29 pg (ref 26.0–34.0)
MCHC: 32.8 g/dL (ref 30.0–36.0)
MCV: 88.6 fL (ref 78.0–100.0)
Monocytes Absolute: 0.5 10*3/uL (ref 0.1–1.0)
Monocytes Relative: 5 % (ref 3–12)
Neutro Abs: 5.7 10*3/uL (ref 1.7–7.7)
Neutrophils Relative %: 53 % (ref 43–77)
Platelets: 255 10*3/uL (ref 150–400)
RBC: 4.72 MIL/uL (ref 3.87–5.11)
RDW: 14 % (ref 11.5–15.5)
WBC: 10.8 10*3/uL — ABNORMAL HIGH (ref 4.0–10.5)

## 2014-11-25 LAB — COMPREHENSIVE METABOLIC PANEL
ALT: 17 U/L (ref 14–54)
ANION GAP: 6 (ref 5–15)
AST: 20 U/L (ref 15–41)
Albumin: 3.5 g/dL (ref 3.5–5.0)
Alkaline Phosphatase: 60 U/L (ref 38–126)
BILIRUBIN TOTAL: 0.5 mg/dL (ref 0.3–1.2)
BUN: 7 mg/dL (ref 6–20)
CALCIUM: 9.3 mg/dL (ref 8.9–10.3)
CO2: 25 mmol/L (ref 22–32)
Chloride: 109 mmol/L (ref 101–111)
Creatinine, Ser: 0.8 mg/dL (ref 0.44–1.00)
GFR calc Af Amer: >60 mL/min (ref >60)
GFR calc non Af Amer: >60 mL/min (ref >60)
Glucose, Bld: 95 mg/dL (ref 65–99)
Potassium: 4 mmol/L (ref 3.5–5.1)
Sodium: 140 mmol/L (ref 135–145)
Total Protein: 6.4 g/dL — ABNORMAL LOW (ref 6.5–8.1)

## 2014-11-25 LAB — I-STAT TROPONIN, ED: Troponin i, poc: 0 ng/mL (ref 0.00–0.08)

## 2014-11-25 LAB — I-STAT BETA HCG BLOOD, ED (MC, WL, AP ONLY)

## 2014-11-25 LAB — LIPASE, BLOOD: Lipase: 16 U/L — ABNORMAL LOW (ref 22–51)

## 2014-11-25 MED ORDER — ONDANSETRON HCL 8 MG PO TABS: 8.0000 mg | ORAL_TABLET | Freq: Three times a day (TID) | ORAL | Status: DC | PRN

## 2014-11-25 MED ORDER — OMEPRAZOLE 20 MG PO CPDR: 20.0000 mg | DELAYED_RELEASE_CAPSULE | Freq: Every day | ORAL | Status: DC

## 2014-11-25 MED ORDER — SODIUM CHLORIDE 0.9 % IV BOLUS (SEPSIS)
1000.0000 mL | Freq: Once | INTRAVENOUS | Status: AC
  Administered 2014-11-25: 1000 mL via INTRAVENOUS

## 2014-11-25 MED ORDER — MORPHINE SULFATE 4 MG/ML IJ SOLN
4.0000 mg | Freq: Once | INTRAMUSCULAR | Status: AC
  Administered 2014-11-25: 4 mg via INTRAVENOUS
  Filled 2014-11-25: qty 1

## 2014-11-25 MED ORDER — ONDANSETRON HCL 4 MG/2ML IJ SOLN
4.0000 mg | Freq: Once | INTRAMUSCULAR | Status: AC
  Administered 2014-11-25: 4 mg via INTRAVENOUS
  Filled 2014-11-25: qty 2

## 2014-11-25 MED ORDER — PANTOPRAZOLE SODIUM 40 MG IV SOLR
40.0000 mg | Freq: Once | INTRAVENOUS | Status: AC
  Administered 2014-11-25: 40 mg via INTRAVENOUS
  Filled 2014-11-25: qty 40

## 2014-11-25 MED ORDER — HYDROCODONE-ACETAMINOPHEN 5-325 MG PO TABS: 1.0000 | ORAL_TABLET | Freq: Four times a day (QID) | ORAL | Status: DC | PRN

## 2014-11-25 MED ORDER — GI COCKTAIL ~~LOC~~
30.0000 mL | Freq: Once | ORAL | Status: AC
Start: 2014-11-25 — End: 2014-11-25
  Administered 2014-11-25: 30 mL via ORAL
  Filled 2014-11-25: qty 30

## 2014-11-25 NOTE — ED Provider Notes (Signed)
CSN: [id number]     Arrival date & time 11/25/14  1031 History   First MD Initiated Contact with Patient 11/25/14 1038     Chief Complaint  Patient presents with  . Abdominal Pain     (Consider location/radiation/quality/duration/timing/severity/associated sxs/prior Treatment) HPI Comments: Teresa Faulkner is a 49 y.o. female with a PMHx of GERD, anxiety, migraines, and arthritis, with a PSHx of tubal ligation, who presents to the ED with complaints of epigastric pain that began around 9 PM yesterday. She reports 9/10 sharp intermittent nonradiating pain, waxing and waning, worse with ibuprofen, mildly improved with eating a meal but then shortly thereafter it worsened again, with no other treatments tried prior to arrival. Associated symptoms include nausea. She states that the pain initially started 3 hours after a meal, and when she ate another meal it helped the pain but then quickly thereafter it worse. Her last meal was at 8 PM, she ate [place] food. She endorses chronic NSAID use. She reports that she has a history of GERD and is no longer taking Nexium or Prevacid, she has not followed up with her regular doctor for ongoing care for her GERD. She denies any fevers, chills, chest pain, shortness breath, vomiting, diarrhea, constipation, obstipation, melena, hematochezia, flank pain, dysuria, hematuria, vaginal bleeding or discharge, arthralgias, myalgias, numbness, tingling, weakness, rashes, sick contacts, recent travel, suspicious food intake, antibiotic use, or alcohol use. LMP was approximately 1 year ago, she states she thinks she's entering into menopause.   Patient is a 49 y.o. female presenting with abdominal pain. The history is provided by the patient. No language interpreter was used.  Abdominal Pain Pain location:  Epigastric Pain quality: sharp   Pain radiates to:  Does not radiate Pain severity:  Severe Onset quality:  Gradual Duration:  13 hours Timing:   Intermittent Progression:  Waxing and waning Chronicity:  New Context: not alcohol use, not recent illness, not recent travel, not sick contacts and not suspicious food intake   Context comment:  +NSAID use Relieved by:  Eating (briefly improved after eating a meal, then worsened) Worsened by:  NSAIDs Ineffective treatments:  None tried Associated symptoms: nausea   Associated symptoms: no chest pain, no chills, no constipation, no diarrhea, no dysuria, no fever, no flatus, no hematemesis, no hematochezia, no hematuria, no melena, no shortness of breath, no vaginal bleeding, no vaginal discharge and no vomiting   Risk factors: NSAID use   Risk factors: no alcohol abuse and has not had multiple surgeries     Past Medical History  Diagnosis Date  . Arthritis   . GERD (gastroesophageal reflux disease)   . Bursitis   . Anxiety   . Migraine    Past Surgical History  Procedure Laterality Date  . Back surgery    . Tubal ligation     Family History  Problem Relation Age of Onset  . Diabetes Mother   . Diabetes Maternal Aunt   . Hypertension Maternal Aunt    History  Substance Use Topics  . Smoking status: Current Every Day Smoker -- 0.25 packs/day for 20 years    Types: Cigarettes  . Smokeless tobacco: Never Used  . Alcohol Use: No   OB History    Gravida Para Term Preterm AB TAB SAB Ectopic Multiple Living   2 2 2  0 0 0 0 0 0 2     Review of Systems  Constitutional: Negative for fever and chills.  Respiratory: Negative for shortness of breath.  Cardiovascular: Negative for chest pain.  Gastrointestinal: Positive for nausea and abdominal pain. Negative for vomiting, diarrhea, constipation, blood in stool, melena, hematochezia, flatus and hematemesis.  Genitourinary: Negative for dysuria, hematuria, flank pain, vaginal bleeding and vaginal discharge.  Musculoskeletal: Negative for myalgias and arthralgias.  Skin: Negative for color change.  Allergic/Immunologic: Negative  for immunocompromised state.  Neurological: Negative for weakness and numbness.  Psychiatric/Behavioral: Negative for confusion.   10 Systems reviewed and are negative for acute change except as noted in the HPI.    Allergies  Review of patient's allergies indicates no known allergies.  Home Medications   Prior to Admission medications   Medication Sig Start Date End Date Taking? Authorizing Provider  ibuprofen (ADVIL,MOTRIN) 200 MG tablet Take 400 mg by mouth every 6 (six) hours as needed for moderate pain.    Historical Provider, MD  omeprazole (PRILOSEC) 20 MG capsule Take 20 mg by mouth daily.    Historical Provider, MD   BP 143/80 mmHg  Pulse 83  Temp(Src) 98.1 F (36.7 C) (Oral)  Resp 18  Ht 5' 7.5" (1.715 m)  Wt 202 lb (91.627 kg)  BMI 31.15 kg/m2  SpO2 100% Physical Exam  Constitutional: She is oriented to person, place, and time. Vital signs are normal. She appears well-developed and well-nourished.  Non-toxic appearance. No distress.  Afebrile, nontoxic, NAD  HENT:  Head: Normocephalic and atraumatic.  Mouth/Throat: Oropharynx is clear and moist and mucous membranes are normal.  Eyes: Conjunctivae and EOM are normal. Right eye exhibits no discharge. Left eye exhibits no discharge.  Neck: Normal range of motion. Neck supple.  Cardiovascular: Normal rate, regular rhythm, normal heart sounds and intact distal pulses.  Exam reveals no gallop and no friction rub.   No murmur heard. Pulmonary/Chest: Effort normal and breath sounds normal. No respiratory distress. She has no decreased breath sounds. She has no wheezes. She has no rhonchi. She has no rales.  Abdominal: Soft. Normal appearance and bowel sounds are normal. She exhibits no distension. There is tenderness in the epigastric area. There is no rigidity, no rebound, no guarding, no CVA tenderness, no tenderness at McBurney's point and negative Murphy's sign.    Soft, nondistended, +BS throughout, with epigastric  TTP, no r/g/r, neg murphy's, neg mcburney's, no CVA TTP   Musculoskeletal: Normal range of motion.  Neurological: She is alert and oriented to person, place, and time. She has normal strength. No sensory deficit.  Skin: Skin is warm, dry and intact. No rash noted.  Psychiatric: She has a normal mood and affect.  Nursing note and vitals reviewed.   ED Course  Procedures (including critical care time) Labs Review Labs Reviewed  CBC WITH DIFFERENTIAL/PLATELET - Abnormal; Notable for the following:    WBC 10.8 (*)    Lymphs Abs 4.3 (*)    All other components within normal limits  COMPREHENSIVE METABOLIC PANEL - Abnormal; Notable for the following:    Total Protein 6.4 (*)    All other components within normal limits  LIPASE, BLOOD - Abnormal; Notable for the following:    Lipase 16 (*)    All other components within normal limits  I-STAT TROPOININ, ED  I-STAT BETA HCG BLOOD, ED ([facility], [facility], [facility] ONLY)    Imaging Review No results found.   EKG Interpretation None      MDM   Final diagnoses:  Gastritis  Gastroesophageal reflux disease, esophagitis presence not specified  PUD (peptic ulcer disease)  Nausea    49 y.o. female  here with epigastric pain and nausea. Hx of GERD, not on any medications. Pain started 3hrs after a meal, improved with eating a meal but then worsened shortly after eating. Abd tender in epigastrum, neg murphy's, nonperitoneal. Symptoms c/w gastritis/PUD. +NSAID use chronically. Will get labs, EKG/trop, and give gi cocktail, protonix, morphine, zofran, and fluids. No pelvic complaints and no lower abd tenderness, doubt need for pelvic. Pt in menopause, no menses in nearly 53yr, will obtain upreg (since Istat lab is down) just to ensure no pregnancy. Will reassess shortly.   11:12 AM Nursing changed order to betaHCG since pt doesn't feel like urinating at this time. Discussed that given no urinary complaints, doubt need for urine otherwise. Will cancel this order.  Istats aren't crossing over, but still work. Hand delivered results for troponin showing Trop 0.00. CBC returning relatively unremarkable. Awaiting betaHCG result, and other labs. EKG unremarkable. Will reassess shortly.   11:49 AM Beta HCG neg. CMP unremarkable, lipase WNL. Pain improved, nausea improved. Will PO challenge. Likely gastritis/PUD, discussed starting back on prilosec, tums PRN, and will send home with zofran and some pain meds. Discussed avoidance of spicy/fatty/acidic foods, avoidance of alcohol, and lifestyle modifications to help with GERD/PUD symptoms. Will have her f/up with [facility] in 1-2 weeks for recheck, pt does not have insurance and want to try to handle this through PCP before going to GI. Will ensure she can tolerate PO now, and then likely d/c.  12:28 PM Tolerating PO well. Will d/c home with previously discussed plan. I explained the diagnosis and have given explicit precautions to return to the ER including for any other new or worsening symptoms. The patient understands and accepts the medical plan as it's been dictated and I have answered their questions. Discharge instructions concerning home care and prescriptions have been given. The patient is STABLE and is discharged to home in good condition.  BP 120/57 mmHg  Pulse 60  Temp(Src) 98.1 F (36.7 C) (Oral)  Resp 14  Ht 5' 7.5" (1.715 m)  Wt 202 lb (91.627 kg)  BMI 31.15 kg/m2  SpO2 100%  Meds ordered this encounter  Medications  . gi cocktail (Maalox,Lidocaine,Donnatal)    Sig:   . pantoprazole (PROTONIX) injection 40 mg    Sig:   . morphine 4 MG/ML injection 4 mg    Sig:   . ondansetron (ZOFRAN) injection 4 mg    Sig:   . sodium chloride 0.9 % bolus 1,000 mL    Sig:   . omeprazole (PRILOSEC) 20 MG capsule    Sig: Take 1 capsule (20 mg total) by mouth daily.    Dispense:  30 capsule    Refill:  0    Order Specific Question:  Supervising Provider    Answer:  MILLER, BRIAN [[id number]]  . ondansetron  (ZOFRAN) 8 MG tablet    Sig: Take 1 tablet (8 mg total) by mouth every 8 (eight) hours as needed for nausea or vomiting.    Dispense:  10 tablet    Refill:  0    Order Specific Question:  Supervising Provider    Answer:  Hyacinth Meeker, BRIAN [[id number]]  . HYDROcodone-acetaminophen (NORCO) 5-325 MG per tablet    Sig: Take 1 tablet by mouth every 6 (six) hours as needed for severe pain.    Dispense:  6 tablet    Refill:  0    Order Specific Question:  Supervising Provider    Answer:  Hyacinth Meeker, BRIAN [[id number]]     Teresa Miceli Camprubi-Soms,  PA-C 11/25/14 1229  Blane Ohara, MD 11/28/14 [phone]

## 2014-11-25 NOTE — ED Notes (Signed)
Results from i-stat troponin received from mini-lab: 0.00.  Delivered to Levi StraussMercedes Camprubi-Soms, [place].

## 2014-11-25 NOTE — ED Notes (Signed)
Pt provided ginger ale for PO challenge  

## 2014-11-25 NOTE — Discharge Instructions (Signed)
Your abdominal pain is likely from gastritis or an ulcer. You will need to take prilosec as directed, and avoid spicy/fatty/acidic foods. Avoid laying down flat within 30 minutes of eating. Avoid NSAIDs like ibuprofen on an empty stomach. Use zofran as needed for nausea. Use norco as needed for pain but don't drive or operate machinery while taking this medication. Follow up with [facility] in 1-2 weeks for ongoing evaluation of your abdominal pain and to establish medical care. Return to the ER for changes or worsening symptoms.  Abdominal (belly) pain can be caused by many things. Your caregiver performed an examination and possibly ordered blood/urine tests and imaging (CT scan, x-rays, ultrasound). Many cases can be observed and treated at home after initial evaluation in the emergency department. Even though you are being discharged home, abdominal pain can be unpredictable. Therefore, you need a repeated exam if your pain does not resolve, returns, or worsens. Most patients with abdominal pain don't have to be admitted to the hospital or have surgery, but serious problems like appendicitis and gallbladder attacks can start out as nonspecific pain. Many abdominal conditions cannot be diagnosed in one visit, so follow-up evaluations are very important. SEEK IMMEDIATE MEDICAL ATTENTION IF YOU DEVELOP ANY OF THE FOLLOWING SYMPTOMS:  The pain does not go away or becomes severe.   A temperature above 101 develops.   Repeated vomiting occurs (multiple episodes).   The pain becomes localized to portions of the abdomen. The right side could possibly be appendicitis. In an adult, the left lower portion of the abdomen could be colitis or diverticulitis.   Blood is being passed in stools or vomit (bright red or black tarry stools).   Return also if you develop chest pain, difficulty breathing, dizziness or fainting, or become confused, poorly responsive, or inconsolable (young  children).  The constipation stays for more than 4 days.   There is belly (abdominal) or rectal pain.   You do not seem to be getting better.      Gastritis, Adult Gastritis is soreness and puffiness (inflammation) of the lining of the stomach. If you do not get help, gastritis can cause bleeding and sores (ulcers) in the stomach. HOME CARE   Only take medicine as told by your doctor.  If you were given antibiotic medicines, take them as told. Finish the medicines even if you start to feel better.  Drink enough fluids to keep your pee (urine) clear or pale yellow.  Avoid foods and drinks that make your problems worse. Foods you may want to avoid include:  Caffeine or alcohol.  Chocolate.  Mint.  Garlic and onions.  Spicy foods.  Citrus fruits, including oranges, lemons, or limes.  Food containing tomatoes, including sauce, chili, salsa, and pizza.  Fried and fatty foods.  Eat small meals throughout the day instead of large meals. GET HELP RIGHT AWAY IF:   You have black or dark red poop (stools).  You throw up (vomit) blood. It may look like coffee grounds.  You cannot keep fluids down.  Your belly (abdominal) pain gets worse.  You have a fever.  You do not feel better after 1 week.  You have any other questions or concerns. MAKE SURE YOU:   Understand these instructions.  Will watch your condition.  Will get help right away if you are not doing well or get worse. Document Released: 10/11/2007 Document Revised: 07/17/2011 Document Reviewed: 06/07/2011 [facility] Patient Information 2015 [place], [place]. This information is not intended  to replace advice given to you by your health care provider. Make sure you discuss any questions you have with your health care provider.  Gastroesophageal Reflux Disease, Adult Gastroesophageal reflux disease (GERD) happens when acid from your stomach goes into your food pipe (esophagus). The acid can cause a burning  feeling in your chest. Over time, the acid can make small holes (ulcers) in your food pipe.  HOME CARE  Ask your doctor for advice about:  Losing weight.  Quitting smoking.  Alcohol use.  Avoid foods and drinks that make your problems worse. You may want to avoid:  Caffeine and alcohol.  Chocolate.  Mints.  Garlic and onions.  Spicy foods.  Citrus fruits, such as oranges, lemons, or limes.  Foods that contain tomato, such as sauce, chili, salsa, and pizza.  Fried and fatty foods.  Avoid lying down for 3 hours before you go to bed or before you take a nap.  Eat small meals often, instead of large meals.  Wear loose-fitting clothing. Do not wear anything tight around your waist.  Raise (elevate) the head of your bed 6 to 8 inches with wood blocks. Using extra pillows does not help.  Only take medicines as told by your doctor.  Do not take aspirin or ibuprofen. GET HELP RIGHT AWAY IF:   You have pain in your arms, neck, jaw, teeth, or back.  Your pain gets worse or changes.  You feel sick to your stomach (nauseous), throw up (vomit), or sweat (diaphoresis).  You feel short of breath, or you pass out (faint).  Your throw up is green, yellow, black, or looks like coffee grounds or blood.  Your poop (stool) is red, bloody, or black. MAKE SURE YOU:   Understand these instructions.  Will watch your condition.  Will get help right away if you are not doing well or get worse. Document Released: 10/11/2007 Document Revised: 07/17/2011 Document Reviewed: 11/11/2010 [facility] Patient Information 2015 [place], [place]. This information is not intended to replace advice given to you by your health care provider. Make sure you discuss any questions you have with your health care provider.  Peptic Ulcer A peptic ulcer is a painful sore in the lining of your esophagus, stomach, or in the first part of your small intestine. The main causes of an ulcer can be:  An  infection.  Using certain pain medicines too often or too much.  Smoking. HOME CARE  Avoid smoking, alcohol, and caffeine.  Avoid foods that bother you.  Only take medicine as told by your doctor. Do not take any medicines your doctor has not approved.  Keep all doctor visits as told. GET HELP IF:  You do not get better in 7 days after starting treatment.  You keep having an upset stomach (indigestion) or heartburn. GET HELP RIGHT AWAY IF:  You have sudden, sharp, or lasting belly (abdominal) pain.  You have bloody, black, or tarry poop (stool).  You throw up (vomit) blood or your throw up looks like coffee grounds.  You get light-headed, weak, or feel like you will pass out (faint).  You get sweaty or feel sticky and cold to the touch (clammy). MAKE SURE YOU:   Understand these instructions.  Will watch your condition.  Will get help right away if you are not doing well or get worse. Document Released: 07/19/2009 Document Revised: 09/08/2013 Document Reviewed: 11/22/2011 [facility] Patient Information 2015 [place], [place]. This information is not intended to replace advice given to you by  your health care provider. Make sure you discuss any questions you have with your health care provider.  Food Choices for Peptic Ulcer Disease When you have peptic ulcer disease, the foods you eat and your eating habits are very important. Choosing the right foods can help ease the discomfort of peptic ulcer disease. WHAT GENERAL GUIDELINES DO I NEED TO FOLLOW?  Choose fruits, vegetables, whole grains, and low-fat meat, fish, and poultry.   Keep a food diary to identify foods that cause symptoms.  Avoid foods that cause irritation or pain. These may be different for different people.  Eat frequent small meals instead of three large meals each day. The pain may be worse when your stomach is empty.  Avoid eating close to bedtime. WHAT FOODS ARE NOT RECOMMENDED? The following are  some foods and drinks that may worsen your symptoms:  Black, white, and red pepper.  Hot sauce.  Chili peppers.  Chili powder.  Chocolate and cocoa.   Alcohol.  Tea, coffee, and cola (regular and decaffeinated). The items listed above may not be a complete list of foods and beverages to avoid. Contact your dietitian for more information. Document Released: 07/17/2011 Document Revised: 04/29/2013 Document Reviewed: 02/26/2013 [facility] Patient Information 2015 [place], [place]. This information is not intended to replace advice given to you by your health care provider. Make sure you discuss any questions you have with your health care provider.  Food Choices for Gastroesophageal Reflux Disease When you have gastroesophageal reflux disease (GERD), the foods you eat and your eating habits are very important. Choosing the right foods can help ease the discomfort of GERD. WHAT GENERAL GUIDELINES DO I NEED TO FOLLOW?  Choose fruits, vegetables, whole grains, low-fat dairy products, and low-fat meat, fish, and poultry.  Limit fats such as oils, salad dressings, butter, nuts, and avocado.  Keep a food diary to identify foods that cause symptoms.  Avoid foods that cause reflux. These may be different for different people.  Eat frequent small meals instead of three large meals each day.  Eat your meals slowly, in a relaxed setting.  Limit fried foods.  Cook foods using methods other than frying.  Avoid drinking alcohol.  Avoid drinking large amounts of liquids with your meals.  Avoid bending over or lying down until 2-3 hours after eating. WHAT FOODS ARE NOT RECOMMENDED? The following are some foods and drinks that may worsen your symptoms: Vegetables Tomatoes. Tomato juice. Tomato and spaghetti sauce. Chili peppers. Onion and garlic. Horseradish. Fruits Oranges, grapefruit, and lemon (fruit and juice). Meats High-fat meats, fish, and poultry. This includes hot dogs, ribs,  ham, sausage, salami, and bacon. Dairy Whole milk and chocolate milk. Sour cream. Cream. Butter. Ice cream. Cream cheese.  Beverages Coffee and tea, with or without caffeine. Carbonated beverages or energy drinks. Condiments Hot sauce. Barbecue sauce.  Sweets/Desserts Chocolate and cocoa. Donuts. Peppermint and spearmint. Fats and Oils High-fat foods, including [place] fries and potato chips. Other Vinegar. Strong spices, such as black pepper, white pepper, red pepper, cayenne, curry powder, cloves, ginger, and chili powder. The items listed above may not be a complete list of foods and beverages to avoid. Contact your dietitian for more information. Document Released: 04/24/2005 Document Revised: 04/29/2013 Document Reviewed: 02/26/2013 [facility] Patient Information 2015 [place], [place]. This information is not intended to replace advice given to you by your health care provider. Make sure you discuss any questions you have with your health care provider.  Nausea, Adult Nausea means you feel sick  to your stomach or need to throw up (vomit). It may be a sign of a more serious problem. If nausea gets worse, you may throw up. If you throw up a lot, you may lose too much body fluid (dehydration). HOME CARE   Get plenty of rest.  Ask your doctor how to replace body fluid losses (rehydrate).  Eat small amounts of food. Sip liquids more often.  Take all medicines as told by your doctor. GET HELP RIGHT AWAY IF:  You have a fever.  You pass out (faint).  You keep throwing up or have blood in your throw up.  You are very weak, have dry lips or a dry mouth, or you are very thirsty (dehydrated).  You have dark or bloody poop (stool).  You have very bad chest or belly (abdominal) pain.  You do not get better after 2 days, or you get worse.  You have a headache. MAKE SURE YOU:  Understand these instructions.  Will watch your condition.  Will get help right away if you are not  doing well or get worse. Document Released: 04/13/2011 Document Revised: 07/17/2011 Document Reviewed: 04/13/2011 [facility] Patient Information 2015 [place], [place]. This information is not intended to replace advice given to you by your health care provider. Make sure you discuss any questions you have with your health care provider.

## 2014-11-25 NOTE — ED Notes (Signed)
Pt presents with upper abdominal pain that started last night around 2100. Pt rating pain a 9/10. Pt denies any vomiting but reports feeling nauseated. Pt describes pain as an intermittent sharp pain.

## 2014-11-30 ENCOUNTER — Other Ambulatory Visit (HOSPITAL_COMMUNITY)
Admission: RE | Admit: 2014-11-30 | Discharge: 2014-11-30 | Disposition: A | Discharge status: Home / Self Care | Payer: Medicaid Other | Source: Ambulatory Visit | Attending: Family Medicine | Admitting: Family Medicine

## 2014-11-30 ENCOUNTER — Ambulatory Visit (INDEPENDENT_AMBULATORY_CARE_PROVIDER_SITE_OTHER): Payer: Self-pay | Admitting: Family Medicine

## 2014-11-30 ENCOUNTER — Encounter: Payer: Self-pay | Admitting: Family Medicine

## 2014-11-30 VITALS — BP 120/65 | HR 78 | Temp 98.6°F | Wt 208.5 lb

## 2014-11-30 DIAGNOSIS — N889 Noninflammatory disorder of cervix uteri, unspecified: Secondary | ICD-10-CM

## 2014-11-30 DIAGNOSIS — N9489 Other specified conditions associated with female genital organs and menstrual cycle: Secondary | ICD-10-CM | POA: Insufficient documentation

## 2014-11-30 DIAGNOSIS — N898 Other specified noninflammatory disorders of vagina: Secondary | ICD-10-CM

## 2014-11-30 NOTE — Progress Notes (Signed)
    Subjective:    Patient ID: Teresa Faulkner is a 49 y.o. female presenting with Follow-up  on 11/30/2014  HPI: Reports 1 month s/o vaginal discharge.  Sometimes quite heavy. Seen in ED 4/16 with noted lesions on cervix.  Last pap was WNL 2014 with Neg HPV.  Review of Systems  Constitutional: Negative for fever and chills.  Respiratory: Negative for shortness of breath.   Cardiovascular: Negative for chest pain.  Gastrointestinal: Negative for nausea, vomiting and abdominal pain.  Genitourinary: Positive for vaginal discharge (wihtout irritation). Negative for dysuria.  Skin: Negative for rash.      Objective:    BP 120/65 mmHg  Pulse 78  Temp(Src) 98.6 F (37 C)  Wt 208 lb 8 oz (94.575 kg) Physical Exam  Constitutional: She is oriented to person, place, and time. She appears well-developed and well-nourished. No distress.  HENT:  Head: Normocephalic and atraumatic.  Eyes: No scleral icterus.  Neck: Neck supple.  Cardiovascular: Normal rate.   Pulmonary/Chest: Effort normal.  Abdominal: Soft.  Genitourinary: Vaginal discharge (white) found.    Neurological: She is alert and oriented to person, place, and time.  Skin: Skin is warm and dry.  Psychiatric: She has a normal mood and affect.   Procedure: Cervix visualized and areas of hyperpigmentation noted.   Punch forcep applied to cervix and biopsy obtained. Hemostasis obtained with Monsel's solution.      Assessment & Plan:   Problem List Items Addressed This Visit    None    Visit Diagnoses    Vaginal discharge    -  Primary    Relevant Orders    Wet prep, genital (Completed)    Cervical lesion        Relevant Orders    Surgical pathology      Return in about 3 months (around 03/02/2015), or if symptoms worsen or fail to improve.   Andoni Busch S 11/30/2014 3:08 PM

## 2014-11-30 NOTE — Patient Instructions (Signed)
Cervical Biopsy °A cervical biopsy is the removal of a small sample of tissue from the cervix. The cervix is the lowest part of the womb (uterus). The womb contains the opening of the uterus into the vagina (birth canal). A cervical biopsy is usually done to detect cancer of the cervix. A cervical biopsy is also done following an abnormal pap smear, or when an abnormality is seen on the cervix during a pelvic examination. °A pap smear is a test of your cervix and cervical canal. During a pap smear, your caregiver uses a small spatula and a brush. He/she uses these tools to gently scrape cells from inside the canal of the cervix and from the surface of your cervix. These cells are sent to a lab for testing. The pap smear is a screening test to detect early changes in cells which suggest cancer will develop. Abnormal pap smears often lead to a colposcopy. °A colposcopy is an examination of the surface of the cervix through a magnifying scope. It is usually done if: °· You have an abnormal pap smear. °· You have a lesion on the cervix, vulva, or vagina, with or without an abnormal pap smear. °· Your pap smear suggests the presence of the herpes virus or the human papilloma virus (HPV). This virus can cause genital warts. Several HPV types are linked to the development of cervical cancer. °· You have extensive genital warts on the vulva. These are the lips at the opening of the vagina. °· You were exposed to DES, or diethylstilbestrol during your mother's pregnancy. This medicine has been linked with abnormal changes in the cervix of women exposed to DES as fetuses. °· Your caregiver can safely perform a cervical biopsy while you are pregnant. Your caregiver will often wait until a time after delivery, if the pap smear does not indicate cancer cells. °PROCEDURE  °Do not douche or have sexual intercourse for at least 24 hours before doing the biopsy. A cervical biopsy is done with the woman lying on her back. Her feet  will be placed in stirrups (foot rests). The biopsy is done when a woman does not have her menstrual period. The caregiver places a speculum inside the woman's vagina. This instrument helps hold open the opening of the vagina. This allows the caregiver to see the cervix and inside of the vagina. °The caregiver uses a colposcope to magnify the cervix, vulva, and vagina if necessary. Your caregiver will put a mild solution of vinegar on the area. This solution makes abnormal cells more visible. The caregiver may also use a solution of weak iodine to help see any abnormal cells.  °The caregiver takes small pieces of tissue from suspicious areas. When using the colposcope, the technique is called directed cervical punch biopsy. A small amount of paste is placed on the biopsy site to prevent bleeding. You may feel mild discomfort during the biopsy. Your caregiver will record the location of the abnormal areas. Tissue samples will be sent to the lab to a specialist, to see if there are any abnormal cells. The specialist will examine your biopsy under a microscope.  °HOME CARE INSTRUCTIONS  °· Cramping usually goes away within minutes of the cervical biopsy. °· Lying down for a few minutes usually prevents light-headedness. °· Cramping may be treated with over-the-counter medicines. Only take over-the-counter or prescription medicines for pain or discomfort as directed by your caregiver. °· Your caregiver will discuss the cervical biopsy test results with you. Problems can range from   normal to mild or slightly abnormal changes in the cells, to cancer of the cervix. Treatments and follow-up depend upon what is found on the biopsy. °· You may have a small amount of minor bleeding from the vagina for 1 or 2 days. °· For a week (or as instructed) avoid: °¨ Sexual intercourse. °¨ Douches. °¨ Tampons. °· Complications may include: °¨ Heavier vaginal bleeding. °¨ Infection. °¨ Allergic reaction to the iodine used. °SEEK MEDICAL  CARE IF:  °· You develop a rash. °· You develop abnormal vaginal discharge. °· You develop itching or irritation of the vagina or vulva. °· You become dizzy or light-headed. °SEEK IMMEDIATE MEDICAL CARE IF:  °· You develop heavy vaginal bleeding. °· You develop a temperature of 100°F (37.9°C) or higher. °· You pass out. °· Any new problems develop. °Document Released: 04/24/2005 Document Revised: 09/08/2013 Document Reviewed: 03/01/2009 °ExitCare® Patient Information ©2015 [employer]. This information is not intended to replace advice given to you by your health care provider. Make sure you discuss any questions you have with your health care provider. ° °

## 2014-12-01 ENCOUNTER — Telehealth: Payer: Self-pay

## 2014-12-01 DIAGNOSIS — N889 Noninflammatory disorder of cervix uteri, unspecified: Secondary | ICD-10-CM | POA: Insufficient documentation

## 2014-12-01 LAB — WET PREP, GENITAL
Trich, Wet Prep: NONE SEEN
Yeast Wet Prep HPF POC: NONE SEEN

## 2014-12-01 NOTE — Telephone Encounter (Signed)
Called pt and was unable to LM due to message stating " call can not be completed at this time; try call again later." Re:  Pt needs to told results of BV and tx sent to her pharmacy.

## 2014-12-21 MED ORDER — METRONIDAZOLE 500 MG PO TABS: 500.0000 mg | ORAL_TABLET | Freq: Two times a day (BID) | ORAL | Status: DC

## 2014-12-21 NOTE — Telephone Encounter (Signed)
Attempted x 2 and unable to leave a message due to "call can not be completed at this time".

## 2015-02-25 ENCOUNTER — Emergency Department (HOSPITAL_COMMUNITY)
Admission: EM | Admit: 2015-02-25 | Discharge: 2015-02-25 | Disposition: A | Discharge status: Home / Self Care | Payer: Medicaid Other | Attending: Emergency Medicine | Admitting: Emergency Medicine

## 2015-02-25 ENCOUNTER — Encounter (HOSPITAL_COMMUNITY): Payer: Self-pay | Admitting: Neurology

## 2015-02-25 DIAGNOSIS — M5417 Radiculopathy, lumbosacral region: Secondary | ICD-10-CM | POA: Insufficient documentation

## 2015-02-25 DIAGNOSIS — F419 Anxiety disorder, unspecified: Secondary | ICD-10-CM | POA: Insufficient documentation

## 2015-02-25 DIAGNOSIS — K219 Gastro-esophageal reflux disease without esophagitis: Secondary | ICD-10-CM | POA: Insufficient documentation

## 2015-02-25 DIAGNOSIS — Z720 Tobacco use: Secondary | ICD-10-CM | POA: Insufficient documentation

## 2015-02-25 DIAGNOSIS — Z79899 Other long term (current) drug therapy: Secondary | ICD-10-CM | POA: Insufficient documentation

## 2015-02-25 DIAGNOSIS — M1990 Unspecified osteoarthritis, unspecified site: Secondary | ICD-10-CM | POA: Insufficient documentation

## 2015-02-25 MED ORDER — OXYCODONE-ACETAMINOPHEN 5-325 MG PO TABS: 1.0000 | ORAL_TABLET | ORAL | Status: DC | PRN

## 2015-02-25 MED ORDER — PREDNISONE 10 MG PO TABS: ORAL_TABLET | ORAL | Status: DC

## 2015-02-25 MED ORDER — DEXAMETHASONE SODIUM PHOSPHATE 10 MG/ML IJ SOLN
10.0000 mg | Freq: Once | INTRAMUSCULAR | Status: AC
  Administered 2015-02-25: 10 mg via INTRAMUSCULAR
  Filled 2015-02-25: qty 1

## 2015-02-25 MED ORDER — KETOROLAC TROMETHAMINE 60 MG/2ML IM SOLN
60.0000 mg | Freq: Once | INTRAMUSCULAR | Status: AC
  Administered 2015-02-25: 60 mg via INTRAMUSCULAR
  Filled 2015-02-25: qty 2

## 2015-02-25 MED ORDER — METHOCARBAMOL 500 MG PO TABS: 500.0000 mg | ORAL_TABLET | Freq: Two times a day (BID) | ORAL | Status: DC

## 2015-02-25 MED ORDER — NAPROXEN 500 MG PO TABS: 500.0000 mg | ORAL_TABLET | Freq: Two times a day (BID) | ORAL | Status: DC

## 2015-02-25 NOTE — Discharge Instructions (Signed)
Take naprosyn for pain. Percocet for severe pain. Prednisone until all gone starting tomorrow. Robaxin for spasms. Follow up with a community clinic or Dr. Jillyn HiddenBean. Return if worsening symptoms.    Lumbosacral Radiculopathy Lumbosacral radiculopathy is a condition that involves the spinal nerves and nerve roots in the low back and bottom of the spine. The condition develops when these nerves and nerve roots move out of place or become inflamed and cause symptoms. CAUSES This condition may be caused by:  Pressure from a disk that bulges out of place (herniated disk). A disk is a plate of cartilage that separates bones in the spine.  Disk degeneration.  A narrowing of the bones of the lower back (spinal stenosis).  A tumor.  An infection.  An injury that places sudden pressure on the disks that cushion the bones of your lower spine. RISK FACTORS This condition is more likely to develop in:  Males aged 30-50 years.  Females aged 50-60 years.  People who lift improperly.  People who are overweight or live a sedentary lifestyle.  People who smoke.  People who perform repetitive activities that strain the spine. SYMPTOMS Symptoms of this condition include:  Pain that goes down from the back into the legs (sciatica). This is the most common symptom. The pain may be worse with sitting, coughing, or sneezing.  Pain and numbness in the arms and legs.  Muscle weakness.  Tingling.  Loss of bladder control or bowel control. DIAGNOSIS This condition is diagnosed with a physical exam and medical history. If the pain is lasting, you may have tests, such as:  MRI scan.  X-ray.  CT scan.  Myelogram.  Nerve conduction study. TREATMENT This condition is often treated with:  Hot packs and ice applied to affected areas.  Stretches to improve flexibility.  Exercises to strengthen back muscles.  Physical therapy.  Pain medicine.  A steroid injection in the spine. In some  cases, no treatment is needed. If the condition is long-lasting (chronic), or if symptoms are severe, treatment may involve surgery or lifestyle changes, such as following a weight loss plan. HOME CARE INSTRUCTIONS Medicines  Take medicines only as directed by your health care provider.  Do not drive or operate heavy machinery while taking pain medicine. Injury Care  Apply a heat pack to the injured area as directed by your health care provider.  Apply ice to the affected area:  Put ice in a plastic bag.  Place a towel between your skin and the bag.  Leave the ice on for 20-30 minutes, every 2 hours while you are awake or as needed. Or, leave the ice on for as long as directed by your health care provider. Other Instructions  If you were shown how to do any exercises or stretches, do them as directed by your health care provider.  If your health care provider prescribed a diet or exercise program, follow it as directed.  Keep all follow-up visits as directed by your health care provider. This is important. SEEK MEDICAL CARE IF:  Your pain does not improve over time even when taking pain medicines. SEEK IMMEDIATE MEDICAL CARE IF:  Your develop severe pain.  Your pain suddenly gets worse.  You develop increasing weakness in your legs.  You lose the ability to control your bladder or bowel.  You have difficulty walking or balancing.  You have a fever.   This information is not intended to replace advice given to you by your health care provider.  Make sure you discuss any questions you have with your health care provider.   Document Released: 04/24/2005 Document Revised: 09/08/2014 Document Reviewed: 04/20/2014 Elsevier Interactive Patient Education [employer]

## 2015-02-25 NOTE — ED Provider Notes (Signed)
CSN: [id number]     Arrival date & time 02/25/15  1120 History  By signing my name below, I, Teresa Faulkner, attest that this documentation has been prepared under the direction and in the presence of Teresa Gannett, PA-C. Electronically Signed: [name], ED Scribe. 02/25/2015. 1:18 PM.  Chief Complaint  Patient presents with  . Back Pain   The history is provided by the patient. No language interpreter was used.   HPI Comments: Teresa Faulkner is a 49 y.o. female, with a PMhx of arthritis and back surgery, who presents to the Emergency Department complaining of intermittent, gradually worsening lower back pain that is worse to left para lumbar region and has been present for 1 month. Her back pain radiates posteriorly down BLE with new onset tingling in left leg. Denies any injury attributable to her pain. PShx by Leandra Kern, MD to her back s/p injury to right lower back 7 years ago. Pt states her current pain is similar to her back pain she experienced before surgery. She has been taking Aleve without significant relief. Pt is ambulatory. Denies bowel or bladder incontinence, urinary symptoms or weakness. She reports having a PCP but is hesitant to follow up with this provider due to lack of insurance. NKDA  Past Medical History  Diagnosis Date  . Arthritis   . GERD (gastroesophageal reflux disease)   . Bursitis   . Anxiety   . Migraine    Past Surgical History  Procedure Laterality Date  . Back surgery    . Tubal ligation     Family History  Problem Relation Age of Onset  . Diabetes Mother   . Diabetes Maternal Aunt   . Hypertension Maternal Aunt    Social History  Substance Use Topics  . Smoking status: Current Every Day Smoker -- 0.25 packs/day for 20 years    Types: Cigarettes  . Smokeless tobacco: Never Used  . Alcohol Use: No   OB History    Gravida Para Term Preterm AB TAB SAB Ectopic Multiple Living   0 0 0 0 0 0 2     Review of Systems   Genitourinary: Negative for dysuria, urgency and frequency.  Musculoskeletal: Positive for back pain. Negative for gait problem.  Neurological: Positive for numbness ( tingling LLE). Negative for weakness.   Allergies  Review of patient's allergies indicates no known allergies.  Home Medications   Prior to Admission medications   Medication Sig Start Date End Date Taking? Authorizing Provider  calcium carbonate (TUMS - DOSED IN MG ELEMENTAL CALCIUM) 500 MG chewable tablet Chew 1 tablet by mouth daily.    Historical Provider, MD  metroNIDAZOLE (FLAGYL) 500 MG tablet Take 1 tablet (500 mg total) by mouth 2 (two) times daily. 12/21/14   Levie Heritage, DO  omeprazole (PRILOSEC) 20 MG capsule Take 20 mg by mouth daily.    Historical Provider, MD   BP 129/71 mmHg  Pulse 77  Temp(Src) 99 F (37.2 C) (Oral)  Resp 20  SpO2 94% Physical Exam  Constitutional: She appears well-developed and well-nourished. No distress.  HENT:  Head: Normocephalic.  Eyes: Conjunctivae are normal.  Neck: Neck supple.  Cardiovascular: Normal rate, regular rhythm and normal heart sounds.   Pulmonary/Chest: Effort normal and breath sounds normal. No respiratory distress. She has no wheezes. She has no rales.  Abdominal: Soft. Bowel sounds are normal. She exhibits no distension. There is no tenderness. There is no rebound.  Musculoskeletal: She exhibits no  edema.  No midline lumbar spine tenderness. Pain with palpation bilateral SI joints. Pain with bilateral straight leg raise.   Neurological: She is alert.  5/5 and equal lower extremity strength. 2+ and equal patellar reflexes bilaterally. Pt able to dorsiflex bilateral toes and feet with good strength against resistance. Equal sensation bilaterally over thighs and lower legs.   Skin: Skin is warm and dry.  Psychiatric: She has a normal mood and affect. Her behavior is normal.  Nursing note and vitals reviewed.   ED Course  Procedures  DIAGNOSTIC  STUDIES: Oxygen Saturation is 94% on RA, adequate by my interpretation.    COORDINATION OF CARE: 1:29 PM Discussed treatment plan with pt at bedside and pt agreed to plan. Will order Toradol injection and prescribe a steroid course. Advised strict return precautions including incontinence, anuria, or unilateral weakness.    MDM   Final diagnoses:  Lumbosacral radiculopathy   Pt with worsening lower back pain. Hx of chronic pain but she states never this severe. Hx of back surgery. Pt with no neuro deficits on exam. No bladder or bowel problems. No fever. No iv drug use. Ambulatory. Plan to start on prednisone, percocet for severe pain,  Naprosyn, robaxin. Follow up with primary care doctor or Dr. Jillyn HiddenBean.   Filed Vitals:   02/25/15 1141 02/25/15 1357  BP: 129/71 120/67  Pulse: 77 63  Temp: 99 F (37.2 C) 98.5 F (36.9 C)  TempSrc: Oral Oral  Resp: 20 12  SpO2: 94% 97%    I personally performed the services described in this documentation, which was scribed in my presence. The recorded information has been reviewed and is accurate.   Jaynie Crumbleatyana Raeann Offner, PA-C 02/25/15 1821  Doug SouSam Jacubowitz, MD 02/26/15 [phone]

## 2015-02-25 NOTE — ED Notes (Signed)
Upon entering PT room Pt was talkin g on telephone to DR Lorita OfficerJ. Bean 's office for an appt.

## 2015-02-25 NOTE — ED Notes (Signed)
Declined W/C at D/C and was escorted to lobby by RN. 

## 2015-02-25 NOTE — ED Notes (Signed)
Pt reports lower back that is chronic but has been bad for 1 month. Is ambulatory.

## 2015-05-09 DIAGNOSIS — Z8619 Personal history of other infectious and parasitic diseases: Secondary | ICD-10-CM

## 2015-05-09 HISTORY — DX: Personal history of other infectious and parasitic diseases: Z86.19

## 2015-11-29 ENCOUNTER — Encounter: Payer: Self-pay | Admitting: Family Medicine

## 2016-02-24 ENCOUNTER — Ambulatory Visit (INDEPENDENT_AMBULATORY_CARE_PROVIDER_SITE_OTHER): Payer: Self-pay | Admitting: Physician Assistant

## 2016-03-02 ENCOUNTER — Encounter (INDEPENDENT_AMBULATORY_CARE_PROVIDER_SITE_OTHER): Payer: Self-pay | Admitting: Physician Assistant

## 2016-03-11 ENCOUNTER — Encounter (HOSPITAL_COMMUNITY): Payer: Self-pay | Admitting: Emergency Medicine

## 2016-03-11 ENCOUNTER — Ambulatory Visit (INDEPENDENT_AMBULATORY_CARE_PROVIDER_SITE_OTHER): Payer: BLUE CROSS/BLUE SHIELD

## 2016-03-11 ENCOUNTER — Ambulatory Visit (HOSPITAL_COMMUNITY)
Admission: EM | Admit: 2016-03-11 | Discharge: 2016-03-11 | Disposition: A | Discharge status: Home / Self Care | Payer: BLUE CROSS/BLUE SHIELD | Attending: Emergency Medicine | Admitting: Emergency Medicine

## 2016-03-11 DIAGNOSIS — S63502A Unspecified sprain of left wrist, initial encounter: Secondary | ICD-10-CM

## 2016-03-11 MED ORDER — DICLOFENAC SODIUM 3 % TD GEL: TRANSDERMAL | 0 refills | Status: DC

## 2016-03-11 NOTE — ED Triage Notes (Signed)
Pt reports she inj her left wrist 2-3 weeks while picking up a suitcase  Reports she heard a crack... Pain is getting worse and increases w/activity  Denies numbness/tingly of hand... Radial pulse 2 +   A&O x4... NAD

## 2016-03-11 NOTE — Discharge Instructions (Signed)
You have a wrist sprain. Wear the wrist brace for the next week, especially when you are at work. Apply diclofenac gel 4 times a day for the next week. Ice your wrist several times a day, especially after work. Follow-up as needed.

## 2016-03-11 NOTE — ED Provider Notes (Signed)
[facility]    CSN: [id number] Arrival date & time: 03/11/16  1548     History   Chief Complaint Chief Complaint  Patient presents with  . Hand Injury    HPI Teresa Faulkner is a 50 y.o. female.   HPI 50 y.o. female presents to UC c/o left wrist pain x 2-3 weeks after slinging empty suitcase onto her bed with a dorsiflexed movement.  Heard popping/cracking noise and felt immediate pain.  Pain is localized to dorsal side of wrist over ulnar bone and also tenderness over 5th metatarsal on dorsal side. Pain occurs with wrist movement, especially with pronation of forearm, and is unable to hold even a 16 oz bottle of water in that hand. Reports no improvement since injury and perhaps getting worse.  Reports mild swelling. Has been using ace bandage for support. Takes Aleve as needed for pain with moderate relief.   Hx arthritis in back.Denies any previous injury to surgery to this area. Denies any other injury with this incident.  Past Medical History:  Diagnosis Date  . Anxiety   . Arthritis   . Bursitis   . GERD (gastroesophageal reflux disease)   . Migraine     Patient Active Problem List   Diagnosis Date Noted  . Cervical lesion 12/01/2014    Past Surgical History:  Procedure Laterality Date  . BACK SURGERY    . TUBAL LIGATION      OB History    Gravida Para Term Preterm AB Living   2 2 2  0 0 2   SAB TAB Ectopic Multiple Live Births   0 0 0 0         Home Medications    Prior to Admission medications   Medication Sig Start Date End Date Taking? Authorizing Provider  esomeprazole (NEXIUM) 20 MG capsule Take 20 mg by mouth daily at 12 noon.   Yes Historical Provider, MD  calcium carbonate (TUMS - DOSED IN MG ELEMENTAL CALCIUM) 500 MG chewable tablet Chew 1 tablet by mouth daily.    Historical Provider, MD  Diclofenac Sodium 3 % GEL Apply to affected area 4 times a day 03/11/16   Charm RingsErin J Caramia Boutin, MD  naproxen (NAPROSYN) 500 MG tablet Take 1 tablet (500  mg total) by mouth 2 (two) times daily. 02/25/15   Jaynie Crumbleatyana Kirichenko, PA-C    Family History Family History  Problem Relation Age of Onset  . Diabetes Mother   . Diabetes Maternal Aunt   . Hypertension Maternal Aunt     Social History Social History  Substance Use Topics  . Smoking status: Current Every Day Smoker    Packs/day: 0.25    Years: 20.00    Types: Cigarettes  . Smokeless tobacco: Never Used  . Alcohol use No     Allergies   Review of patient's allergies indicates no known allergies.   Review of Systems Review of Systems  See HPI   Physical Exam Triage Vital Signs ED Triage Vitals  Enc Vitals Group     BP 03/11/16 1745 115/71     Pulse Rate 03/11/16 1745 75     Resp 03/11/16 1745 16     Temp 03/11/16 1745 97.9 F (36.6 C)     Temp Source 03/11/16 1745 Oral     SpO2 03/11/16 1745 99 %     Weight --      Height --      Head Circumference --      Peak Flow --  Pain Score 03/11/16 1747 8     Pain Loc --      Pain Edu? --      Excl. in GC? --    No data found.   Updated Vital Signs BP 115/71 (BP Location: Left Arm)   Pulse 75   Temp 97.9 F (36.6 C) (Oral)   Resp 16   SpO2 99%    Physical Exam  Constitutional: She is oriented to person, place, and time. She appears well-developed and well-nourished.  HENT:  Head: Normocephalic and atraumatic.  Eyes: EOM are normal.  Neck: Normal range of motion.  Cardiovascular: Normal rate.   Pulmonary/Chest: Effort normal. No respiratory distress.  Musculoskeletal:       Left wrist: She exhibits tenderness (on dorsal aspect of wrist on ulnar bone and proximal dorsal aspect of 5th metatarsal). She exhibits normal range of motion (although pain is present, especially with pronation and supination of forearm), no swelling, no crepitus, no deformity and no laceration.  5/5 strength in left arm, wrist, and fingers.  Pain with radial deviation.  Neurological: She is alert and oriented to person, place,  and time.  Skin: Skin is warm and dry. Capillary refill takes less than 2 seconds. No rash noted. No erythema.  Psychiatric: She has a normal mood and affect. Her behavior is normal.  Nursing note and vitals reviewed.    UC Treatments / Results  Labs (all labs ordered are listed, but only abnormal results are displayed) Labs Reviewed - No data to display  EKG  EKG Interpretation None       Radiology Dg Wrist Complete Left  Result Date: 03/11/2016 CLINICAL DATA:  50 year old female with a history of wrist injury EXAM: LEFT WRIST - COMPLETE 3+ VIEW COMPARISON:  None. FINDINGS: No displaced fracture. No focal soft tissue swelling. No significant degenerative changes. No radiopaque foreign body. Carpal bones maintain alignment. IMPRESSION: Negative for acute bony abnormality. Signed, Yvone NeuJaime S. Loreta AveWagner, DO [facility] Electronically Signed   By: Gilmer MorJaime  Wagner D.O.   On: 03/11/2016 18:44    Procedures Procedures (including critical care time)  Medications Ordered in UC Medications - No data to display   Initial Impression / Assessment and Plan / UC Course  I have reviewed the triage vital signs and the nursing notes.  Pertinent labs & imaging results that were available during my care of the patient were reviewed by me and considered in my medical decision making (see chart for details).  Clinical Course  50 y.o. female presents to UC c/o left medial wrist pain x 2-3 weeks after hearing a pop/crack while slinging suit case onto bed.  Complete left wrist x-ray: Unremarkable.  Likely a combination of bruising and strain.  Wrist splint applied at today's visit. Can remove to shower or for ROM exercises.  Diclofenac gel QID.   Final Clinical Impressions(s) / UC Diagnoses   Final diagnoses:  Left wrist sprain, initial encounter    New Prescriptions Discharge Medication List as of 03/11/2016  6:58 PM    START taking  these medications   Details  Diclofenac Sodium 3 % GEL Apply to affected area 4 times a day, Print         Charm RingsErin J Barak Bialecki, MD 03/11/16 2042

## 2016-03-13 ENCOUNTER — Ambulatory Visit (INDEPENDENT_AMBULATORY_CARE_PROVIDER_SITE_OTHER): Payer: Self-pay

## 2016-03-13 ENCOUNTER — Ambulatory Visit (INDEPENDENT_AMBULATORY_CARE_PROVIDER_SITE_OTHER): Payer: BLUE CROSS/BLUE SHIELD | Admitting: Physician Assistant

## 2016-03-13 DIAGNOSIS — M545 Low back pain, unspecified: Secondary | ICD-10-CM

## 2016-03-13 DIAGNOSIS — G8929 Other chronic pain: Secondary | ICD-10-CM | POA: Diagnosis not present

## 2016-03-13 DIAGNOSIS — M25561 Pain in right knee: Secondary | ICD-10-CM

## 2016-03-13 MED ORDER — METHYLPREDNISOLONE 4 MG PO TABS: ORAL_TABLET | ORAL | 0 refills | Status: DC

## 2016-03-13 MED ORDER — METHOCARBAMOL 500 MG PO TABS: 500.0000 mg | ORAL_TABLET | Freq: Every evening | ORAL | 1 refills | Status: DC | PRN

## 2016-03-13 NOTE — Progress Notes (Signed)
Office Visit Note   Patient: Teresa Faulkner           Date of Birth: 11/04/1965           MRN: [id number] Visit Date: 03/13/2016              Requested by: Fleet ContrasEdwin Avbuere, MD [address] [place], [place] [id number] PCP: Dorrene GermanEdwin A Avbuere, MD   Assessment & Plan: Visit Diagnoses:  1. Acute midline low back pain without sciatica   2. Chronic pain of right knee     Plan: Physical therapy course strengthening hamstring IT band stretching, and modalities. Quad strengthening. Discussed with patient weight loss. Medrol Dosepak muscle relaxant. Patient does not tolerate NSAIDs well. Moist deep back. Off-the-shelf open patella brace. At follow-up if her back pain persists may consider MRI to rule out HNP. If knee pain persists would recommend steroid injection  Follow-Up Instructions: Return in about 4 weeks (around 04/10/2016).   Orders:  Orders Placed This Encounter  Procedures  . XR Lumbar Spine 2-3 Views  . XR Knee 1-2 Views Right  . Ambulatory referral to Physical Therapy   Meds ordered this encounter  Medications  . methylPREDNISolone (MEDROL) 4 MG tablet    Sig: Take as directed. Over 6 days.    Dispense:  21 tablet    Refill:  0  . methocarbamol (ROBAXIN) 500 MG tablet    Sig: Take 1 tablet (500 mg total) by mouth at bedtime as needed for muscle spasms.    Dispense:  40 tablet    Refill:  1      Procedures: No procedures performed   Clinical Data: No additional findings.   Subjective: Chief Complaint  Patient presents with  . Lower Back - Pain  . Right Knee - Pain    Patient comes in today with back pain and right knee pain. She denies any radiculopathy. She states she has had knee pain since she was little. She states a doctor told her once her "patella was shifted". She also states she has a history of back surgery. [facility] . Back pain for the last year no new injury.   Right knee gives way at least weekly, or other mechanical  symptoms. She's tried a knee brace in the past without any real relief. Does note knee swells at times. She's had no treatment for the right knee recently.    Review of Systems   Objective: Vital Signs: There were no vitals taken for this visit.  Physical Exam  Constitutional: She is oriented to person, place, and time. She appears well-nourished.  HENT:  Head: Normocephalic and atraumatic.  Pulmonary/Chest: Effort normal.  Musculoskeletal:       Right knee: She exhibits no effusion.       Left knee: She exhibits no effusion.  Neurological: She is alert and oriented to person, place, and time.  Skin: Skin is warm and dry.  Psychiatric: She has a normal mood and affect.    Right Knee Exam   Tenderness  The patient is experiencing tenderness in the lateral joint line.  Range of Motion  Extension: 0  Flexion: 110   Tests  Varus: negative Valgus: negative  Other  Erythema: absent Pulse: present Swelling: none Other tests: no effusion present  Comments:  Significant crepitus peripatellar region with passive range of motion of the right knee   Left Knee Exam  Left knee exam is normal.  Tenderness  The patient is experiencing  no tenderness.     Range of Motion  Extension: 0  Flexion: 110   Tests  Varus: negative Valgus: negative  Other  Erythema: absent Pulse: present Swelling: none Effusion: no effusion present   Back Exam   Tenderness  The patient is experiencing tenderness in the lumbar.  Range of Motion  Extension: normal  Flexion: normal   Muscle Strength  Right Quadriceps:  5/5  Left Quadriceps:  5/5  Right Hamstrings:  5/5  Left Hamstrings:  5/5   Tests  Straight leg raise right: negative Straight leg raise left: negative  Reflexes  Patellar: normal Achilles: normal Babinski's sign: normal   Other  Sensation: normal  Comments:  Tenderness of the left hip trochanteric region. Good range of motion of both hips without  pain      Specialty Comments:  No specialty comments available.  Imaging: No results found.   PMFS History: Patient Active Problem List   Diagnosis Date Noted  . Cervical lesion 12/01/2014   Past Medical History:  Diagnosis Date  . Anxiety   . Arthritis   . Bursitis   . GERD (gastroesophageal reflux disease)   . Migraine     Family History  Problem Relation Age of Onset  . Diabetes Mother   . Diabetes Maternal Aunt   . Hypertension Maternal Aunt     Past Surgical History:  Procedure Laterality Date  . BACK SURGERY    . TUBAL LIGATION     Social History   Occupational History  . Not on file.   Social History Main Topics  . Smoking status: Current Every Day Smoker    Packs/day: 0.25    Years: 20.00    Types: Cigarettes  . Smokeless tobacco: Never Used  . Alcohol use No  . Drug use: No  . Sexual activity: Not Currently    Birth control/ protection: Surgical

## 2016-03-23 ENCOUNTER — Ambulatory Visit: Payer: BLUE CROSS/BLUE SHIELD | Attending: Physician Assistant

## 2016-03-23 DIAGNOSIS — M6281 Muscle weakness (generalized): Secondary | ICD-10-CM | POA: Insufficient documentation

## 2016-03-23 DIAGNOSIS — G8929 Other chronic pain: Secondary | ICD-10-CM

## 2016-03-23 DIAGNOSIS — M2560 Stiffness of unspecified joint, not elsewhere classified: Secondary | ICD-10-CM | POA: Insufficient documentation

## 2016-03-23 DIAGNOSIS — M545 Low back pain, unspecified: Secondary | ICD-10-CM

## 2016-03-23 DIAGNOSIS — M25651 Stiffness of right hip, not elsewhere classified: Secondary | ICD-10-CM

## 2016-03-23 DIAGNOSIS — M25561 Pain in right knee: Secondary | ICD-10-CM | POA: Insufficient documentation

## 2016-03-23 NOTE — Patient Instructions (Signed)
Suggested heating pad, gentle knee to chest , and quad sets for home and we would start HEP next visit

## 2016-03-23 NOTE — Therapy (Addendum)
[place], [place], [address] Phone: [phone]   Fax:  [phone]  Physical Therapy Evaluation/Discharge  Patient Details  Name: Teresa Faulkner MRN: [id number] Date of Birth: 1965/09/04 Referring Provider: Benita Stabile, PA  Encounter Date: 03/23/2016      PT End of Session - 03/23/16 1321    Visit Number 1   Number of Visits 12   Date for PT Re-Evaluation 05/05/16   Authorization Type [employer]   PT Start Time 7342   PT Stop Time 1330   PT Time Calculation (min) 55 min   Activity Tolerance Patient tolerated treatment well   Behavior During Therapy [facility] for tasks assessed/performed      Past Medical History:  Diagnosis Date  . Anxiety   . Arthritis   . Bursitis   . GERD (gastroesophageal reflux disease)   . Migraine     Past Surgical History:  Procedure Laterality Date  . BACK SURGERY    . TUBAL LIGATION      There were no vitals filed for this visit.       Subjective Assessment - 03/23/16 1242    Subjective She reports LBP and RT knee pain. No MRI until try PT .   Pain was 5-6 /10 prior to worsening pain in back .  Knee pain since chilhood.   In past 6 months she reports doing more walking and stairs.   Meds don't help   Pertinent History Lumbar surgery possble laminectomy L4-5   Limitations --  stairs,    How long can you sit comfortably? 60 min   How long can you stand comfortably? 30 min max   How long can you walk comfortably? 2 hours   Patient Stated Goals Did not want to come but MD made me    Currently in Pain? Yes   Pain Score 8    Pain Location Back   Pain Orientation Right;Left;Posterior   Pain Descriptors / Indicators Burning   Pain Type Chronic pain   Pain Onset More than a month ago   Pain Frequency Constant   Aggravating Factors  everything   Pain Relieving Factors lying flat supine , sit briefly,     Multiple Pain Sites Yes   Pain Score 8   Pain Location Knee    Pain Orientation Right;Anterior   Pain Descriptors / Indicators Aching;Burning   Pain Type Chronic pain   Pain Onset More than a month ago   Pain Frequency Constant   Aggravating Factors  walking , stairs   Pain Relieving Factors rest/ sitting            [facility] PT Assessment - 03/23/16 0001      Assessment   Medical Diagnosis midline LBP RT knee pain   Referring Provider Benita Stabile, PA   Onset Date/Surgical Date --  Back pain 6 months, Lumbar surgery 2008.  Continued back pai   Next MD Visit as needed   Prior Therapy Yes post lumbar surgery without significant benefit     Precautions   Precaution Comments Dr Tonita Cong placed her on    lifting max 15 pounds, limit bending /squatting /lifting     Restrictions   Weight Bearing Restrictions No     Balance Screen   Has the patient fallen in the past 6 months No   Has the patient had a decrease in activity level because of a fear of falling?  No   Is the patient reluctant to  leave their home because of a fear of falling?  No     Prior Function   Level of Independence Independent   Vocation Full time employment     Cognition   Overall Cognitive Status Within Functional Limits for tasks assessed     Observation/Other Assessments   Focus on Therapeutic Outcomes (FOTO)  62% limited     Posture/Postural Control   Posture Comments WNL in back      ROM / Strength   AROM / PROM / Strength AROM;Strength     AROM   Overall AROM Comments Passive Hip IR Lt 40 degrees RT 25 degrees    AROM Assessment Site Lumbar;Knee   Right/Left Knee Right;Left   Right Knee Extension 180   Left Knee Extension 180   Left Knee Flexion 125  prone   Lumbar Flexion 80   Lumbar Extension 20   Lumbar - Right Side Bend 25   Lumbar - Left Side Bend 25   Lumbar - Right Rotation 45   Lumbar - Left Rotation 32  LE knees in supine to LT     Strength   Overall Strength Comments Normal LE atrength except hip extension  3-/5 and abduction  and       Palpation   Patella mobility WNL   Spinal mobility Stiffness in lower bacl    Palpation comment Tender lumbar spine most in lower lumbar     Ambulation/Gait   Gait Comments WNL                   [facility] Adult PT Treatment/Exercise - 03/23/16 0001      Lumbar Exercises: Stretches   Single Knee to Chest Stretch 3 reps;30 seconds  RT and LT   Pelvic Tilt --  10 reps 5 sec     Lumbar Exercises: Quadruped   Madcat/Old Horse 10 reps  2 sets     Modalities   Modalities Moist Heat;Electrical Stimulation     Moist Heat Therapy   Number Minutes Moist Heat 12 Minutes   Moist Heat Location Lumbar Spine;Knee  LT knee                PT Education - 03/23/16 1321    Education provided Yes   Education Details POC, HEP ,    Person(s) Educated Patient   Methods Explanation   Comprehension Verbalized understanding          PT Short Term Goals - 03/23/16 1324      PT SHORT TERM GOAL #1   Title Independent with inital HEP   Time 3   Period Weeks   Status New     PT SHORT TERM GOAL #2   Title back pain decreased 25% or more    Time 3   Period Weeks   Status New     PT SHORT TERM GOAL #3   Title hip strength extesnion improved to 3/5 or better   Time 3   Period Weeks   Status New           PT Long Term Goals - 03/23/16 1325      PT LONG TERM GOAL #1   Title She will be independent with all HEP issued   Time 6   Period Weeks   Status New     PT LONG TERM GOAL #2   Title she will demo understanding of good supported posture   Time 6   Period Weeks   Status New  PT LONG TERM GOAL #3   Title She will report pain decreased to 5-6/10 max   Time 6   Period Weeks   Status Revised     PT LONG TERM GOAL #4   Title She will report RT knee pain decreased 50% or more with normal walking activity   Time 6   Period Weeks   Status New     PT LONG TERM GOAL #5   Title She will report able to stand for 30 min without increased pain   Time 6    Period Weeks   Status New               Plan - 03/23/16 1321    Clinical Impression Statement Ms Moren presents for moderate complexity eval of chronic lower back pain and RT knee pain with hip weakness/ , back spasm ,stiffness of spine and RT hip   Rehab Potential Good   PT Frequency 2x / week   PT Duration 6 weeks   PT Treatment/Interventions Cryotherapy;Electrical Stimulation;Iontophoresis 96m/ml Dexamethasone;Moist Heat;Traction;Ultrasound;Passive range of motion;Patient/family education;Taping;Manual techniques;Therapeutic exercise;Dry needling   PT Next Visit Plan Modalities, Manual, HEP    Consulted and Agree with Plan of Care Patient      Patient will benefit from skilled therapeutic intervention in order to improve the following deficits and impairments:  Pain, Decreased strength, Decreased activity tolerance, Increased muscle spasms, Difficulty walking, Decreased range of motion  Visit Diagnosis: Chronic bilateral low back pain without sciatica - Plan: PT plan of care cert/re-cert  Chronic pain of right knee - Plan: PT plan of care cert/re-cert  Muscle weakness (generalized) - Plan: PT plan of care cert/re-cert  Joint stiffness of spine - Plan: PT plan of care cert/re-cert  Stiffness of right hip, not elsewhere classified - Plan: PT plan of care cert/re-cert     Problem List Patient Active Problem List   Diagnosis Date Noted  . Cervical lesion 12/01/2014    CDarrel Hoover PT 03/23/2016, 1:35 PM  [facility] [address]Phone: [phone]  Fax:  [phone] Name: Teresa COLESONMRN: [id number] of Birth: [birth year] PHYSICAL THERAPY DISCHARGE SUMMARY  Visits from Start of Care: Eval only  Current functional level related to goals / functional outcomes: See above   Remaining deficits: Unknown   Education / Equipment: NA  Plan: Patient agrees to  discharge.  Patient goals were not met. Patient is being discharged due to financial reasons.  ?????    SLillette BoxerChasse PT    05/09/16                10:49 AM

## 2016-04-03 ENCOUNTER — Ambulatory Visit: Payer: BLUE CROSS/BLUE SHIELD

## 2016-04-04 ENCOUNTER — Other Ambulatory Visit: Payer: Self-pay | Admitting: Surgery

## 2016-04-04 DIAGNOSIS — E049 Nontoxic goiter, unspecified: Secondary | ICD-10-CM

## 2016-04-05 ENCOUNTER — Encounter (HOSPITAL_COMMUNITY): Payer: Self-pay | Admitting: Emergency Medicine

## 2016-04-05 ENCOUNTER — Ambulatory Visit (HOSPITAL_COMMUNITY)
Admission: EM | Admit: 2016-04-05 | Discharge: 2016-04-05 | Disposition: A | Discharge status: Home / Self Care | Payer: BLUE CROSS/BLUE SHIELD | Attending: Family Medicine | Admitting: Family Medicine

## 2016-04-05 DIAGNOSIS — R69 Illness, unspecified: Secondary | ICD-10-CM

## 2016-04-05 DIAGNOSIS — J111 Influenza due to unidentified influenza virus with other respiratory manifestations: Secondary | ICD-10-CM

## 2016-04-05 MED ORDER — ONDANSETRON 4 MG PO TBDP
4.0000 mg | ORAL_TABLET | Freq: Once | ORAL | Status: AC
  Administered 2016-04-05: 4 mg via ORAL

## 2016-04-05 MED ORDER — ONDANSETRON 4 MG PO TBDP
ORAL_TABLET | ORAL | Status: AC
  Filled 2016-04-05: qty 1

## 2016-04-05 MED ORDER — ACETAMINOPHEN 325 MG PO TABS
ORAL_TABLET | ORAL | Status: AC
  Filled 2016-04-05: qty 3

## 2016-04-05 MED ORDER — ONDANSETRON HCL 4 MG PO TABS: 4.0000 mg | ORAL_TABLET | Freq: Four times a day (QID) | ORAL | 0 refills | Status: DC

## 2016-04-05 MED ORDER — ACETAMINOPHEN 500 MG PO TABS
1000.0000 mg | ORAL_TABLET | Freq: Once | ORAL | Status: AC
  Administered 2016-04-05: 1000 mg via ORAL

## 2016-04-05 NOTE — ED Triage Notes (Signed)
Cough, fever, generalized feeling bad.  Onset 3 days ago.  Patient has a fever in the department.  Soreness around torso

## 2016-04-05 NOTE — Discharge Instructions (Signed)
Drink plenty of fluids as discussed, use medicine as prescribed as needed and mucinex or delsym for cough. Return or see your doctor if further problems

## 2016-04-05 NOTE — ED Provider Notes (Signed)
[facility]    CSN: [id number] Arrival date & time: 04/05/16  1611     History   Chief Complaint Chief Complaint  Patient presents with  . URI    HPI Teresa Faulkner is a 50 y.o. female.   The history is provided by the patient.  URI  Presenting symptoms: congestion and fever   Presenting symptoms: no cough   Severity:  Moderate Onset quality:  Sudden Duration:  2 days Progression:  Worsening Chronicity:  New Associated symptoms: myalgias   Risk factors: sick contacts     Past Medical History:  Diagnosis Date  . Anxiety   . Arthritis   . Bursitis   . GERD (gastroesophageal reflux disease)   . Migraine     Patient Active Problem List   Diagnosis Date Noted  . Cervical lesion 12/01/2014    Past Surgical History:  Procedure Laterality Date  . BACK SURGERY    . TUBAL LIGATION      OB History    Gravida Para Term Preterm AB Living   2 2 2  0 0 2   SAB TAB Ectopic Multiple Live Births   0 0 0 0         Home Medications    Prior to Admission medications   Medication Sig Start Date End Date Taking? Authorizing Provider  acetaminophen (TYLENOL) 325 MG tablet Take 650 mg by mouth every 6 (six) hours as needed.   Yes Historical Provider, MD  calcium carbonate (TUMS - DOSED IN MG ELEMENTAL CALCIUM) 500 MG chewable tablet Chew 1 tablet by mouth daily.    Historical Provider, MD  Diclofenac Sodium 3 % GEL Apply to affected area 4 times a day Patient not taking: Reported on 03/23/2016 03/11/16   Charm RingsErin J Honig, MD  esomeprazole (NEXIUM) 20 MG capsule Take 20 mg by mouth daily at 12 noon.    Historical Provider, MD  methocarbamol (ROBAXIN) 500 MG tablet Take 1 tablet (500 mg total) by mouth at bedtime as needed for muscle spasms. 03/13/16   Kirtland BouchardGilbert W Clark, PA-C  methylPREDNISolone (MEDROL) 4 MG tablet Take as directed. Over 6 days. Patient not taking: Reported on 03/23/2016 03/13/16   Kirtland BouchardGilbert W Clark, PA-C  naproxen (NAPROSYN) 500 MG tablet Take 1  tablet (500 mg total) by mouth 2 (two) times daily. Patient not taking: Reported on 03/23/2016 02/25/15   Jaynie Crumbleatyana Kirichenko, PA-C    Family History Family History  Problem Relation Age of Onset  . Diabetes Mother   . Diabetes Maternal Aunt   . Hypertension Maternal Aunt     Social History Social History  Substance Use Topics  . Smoking status: Current Every Day Smoker    Packs/day: 0.25    Years: 20.00    Types: Cigarettes  . Smokeless tobacco: Never Used  . Alcohol use No     Allergies   Patient has no known allergies.   Review of Systems Review of Systems  Constitutional: Positive for fever.  HENT: Positive for congestion.   Respiratory: Negative.  Negative for cough and shortness of breath.   Cardiovascular: Negative.   Musculoskeletal: Positive for myalgias.  All other systems reviewed and are negative.    Physical Exam Triage Vital Signs ED Triage Vitals  Enc Vitals Group     BP 04/05/16 1806 149/78     Pulse Rate 04/05/16 1806 98     Resp 04/05/16 1806 24     Temp 04/05/16 1806 102.1 F (38.9 C)  Temp Source 04/05/16 1806 Oral     SpO2 04/05/16 1806 97 %     Weight --      Height --      Head Circumference --      Peak Flow --      Pain Score 04/05/16 1805 8     Pain Loc --      Pain Edu? --      Excl. in GC? --    No data found.   Updated Vital Signs BP 149/78 (BP Location: Right Arm)   Pulse 98   Temp 102.1 F (38.9 C) (Oral)   Resp 24   SpO2 97%   Visual Acuity Right Eye Distance:   Left Eye Distance:   Bilateral Distance:    Right Eye Near:   Left Eye Near:    Bilateral Near:     Physical Exam  Constitutional: She appears well-developed and well-nourished. She appears distressed.  HENT:  Right Ear: External ear normal.  Left Ear: External ear normal.  Nose: Nose normal.  Mouth/Throat: Oropharynx is clear and moist.  Eyes: Pupils are equal, round, and reactive to light.  Neck: Normal range of motion. Neck supple.    Cardiovascular: Normal rate, regular rhythm, normal heart sounds and intact distal pulses.   Pulmonary/Chest: Effort normal and breath sounds normal. She exhibits tenderness.  Lymphadenopathy:    She has no cervical adenopathy.  Skin: Skin is warm and dry.  Nursing note and vitals reviewed.    UC Treatments / Results  Labs (all labs ordered are listed, but only abnormal results are displayed) Labs Reviewed - No data to display  EKG  EKG Interpretation None       Radiology No results found.  Procedures Procedures (including critical care time)  Medications Ordered in UC Medications - No data to display   Initial Impression / Assessment and Plan / UC Course  I have reviewed the triage vital signs and the nursing notes.  Pertinent labs & imaging results that were available during my care of the patient were reviewed by me and considered in my medical decision making (see chart for details).  Clinical Course       Final Clinical Impressions(s) / UC Diagnoses   Final diagnoses:  None    New Prescriptions New Prescriptions   No medications on file     Linna HoffJames D Saman Umstead, MD 04/05/16 [phone]

## 2016-04-06 ENCOUNTER — Ambulatory Visit: Payer: Medicaid Other | Admitting: Family Medicine

## 2016-04-06 ENCOUNTER — Encounter: Payer: Self-pay | Admitting: Family Medicine

## 2016-04-07 ENCOUNTER — Encounter: Payer: Self-pay | Admitting: Family Medicine

## 2016-04-10 ENCOUNTER — Ambulatory Visit (INDEPENDENT_AMBULATORY_CARE_PROVIDER_SITE_OTHER): Payer: BLUE CROSS/BLUE SHIELD | Admitting: Physician Assistant

## 2016-04-12 ENCOUNTER — Encounter: Payer: Self-pay | Admitting: Family Medicine

## 2016-04-13 ENCOUNTER — Ambulatory Visit: Payer: BLUE CROSS/BLUE SHIELD

## 2016-04-17 ENCOUNTER — Ambulatory Visit: Payer: BLUE CROSS/BLUE SHIELD

## 2016-05-11 ENCOUNTER — Ambulatory Visit: Payer: Medicaid Other | Admitting: Family Medicine

## 2016-05-22 ENCOUNTER — Ambulatory Visit (INDEPENDENT_AMBULATORY_CARE_PROVIDER_SITE_OTHER): Payer: BLUE CROSS/BLUE SHIELD | Admitting: Family Medicine

## 2016-05-22 ENCOUNTER — Ambulatory Visit (INDEPENDENT_AMBULATORY_CARE_PROVIDER_SITE_OTHER): Payer: Self-pay | Admitting: Clinical

## 2016-05-22 ENCOUNTER — Encounter: Payer: Self-pay | Admitting: Family Medicine

## 2016-05-22 DIAGNOSIS — F4322 Adjustment disorder with anxiety: Secondary | ICD-10-CM

## 2016-05-22 DIAGNOSIS — Z111 Encounter for screening for respiratory tuberculosis: Secondary | ICD-10-CM

## 2016-05-22 DIAGNOSIS — R1013 Epigastric pain: Secondary | ICD-10-CM

## 2016-05-22 DIAGNOSIS — R109 Unspecified abdominal pain: Secondary | ICD-10-CM | POA: Insufficient documentation

## 2016-05-22 DIAGNOSIS — K449 Diaphragmatic hernia without obstruction or gangrene: Secondary | ICD-10-CM | POA: Insufficient documentation

## 2016-05-22 DIAGNOSIS — R3589 Other polyuria: Secondary | ICD-10-CM

## 2016-05-22 DIAGNOSIS — Z780 Asymptomatic menopausal state: Secondary | ICD-10-CM | POA: Insufficient documentation

## 2016-05-22 DIAGNOSIS — Z23 Encounter for immunization: Secondary | ICD-10-CM | POA: Diagnosis not present

## 2016-05-22 DIAGNOSIS — Z1231 Encounter for screening mammogram for malignant neoplasm of breast: Secondary | ICD-10-CM

## 2016-05-22 DIAGNOSIS — Z124 Encounter for screening for malignant neoplasm of cervix: Secondary | ICD-10-CM

## 2016-05-22 DIAGNOSIS — G44229 Chronic tension-type headache, not intractable: Secondary | ICD-10-CM

## 2016-05-22 DIAGNOSIS — R358 Other polyuria: Secondary | ICD-10-CM

## 2016-05-22 DIAGNOSIS — Z01419 Encounter for gynecological examination (general) (routine) without abnormal findings: Secondary | ICD-10-CM | POA: Diagnosis not present

## 2016-05-22 DIAGNOSIS — Z0000 Encounter for general adult medical examination without abnormal findings: Secondary | ICD-10-CM | POA: Diagnosis not present

## 2016-05-22 DIAGNOSIS — R51 Headache: Secondary | ICD-10-CM

## 2016-05-22 DIAGNOSIS — R519 Headache, unspecified: Secondary | ICD-10-CM | POA: Insufficient documentation

## 2016-05-22 DIAGNOSIS — Z716 Tobacco abuse counseling: Secondary | ICD-10-CM

## 2016-05-22 LAB — HIV ANTIBODY (ROUTINE TESTING W REFLEX): HIV: NONREACTIVE

## 2016-05-22 LAB — POCT URINALYSIS DIP (DEVICE)
Bilirubin Urine: NEGATIVE
Glucose, UA: NEGATIVE mg/dL
Ketones, ur: NEGATIVE mg/dL
Leukocytes, UA: NEGATIVE
Nitrite: NEGATIVE
PH: 5.5 (ref 5.0–8.0)
Protein, ur: NEGATIVE mg/dL
Specific Gravity, Urine: 1.02 (ref 1.005–1.030)
UROBILINOGEN UA: 0.2 mg/dL (ref 0.0–1.0)

## 2016-05-22 LAB — HEPATITIS C ANTIBODY: HCV Ab: NEGATIVE

## 2016-05-22 LAB — HEPATITIS B SURFACE ANTIGEN: HEP B S AG: NEGATIVE

## 2016-05-22 MED ORDER — TUBERCULIN PPD 5 UNIT/0.1ML ID SOLN
5.0000 [IU] | Freq: Once | INTRADERMAL | Status: AC
  Administered 2016-05-22: 5 [IU] via INTRADERMAL

## 2016-05-22 NOTE — Assessment & Plan Note (Signed)
Has appointment to see gen surg--consider GI referral 

## 2016-05-22 NOTE — Patient Instructions (Addendum)
Advance Directive Advance directives are the legal documents that allow you to make choices about your health care and medical treatment if you cannot speak for yourself. Advance directives are a way for you to communicate your wishes to family, friends, and health care providers. The specified people can then convey your decisions about end-of-life care to avoid confusion if you should become unable to communicate. Ideally, the process of discussing and writing advance directives should happen over time rather than making decisions all at once. Advance directives can be modified as your situation changes, and you can change your mind at any time, even after you have signed the advance directives. Each state has its own laws regarding advance directives. You may want to check with your health care provider, attorney, or state representative about the law in your state. Below are some examples of advance directives. HEALTH CARE PROXY AND DURABLE POWER OF ATTORNEY FOR HEALTH CARE A health care proxy is a person (agent) appointed to make medical decisions for you if you cannot. Generally, people choose someone they know well and trust to represent their preferences when they can no longer do so. You should be sure to ask this person for agreement to act as your agent. An agent may have to exercise judgment in the event of a medical decision for which your wishes are not known. A durable power of attorney for health care is a legal document that names your health care proxy. Depending on the laws in your state, after the document is written, it may also need to be:  Signed.  Notarized.  Dated.  Copied.  Witnessed.  Incorporated into your medical record. You may also want to appoint someone to manage your financial affairs if you cannot. This is called a durable power of attorney for finances. It is a separate legal document from the durable power of attorney for health care. You may choose the same  person or someone different from your health care proxy to act as your agent in financial matters. LIVING WILL A living will is a set of instructions documenting your wishes about medical care when you cannot care for yourself. It is used if you become:  Terminally ill.  Incapacitated.  Unable to communicate.  Unable to make decisions. Items to consider in your living will include:  The use or non-use of life-sustaining equipment, such as dialysis machines and breathing machines (ventilators).  A do not resuscitate (DNR) order, which is the instruction not to use cardiopulmonary resuscitation (CPR) if breathing or heartbeat stops.  Tube feeding.  Withholding of food and fluids.  Comfort (palliative) care when the goal becomes comfort rather than a cure.  Organ and tissue donation. A living will does not give instructions about distribution of your money and property if you should pass away. It is advisable to seek the expert advice of a lawyer in drawing up a will regarding your possessions. Decisions about taxes, beneficiaries, and asset distribution will be legally binding. This process can relieve your family and friends of any burdens surrounding disputes or questions that may come up about the allocation of your assets. DO NOT RESUSCITATE (DNR) A do not resuscitate (DNR) order is a request to not have CPR in the event that your heart stops beating or you stop breathing. Unless given other instructions, a health care provider will try to help any patient whose heart has stopped or who has stopped breathing.  This information is not intended to replace advice given to  you by your health care provider. Make sure you discuss any questions you have with your health care provider. Document Released: 08/01/2007 Document Revised: 08/16/2015 Document Reviewed: 09/11/2012 Elsevier Interactive Patient Education  2017 [place] Years, Female Preventive care refers  to lifestyle choices and visits with your health care provider that can promote health and wellness. What does preventive care include?  A yearly physical exam. This is also called an annual well check.  Dental exams once or twice a year.  Routine eye exams. Ask your health care provider how often you should have your eyes checked.  Personal lifestyle choices, including:  Daily care of your teeth and gums.  Regular physical activity.  Eating a healthy diet.  Avoiding tobacco and drug use.  Limiting alcohol use.  Practicing safe sex.  Taking low-dose aspirin daily starting at age 76.  Taking vitamin and mineral supplements as recommended by your health care provider. What happens during an annual well check? The services and screenings done by your health care provider during your annual well check will depend on your age, overall health, lifestyle risk factors, and family history of disease. Counseling  Your health care provider may ask you questions about your:  Alcohol use.  Tobacco use.  Drug use.  Emotional well-being.  Home and relationship well-being.  Sexual activity.  Eating habits.  Work and work Statistician.  Method of birth control.  Menstrual cycle.  Pregnancy history. Screening  You may have the following tests or measurements:  Height, weight, and BMI.  Blood pressure.  Lipid and cholesterol levels. These may be checked every 5 years, or more frequently if you are over 24 years old.  Skin check.  Lung cancer screening. You may have this screening every year starting at age 5 if you have a 30-pack-year history of smoking and currently smoke or have quit within the past 15 years.  Fecal occult blood test (FOBT) of the stool. You may have this test every year starting at age 64.  Flexible sigmoidoscopy or colonoscopy. You may have a sigmoidoscopy every 5 years or a colonoscopy every 10 years starting at age 61.  Hepatitis C blood  test.  Hepatitis B blood test.  Sexually transmitted disease (STD) testing.  Diabetes screening. This is done by checking your blood sugar (glucose) after you have not eaten for a while (fasting). You may have this done every 1-3 years.  Mammogram. This may be done every 1-2 years. Talk to your health care provider about when you should start having regular mammograms. This may depend on whether you have a family history of breast cancer.  BRCA-related cancer screening. This may be done if you have a family history of breast, ovarian, tubal, or peritoneal cancers.  Pelvic exam and Pap test. This may be done every 3 years starting at age 59. Starting at age 52, this may be done every 5 years if you have a Pap test in combination with an HPV test.  Bone density scan. This is done to screen for osteoporosis. You may have this scan if you are at high risk for osteoporosis. Discuss your test results, treatment options, and if necessary, the need for more tests with your health care provider. Vaccines  Your health care provider may recommend certain vaccines, such as:  Influenza vaccine. This is recommended every year.  Tetanus, diphtheria, and acellular pertussis (Tdap, Td) vaccine. You may need a Td booster every 10 years.  Varicella vaccine. You may  need this if you have not been vaccinated.  Zoster vaccine. You may need this after age 62.  Measles, mumps, and rubella (MMR) vaccine. You may need at least one dose of MMR if you were born in 1957 or later. You may also need a second dose.  Pneumococcal 13-valent conjugate (PCV13) vaccine. You may need this if you have certain conditions and were not previously vaccinated.  Pneumococcal polysaccharide (PPSV23) vaccine. You may need one or two doses if you smoke cigarettes or if you have certain conditions.  Meningococcal vaccine. You may need this if you have certain conditions.  Hepatitis A vaccine. You may need this if you have certain  conditions or if you travel or work in places where you may be exposed to hepatitis A.  Hepatitis B vaccine. You may need this if you have certain conditions or if you travel or work in places where you may be exposed to hepatitis B.  Haemophilus influenzae type b (Hib) vaccine. You may need this if you have certain conditions. Talk to your health care provider about which screenings and vaccines you need and how often you need them. This information is not intended to replace advice given to you by your health care provider. Make sure you discuss any questions you have with your health care provider. Document Released: 05/21/2015 Document Revised: 01/12/2016 Document Reviewed: 02/23/2015 Elsevier Interactive Patient Education  2017 [employer].  Steps to Quit Smoking Smoking tobacco can be bad for your health. It can also affect almost every organ in your body. Smoking puts you and people around you at risk for many serious long-lasting (chronic) diseases. Quitting smoking is hard, but it is one of the best things that you can do for your health. It is never too late to quit. What are the benefits of quitting smoking? When you quit smoking, you lower your risk for getting serious diseases and conditions. They can include:  Lung cancer or lung disease.  Heart disease.  Stroke.  Heart attack.  Not being able to have children (infertility).  Weak bones (osteoporosis) and broken bones (fractures). If you have coughing, wheezing, and shortness of breath, those symptoms may get better when you quit. You may also get sick less often. If you are pregnant, quitting smoking can help to lower your chances of having a baby of low birth weight. What can I do to help me quit smoking? Talk with your doctor about what can help you quit smoking. Some things you can do (strategies) include:  Quitting smoking totally, instead of slowly cutting back how much you smoke over a period of time.  Going to  in-person counseling. You are more likely to quit if you go to many counseling sessions.  Using resources and support systems, such as:  Online chats with a Social worker.  Phone quitlines.  Printed Furniture conservator/restorer.  Support groups or group counseling.  Text messaging programs.  Mobile phone apps or applications.  Taking medicines. Some of these medicines may have nicotine in them. If you are pregnant or breastfeeding, do not take any medicines to quit smoking unless your doctor says it is okay. Talk with your doctor about counseling or other things that can help you. Talk with your doctor about using more than one strategy at the same time, such as taking medicines while you are also going to in-person counseling. This can help make quitting easier. What things can I do to make it easier to quit? Quitting smoking might feel very  hard at first, but there is a lot that you can do to make it easier. Take these steps:  Talk to your family and friends. Ask them to support and encourage you.  Call phone quitlines, reach out to support groups, or work with a Social worker.  Ask people who smoke to not smoke around you.  Avoid places that make you want (trigger) to smoke, such as:  Bars.  Parties.  Smoke-break areas at work.  Spend time with people who do not smoke.  Lower the stress in your life. Stress can make you want to smoke. Try these things to help your stress:  Getting regular exercise.  Deep-breathing exercises.  Yoga.  Meditating.  Doing a body scan. To do this, close your eyes, focus on one area of your body at a time from head to toe, and notice which parts of your body are tense. Try to relax the muscles in those areas.  Download or buy apps on your mobile phone or tablet that can help you stick to your quit plan. There are many free apps, such as QuitGuide from the [employer] Office manager for Disease Control and Prevention). You can find more support from smokefree.gov and  other websites. This information is not intended to replace advice given to you by your health care provider. Make sure you discuss any questions you have with your health care provider. Document Released: 02/18/2009 Document Revised: 12/21/2015 Document Reviewed: 09/08/2014 Elsevier Interactive Patient Education  2017 [place]. Menopause Menopause is the normal time of life when menstrual periods stop completely. Menopause is complete when you have missed 12 consecutive menstrual periods. It usually occurs between the ages of 28 years and 25 years. Very rarely does a woman develop menopause before the age of 32 years. At menopause, your ovaries stop producing the female hormones estrogen and progesterone. This can cause undesirable symptoms and also affect your health. Sometimes the symptoms may occur 4-5 years before the menopause begins. There is no relationship between menopause and:  Oral contraceptives.  Number of children you had.  Race.  The age your menstrual periods started (menarche). Heavy smokers and very thin women may develop menopause earlier in life. What are the causes?  The ovaries stop producing the female hormones estrogen and progesterone. Other causes include:  Surgery to remove both ovaries.  The ovaries stop functioning for no known reason.  Tumors of the pituitary gland in the brain.  Medical disease that affects the ovaries and hormone production.  Radiation treatment to the abdomen or pelvis.  Chemotherapy that affects the ovaries. What are the signs or symptoms?  Hot flashes.  Night sweats.  Decrease in sex drive.  Vaginal dryness and thinning of the vagina causing painful intercourse.  Dryness of the skin and developing wrinkles.  Headaches.  Tiredness.  Irritability.  Memory problems.  Weight gain.  Bladder infections.  Hair growth of the face and chest.  Infertility. More serious symptoms include:  Loss of bone  (osteoporosis) causing breaks (fractures).  Depression.  Hardening and narrowing of the arteries (atherosclerosis) causing heart attacks and strokes. How is this diagnosed?  When the menstrual periods have stopped for 12 straight months.  Physical exam.  Hormone studies of the blood. How is this treated? There are many treatment choices and nearly as many questions about them. The decisions to treat or not to treat menopausal changes is an individual choice made with your health care provider. Your health care provider can discuss the treatments with you.  Together, you can decide which treatment will work best for you. Your treatment choices may include:  Hormone therapy (estrogen and progesterone).  Non-hormonal medicines.  Treating the individual symptoms with medicine (for example antidepressants for depression).  Herbal medicines that may help specific symptoms.  Counseling by a psychiatrist or psychologist.  Group therapy.  Lifestyle changes including:  Eating healthy.  Regular exercise.  Limiting caffeine and alcohol.  Stress management and meditation.  No treatment. Follow these instructions at home:  Take the medicine your health care provider gives you as directed.  Get plenty of sleep and rest.  Exercise regularly.  Eat a diet that contains calcium (good for the bones) and soy products (acts like estrogen hormone).  Avoid alcoholic beverages.  Do not smoke.  If you have hot flashes, dress in layers.  Take supplements, calcium, and vitamin D to strengthen bones.  You can use over-the-counter lubricants or moisturizers for vaginal dryness.  Group therapy is sometimes very helpful.  Acupuncture may be helpful in some cases. Contact a health care provider if:  You are not sure you are in menopause.  You are having menopausal symptoms and need advice and treatment.  You are still having menstrual periods after age 74 years.  You have pain with  intercourse.  Menopause is complete (no menstrual period for 12 months) and you develop vaginal bleeding.  You need a referral to a specialist (gynecologist, psychiatrist, or psychologist) for treatment. Get help right away if:  You have severe depression.  You have excessive vaginal bleeding.  You fell and think you have a broken bone.  You have pain when you urinate.  You develop leg or chest pain.  You have a fast pounding heart beat (palpitations).  You have severe headaches.  You develop vision problems.  You feel a lump in your breast.  You have abdominal pain or severe indigestion. This information is not intended to replace advice given to you by your health care provider. Make sure you discuss any questions you have with your health care provider. Document Released: 07/15/2003 Document Revised: 09/30/2015 Document Reviewed: 11/21/2012 Elsevier Interactive Patient Education  2017 [employer].

## 2016-05-22 NOTE — Assessment & Plan Note (Signed)
Not a candidate for HRT due to smoking status.

## 2016-05-22 NOTE — BH Specialist Note (Signed)
Session Start time: 3:50   End Time: 4:20 Total Time:  30 minutes Type of Service: Behavioral Health - Individual/Family Interpreter: No.   Interpreter Name & Language: n/a # [facility]  SUBJECTIVE: [name] is a 51 y.o. female  Pt. was referred by Dr Shawnie PonsPratt for:  anxiety. Pt. reports the following symptoms/concerns: Pt states that she has been experiencing difficulty relaxing, excessive worry, and restlessness, including moments of panic; increasing as she begins new work position, working from home. Pt copes by deep breathing until it passes. Duration of problem:  Over one month Severity: moderate Previous treatment: none  OBJECTIVE: Mood: Appropriate & Affect: Appropriate Risk of harm to self or others: No known risk of harm to self or others Assessments administered: PHQ9: 6/ GAD7: 10  LIFE CONTEXT:  Family & Social: Undetermined  School/ Work: Starting new job working from home Self-Care: Sleep difficulty(restlessness)  Life changes: Recent and upcoming job change What is important to pt/family (values): overall wellbeing  GOALS ADDRESSED:  -Reduce symptoms of anxiety  INTERVENTIONS: Meditation: CALM relaxation breathing exercise   ASSESSMENT:  Pt currently experiencing Adjustment disorder with anxious mood.  Pt may benefit from psychoeducation and brief therapeutic intervention regarding coping with symptoms of anxiety.   PLAN: 1. F/U with behavioral health clinician: As needed, if symptoms do not improve and/or worsen 2. Behavioral Health meds: none(does not want to take) 3. Behavioral recommendations:  -Practice CALM relaxation breathing exercise every morning, as daily "medicine" -Try Relax Melody sleep app, to find most beneficial sound for sleep -Read educational material regarding coping with anxiety and panic attacks 4. Referral: Brief Counseling/Psychotherapy and Psychoeducation 5. From scale of 1-10, how likely are you to  follow plan: 9  Rae LipsJamie C Keniyah Gelinas LCSWA Behavioral Health Clinician  Warmhandoff:   Warm Hand Off Completed.        Depression screen [facility] 2/9 05/22/2016  Decreased Interest 0  Down, Depressed, Hopeless 0  PHQ - 2 Score 0  Altered sleeping 2  Tired, decreased energy 2  Change in appetite 2  Feeling bad or failure about yourself  0  Trouble concentrating 0  Moving slowly or fidgety/restless 0  Suicidal thoughts 0  PHQ-9 Score 6   GAD 7 : Generalized Anxiety Score 05/22/2016  Nervous, Anxious, on Edge 1  Control/stop worrying 2  Worry too much - different things 2  Trouble relaxing 2  Restless 2  Easily annoyed or irritable 1  Afraid - awful might happen 0  Total GAD 7 Score 10

## 2016-05-22 NOTE — Assessment & Plan Note (Signed)
F/u with PCP.  

## 2016-05-22 NOTE — Assessment & Plan Note (Signed)
Has appointment to see gen surg--consider GI referral

## 2016-05-22 NOTE — Progress Notes (Signed)
Subjective:     Teresa Faulkner is a 51 y.o. female and is here for a comprehensive physical exam. The patient reports problems - occasional hot flashes. No period x 2 years. Desires some treatment. Reports intermittent abdominal pain with N/V and diarrhea. To see Gen Surg. Has PCP. Needs STD testing, TDaP and TB testing. Reports urinary frequency, occasional headaches, LE swelling and denies breast issues.  Social History   Social History  . Marital status: Single    Spouse name: N/A  . Number of children: N/A  . Years of education: N/A   Occupational History  . Not on file.   Social History Main Topics  . Smoking status: Current Every Day Smoker    Packs/day: 0.25    Years: 20.00    Types: Cigarettes  . Smokeless tobacco: Never Used  . Alcohol use No  . Drug use: No  . Sexual activity: Not Currently    Birth control/ protection: Surgical   Other Topics Concern  . Not on file   Social History Narrative  . No narrative on file   Health Maintenance  Topic Date Due  . TETANUS/TDAP  01/06/1986  . INFLUENZA VACCINE  12/07/2015  . PAP SMEAR  01/09/2016  . HIV Screening  Completed    The following portions of the patient's history were reviewed and updated as appropriate: allergies, current medications, past family history, past medical history, past social history, past surgical history and problem list.  Review of Systems Pertinent items noted in HPI and remainder of comprehensive ROS otherwise negative.   Objective:    There were no vitals taken for this visit. General appearance: alert, cooperative and appears stated age Head: Normocephalic, without obvious abnormality, atraumatic Neck: no adenopathy, supple, symmetrical, trachea midline and thyroid not enlarged, symmetric, no tenderness/mass/nodules Lungs: clear to auscultation bilaterally Breasts: normal appearance, no masses or tenderness Heart: regular rate and rhythm, S1, S2 normal, no murmur, click, rub  or gallop Abdomen: soft, non-tender; bowel sounds normal; no masses,  no organomegaly Pelvic: cervix normal in appearance, external genitalia normal, no adnexal masses or tenderness, no cervical motion tenderness, vagina normal without discharge and uterus firm and mildly enlarged Extremities: extremities normal, atraumatic, no cyanosis or edema Pulses: 2+ and symmetric Skin: Skin color, texture, turgor normal. No rashes or lesions Lymph nodes: Cervical, supraclavicular, and axillary nodes normal. Neurologic: Grossly normal    Urinalysis    Component Value Date/Time   COLORURINE YELLOW 10/13/2014 0020   APPEARANCEUR CLOUDY (A) 10/13/2014 0020   LABSPEC 1.020 05/22/2016 1548   PHURINE 5.5 05/22/2016 1548   GLUCOSEU NEGATIVE 05/22/2016 1548   HGBUR TRACE (A) 05/22/2016 1548   BILIRUBINUR NEGATIVE 05/22/2016 1548   KETONESUR NEGATIVE 05/22/2016 1548   PROTEINUR NEGATIVE 05/22/2016 1548   UROBILINOGEN 0.2 05/22/2016 1548   NITRITE NEGATIVE 05/22/2016 1548   LEUKOCYTESUR NEGATIVE 05/22/2016 1548     Assessment:   GYN female exam.      Plan:   Problem List Items Addressed This Visit      Unprioritized   Abdominal pain    Has appointment to see gen surg--consider GI referral      Hiatal hernia    Has appointment to see gen surg--consider GI referral      Chronic headaches    F/u with PCP      Menopause    Not a candidate for HRT due to smoking status.       Other Visit Diagnoses    Well  female exam with routine gynecological exam    -  Primary   Relevant Orders   Cytology - PAP   RPR   HIV antibody (with reflex)   Hepatitis B Surface AntiGEN   Hepatitis C antibody   Visit for screening mammogram       Relevant Orders   MM Digital Screening   Screening for malignant neoplasm of cervix       Encounter for gynecological examination without abnormal finding       Need for tetanus booster       Relevant Orders   Tdap vaccine greater than or equal to 7yo IM  (Completed)   Tobacco abuse counseling       Interested in quitting--written and verbal counseling given.   Visit for TB skin test       Relevant Medications   tuberculin injection 5 Units   Polyuria       Relevant Orders   Urine culture        See After Visit Summary for Counseling Recommendations

## 2016-05-23 LAB — URINE CULTURE

## 2016-05-23 LAB — RPR

## 2016-05-25 ENCOUNTER — Encounter: Payer: Self-pay | Admitting: *Deleted

## 2016-05-25 ENCOUNTER — Encounter: Payer: Self-pay | Admitting: Family Medicine

## 2016-05-25 ENCOUNTER — Other Ambulatory Visit: Payer: Self-pay | Admitting: *Deleted

## 2016-05-25 ENCOUNTER — Ambulatory Visit (HOSPITAL_COMMUNITY)
Admission: RE | Admit: 2016-05-25 | Discharge: 2016-05-25 | Disposition: A | Discharge status: Home / Self Care | Payer: BLUE CROSS/BLUE SHIELD | Source: Ambulatory Visit | Attending: Family Medicine | Admitting: Family Medicine

## 2016-05-25 DIAGNOSIS — F17200 Nicotine dependence, unspecified, uncomplicated: Secondary | ICD-10-CM | POA: Insufficient documentation

## 2016-05-25 DIAGNOSIS — R7611 Nonspecific reaction to tuberculin skin test without active tuberculosis: Secondary | ICD-10-CM | POA: Insufficient documentation

## 2016-05-25 NOTE — Progress Notes (Unsigned)
Here for tb test. Slightly pink with mild induration measured 1.5 mm. Sent to xray for cxr. Will notify health department.   Addendum:  06/05/16 11:59 :  Received call from Health department to clarify measurement. Measurement was actually 15mm or 1.5cm..Marland Kitchen

## 2016-05-26 ENCOUNTER — Other Ambulatory Visit: Payer: Self-pay | Admitting: Surgery

## 2016-05-26 DIAGNOSIS — E049 Nontoxic goiter, unspecified: Secondary | ICD-10-CM

## 2016-05-26 LAB — CYTOLOGY - PAP
CHLAMYDIA, DNA PROBE: NEGATIVE
Diagnosis: NEGATIVE
HPV (WINDOPATH): NOT DETECTED
Neisseria Gonorrhea: NEGATIVE
Trichomonas: NEGATIVE

## 2016-05-29 ENCOUNTER — Encounter: Payer: Self-pay | Admitting: Family Medicine

## 2016-05-31 ENCOUNTER — Other Ambulatory Visit: Payer: Self-pay

## 2016-06-05 ENCOUNTER — Telehealth: Payer: Self-pay | Admitting: *Deleted

## 2016-06-05 NOTE — Telephone Encounter (Signed)
Received a call from Freemanammy, [facility]. Asked to clarify positive tb test for this patient that I read on 05/25/16. I had documented it as 1.5 mm and actually was 1.5 cm . I clarified this with Tammy and will send her a copy of my documentation.

## 2016-06-19 ENCOUNTER — Ambulatory Visit: Payer: Self-pay

## 2016-08-25 ENCOUNTER — Inpatient Hospital Stay (HOSPITAL_COMMUNITY)
Admission: AD | Admit: 2016-08-25 | Discharge: 2016-08-25 | Disposition: A | Discharge status: Home / Self Care | Payer: BLUE CROSS/BLUE SHIELD | Source: Ambulatory Visit | Attending: Family Medicine | Admitting: Family Medicine

## 2016-08-25 ENCOUNTER — Encounter (HOSPITAL_COMMUNITY): Payer: Self-pay | Admitting: *Deleted

## 2016-08-25 DIAGNOSIS — Z202 Contact with and (suspected) exposure to infections with a predominantly sexual mode of transmission: Secondary | ICD-10-CM | POA: Diagnosis not present

## 2016-08-25 DIAGNOSIS — A5901 Trichomonal vulvovaginitis: Secondary | ICD-10-CM | POA: Insufficient documentation

## 2016-08-25 DIAGNOSIS — F17210 Nicotine dependence, cigarettes, uncomplicated: Secondary | ICD-10-CM | POA: Insufficient documentation

## 2016-08-25 HISTORY — DX: Diaphragmatic hernia without obstruction or gangrene: K44.9

## 2016-08-25 HISTORY — DX: Personal history of other infectious and parasitic diseases: Z86.19

## 2016-08-25 LAB — URINALYSIS, ROUTINE W REFLEX MICROSCOPIC
BILIRUBIN URINE: NEGATIVE
Glucose, UA: NEGATIVE mg/dL
KETONES UR: NEGATIVE mg/dL
Nitrite: NEGATIVE
Protein, ur: NEGATIVE mg/dL
Specific Gravity, Urine: 1.017 (ref 1.005–1.030)
pH: 5 (ref 5.0–8.0)

## 2016-08-25 LAB — WET PREP, GENITAL
Sperm: NONE SEEN
Yeast Wet Prep HPF POC: NONE SEEN

## 2016-08-25 MED ORDER — METRONIDAZOLE 500 MG PO TABS
2000.0000 mg | ORAL_TABLET | Freq: Once | ORAL | Status: AC
  Administered 2016-08-25: 2000 mg via ORAL
  Filled 2016-08-25: qty 4

## 2016-08-25 NOTE — Discharge Instructions (Signed)

## 2016-08-25 NOTE — MAU Provider Note (Signed)
History     CSN: [id number]  Arrival date and time: 08/25/16 4098  First Provider Initiated Contact with Patient 08/25/16 1013      Chief Complaint  Patient presents with  . Vaginal Itching  . Vaginal Discharge   Teresa Faulkner is a 51 y.o. Non pregnant female who presents with vaginal irritation & discharge. Patient is sexually active with new partner & is concerned for STDs.    Vaginal Itching  The patient's primary symptoms include genital itching and vaginal discharge. The patient's pertinent negatives include no genital lesions, genital odor, genital rash, pelvic pain or vaginal bleeding. This is a new problem. The current episode started yesterday. The problem occurs constantly. The problem has been gradually worsening. She is not pregnant. Pertinent negatives include no abdominal pain, chills, dysuria, fever, frequency, nausea or vomiting. The vaginal discharge was white and thin. There has been no bleeding. Nothing aggravates the symptoms. She has tried nothing for the symptoms. She is sexually active. It is possible that her partner has an STD. She uses tubal ligation for contraception. She is postmenopausal. Her past medical history is significant for an STD.   Past Medical History:  Diagnosis Date  . Anxiety   . Arthritis   . Bursitis   . GERD (gastroesophageal reflux disease)   . Hernia, hiatal   . Hx of trichomoniasis 2017  . Migraine     Past Surgical History:  Procedure Laterality Date  . BACK SURGERY    . TUBAL LIGATION      Family History  Problem Relation Age of Onset  . Diabetes Mother   . Diabetes Maternal Aunt   . Hypertension Maternal Aunt   . Breast cancer Other 35    Stage IV    Social History  Substance Use Topics  . Smoking status: Current Every Day Smoker    Packs/day: 0.25    Years: 20.00    Types: Cigarettes  . Smokeless tobacco: Never Used  . Alcohol use No    Allergies: No Known Allergies  Prescriptions Prior to Admission   Medication Sig Dispense Refill Last Dose  . acetaminophen (TYLENOL) 500 MG tablet Take 500 mg by mouth every 6 (six) hours as needed.   08/22/2016    Review of Systems  Constitutional: Negative.  Negative for chills and fever.  Gastrointestinal: Negative.  Negative for abdominal pain, nausea and vomiting.  Genitourinary: Positive for vaginal discharge. Negative for difficulty urinating, dyspareunia, dysuria, frequency, pelvic pain and vaginal bleeding.   Physical Exam   Blood pressure 132/77, pulse 75, temperature 97.7 F (36.5 C), temperature source Oral, resp. rate 16, height  (1.702 m), weight 213 lb 1.3 oz (96.7 kg).  Physical Exam  Nursing note and vitals reviewed. Constitutional: She is oriented to person, place, and time. She appears well-developed and well-nourished. No distress.  HENT:  Head: Normocephalic and atraumatic.  Eyes: Conjunctivae are normal. Right eye exhibits no discharge. Left eye exhibits no discharge. No scleral icterus.  Neck: Normal range of motion.  Respiratory: Effort normal. No respiratory distress.  GI: Soft. She exhibits no distension. There is no tenderness. There is no rebound and no guarding.  Genitourinary: Uterus normal. Cervix exhibits discharge. Cervix exhibits no motion tenderness and no friability. Right adnexum displays no mass and no tenderness. Left adnexum displays no mass and no tenderness. There is erythema in the vagina. No bleeding in the vagina.  Neurological: She is alert and oriented to person, place, and time.  Skin: Skin  is warm and dry. She is not diaphoretic.  Psychiatric: She has a normal mood and affect. Her behavior is normal. Judgment and thought content normal.    MAU Course  Procedures Results for orders placed or performed during the hospital encounter of 08/25/16 (from the past 24 hour(s))  Urinalysis, Routine w reflex microscopic     Status: Abnormal   Collection Time: 08/25/16  9:40 AM  Result Value Ref Range    Color, Urine YELLOW YELLOW   APPearance HAZY (A) CLEAR   Specific Gravity, Urine 1.017 1.005 - 1.030   pH 5.0 5.0 - 8.0   Glucose, UA NEGATIVE NEGATIVE mg/dL   Hgb urine dipstick SMALL (A) NEGATIVE   Bilirubin Urine NEGATIVE NEGATIVE   Ketones, ur NEGATIVE NEGATIVE mg/dL   Protein, ur NEGATIVE NEGATIVE mg/dL   Nitrite NEGATIVE NEGATIVE   Leukocytes, UA LARGE (A) NEGATIVE   RBC / HPF 0-5 0 - 5 RBC/hpf   WBC, UA 0-5 0 - 5 WBC/hpf   Bacteria, UA RARE (A) NONE SEEN   Squamous Epithelial / LPF 6-30 (A) NONE SEEN   Mucous PRESENT   Wet prep, genital     Status: Abnormal   Collection Time: 08/25/16 10:43 AM  Result Value Ref Range   Yeast Wet Prep HPF POC NONE SEEN NONE SEEN   Trich, Wet Prep PRESENT (A) NONE SEEN   Clue Cells Wet Prep HPF POC PRESENT (A) NONE SEEN   WBC, Wet Prep HPF POC MANY (A) NONE SEEN   Sperm NONE SEEN     MDM GC/CT & wet prep Pt declines blood work Flagyl 2gm PO in MAU for trich tx Assessment and Plan  A:  1. Trichomonal vaginitis    P: Discharge home Expedited partner rx & info sheet given to pt GC/CT pending No intercourse x 1 week after both treated f/u with PCP as needed  Judeth Horn 08/25/2016, 10:13 AM

## 2016-08-25 NOTE — MAU Note (Signed)
Patient presents to MAU with c/o vaginal itching and burning.  States having some white discharge.  Patient is postmenopausal.  Had diarrhea and vomiting a few days ago, but none since Wednesday.  Afebrile.

## 2016-08-28 LAB — GC/CHLAMYDIA PROBE AMP (~~LOC~~) NOT AT ARMC
CHLAMYDIA, DNA PROBE: NEGATIVE
NEISSERIA GONORRHEA: NEGATIVE

## 2016-09-25 ENCOUNTER — Ambulatory Visit (INDEPENDENT_AMBULATORY_CARE_PROVIDER_SITE_OTHER): Payer: BLUE CROSS/BLUE SHIELD | Admitting: Physician Assistant

## 2016-09-25 ENCOUNTER — Encounter (INDEPENDENT_AMBULATORY_CARE_PROVIDER_SITE_OTHER): Payer: Self-pay | Admitting: Physician Assistant

## 2016-09-25 DIAGNOSIS — G8929 Other chronic pain: Secondary | ICD-10-CM | POA: Diagnosis not present

## 2016-09-25 DIAGNOSIS — M545 Low back pain: Secondary | ICD-10-CM | POA: Diagnosis not present

## 2016-09-25 MED ORDER — NAPROXEN 500 MG PO TABS: 500.0000 mg | ORAL_TABLET | Freq: Two times a day (BID) | ORAL | 0 refills | Status: DC

## 2016-09-25 NOTE — Progress Notes (Signed)
Office Visit Note   Patient: Teresa Faulkner           Date of Birth: 05/11/1965           MRN: [id number] Visit Date: 09/25/2016              Requested by: Fleet ContrasAvbuere, Edwin, MD [address] [place], [place] [id number] PCP: Fleet ContrasAvbuere, Edwin, MD   Assessment & Plan: Visit Diagnoses:  1. Chronic bilateral low back pain, with sciatica presence unspecified     Plan: Place her on naproxen and no other NSAIDs while on this. However undergo an MRI of her lumbar spine rule out HNP as the source of her pain in the left leg. Follow up after the MRI to go over results and discuss further treatment.  Follow-Up Instructions: No Follow-up on file.   Orders:  Orders Placed This Encounter  Procedures  . MR Lumbar Spine w/o contrast   Meds ordered this encounter  Medications  . naproxen (NAPROSYN) 500 MG tablet    Sig: Take 1 tablet (500 mg total) by mouth 2 (two) times daily with a meal.    Dispense:  60 tablet    Refill:  0      Procedures: No procedures performed   Clinical Data: No additional findings.   Subjective: Chief Complaint  Patient presents with  . Left Hip - Pain  . Lower Back - Pain, Follow-up    HPI Teresa Faulkner returns today for left hip and low back pain for the past 3-4 weeks. His scar back in November at that time she was having some low back pain with radicular symptoms down the left leg which is resolved. She's had no new injury. She is now having just pain in her low back down into the hip and the anterior aspect of her left thigh. This pain is worse whenever she is trying to lie on the left side and also whenever she is just lying in bed. Pain is 3 out of 10 pain worse. She's taken some Tylenol.  Review of Systems Denies groin pain, numbness tingling down either leg. Negative for fevers chills shortness breath chest pain.  Objective: Vital Signs: There were no vitals taken for this visit.  Physical Exam  Constitutional: She is oriented to person, place,  and time. She appears well-developed. No distress.  Cardiovascular: Intact distal pulses.   Pulmonary/Chest: Effort normal.  Neurological: She is alert and oriented to person, place, and time.  Skin: She is not diaphoretic.  Psychiatric: She has a normal mood and affect. Her behavior is normal.    Ortho Exam Tenderness lower left lumbar paraspinous region. Negative straight leg raise bilaterally. 5 out of 5 strengths lower extremities against resistance throughout. She is able to do for flex the lumbar spine and touch her toes she has limited extension lumbar spine with pain in the lower lumbar spinal region. Excellent range of motion bilateral hips without pain. Tenderness over the left trochanteric region. Negative Patrick's test bilaterally. Left knee excellent range of motion without pain she does have some patella femoral crepitus. No tenderness with palpation along the medial lateral joint line the left knee no instability valgus varus stressing left knee. She continues to do home exercise program for her back including some ITB and stretching exercises and stopped by therapy. Specialty Comments:  No specialty comments available.  Imaging: No results found.   PMFS History: Patient Active Problem List   Diagnosis Date Noted  . 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  Abdominal pain 05/22/2016  . Hiatal hernia 05/22/2016  . Chronic headaches 05/22/2016  . Menopause 05/22/2016   Past Medical History:  Diagnosis Date  . Anxiety   . Arthritis   . Bursitis   . GERD (gastroesophageal reflux disease)   . Hernia, hiatal   . Hx of trichomoniasis 2017  . Migraine     Family History  Problem Relation Age of Onset  . Diabetes Mother   . Diabetes Maternal Aunt   . Hypertension Maternal Aunt   . Breast cancer Other 35       Stage IV    Past Surgical History:  Procedure Laterality Date  . BACK SURGERY    . TUBAL LIGATION     Social History   Occupational History  . Not on file.    Social History Main Topics  . Smoking status: Current Every Day Smoker    Packs/day: 0.25    Years: 20.00    Types: Cigarettes  . Smokeless tobacco: Never Used  . Alcohol use No  . Drug use: No  . Sexual activity: Yes    Birth control/ protection: Surgical

## 2016-10-07 ENCOUNTER — Ambulatory Visit
Admission: RE | Admit: 2016-10-07 | Discharge: 2016-10-07 | Disposition: A | Discharge status: Home / Self Care | Payer: BLUE CROSS/BLUE SHIELD | Source: Ambulatory Visit | Attending: Physician Assistant | Admitting: Physician Assistant

## 2016-10-07 DIAGNOSIS — G8929 Other chronic pain: Secondary | ICD-10-CM

## 2016-10-07 DIAGNOSIS — M545 Low back pain: Principal | ICD-10-CM

## 2016-10-11 ENCOUNTER — Ambulatory Visit (INDEPENDENT_AMBULATORY_CARE_PROVIDER_SITE_OTHER): Payer: BLUE CROSS/BLUE SHIELD | Admitting: Physician Assistant

## 2016-10-11 ENCOUNTER — Encounter (INDEPENDENT_AMBULATORY_CARE_PROVIDER_SITE_OTHER): Payer: Self-pay | Admitting: Physician Assistant

## 2016-10-11 DIAGNOSIS — M5442 Lumbago with sciatica, left side: Secondary | ICD-10-CM

## 2016-10-11 MED ORDER — NAPROXEN 500 MG PO TABS: 500.0000 mg | ORAL_TABLET | Freq: Two times a day (BID) | ORAL | 0 refills | Status: DC

## 2016-10-11 MED ORDER — METHYLPREDNISOLONE 4 MG PO TABS: ORAL_TABLET | ORAL | 0 refills | Status: DC

## 2016-10-11 NOTE — Progress Notes (Signed)
Teresa Faulkner comes in today for follow-up of her low back pain with radicular pain down the left leg. She's undergone an MRI of her lumbar spine and is here today for follow-up. She still having low back pain and radicular symptoms mainly down the anterior aspect of her left thigh occasionally goes into her great toe. States pain overall is unchanged.  Physical exam: General well-developed well-nourished female in no acute distress. She does sit with her hips rotated so the pressures on the right hip. Good range of motion bilateral hips without pain. Good range of motion bilateral knees without pain. Tenderness over the left lower lumbar paraspinous region. Negative straight leg raise bilaterally  MRI of the lumbar spine dated 10/07/2016 is reviewed with patient with a lumbar spine model for visualization. MRI shows paracentral disc protrusion at L5 and S1 which impinges upon the left S1 nerve root. 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  Plan: Discussed with patient sitting her for epidural steroid injection lumbar spine she defers. She does not want to go back to therapy either. Therefore back exercises handouts are given. We'll also place her on a Medrol Dosepak again. She'll then begin her naproxen once she is finished with the Medrol Dosepak. See her back in 6 weeks' check progress lack of. Questions encouraged and answered.

## 2016-11-22 ENCOUNTER — Ambulatory Visit (INDEPENDENT_AMBULATORY_CARE_PROVIDER_SITE_OTHER): Payer: BLUE CROSS/BLUE SHIELD | Admitting: Physician Assistant

## 2017-01-16 ENCOUNTER — Emergency Department (HOSPITAL_COMMUNITY)
Admission: EM | Admit: 2017-01-16 | Discharge: 2017-01-16 | Disposition: A | Discharge status: Home / Self Care | Payer: BLUE CROSS/BLUE SHIELD | Attending: Emergency Medicine | Admitting: Emergency Medicine

## 2017-01-16 ENCOUNTER — Encounter (HOSPITAL_COMMUNITY): Payer: Self-pay

## 2017-01-16 DIAGNOSIS — F419 Anxiety disorder, unspecified: Secondary | ICD-10-CM | POA: Insufficient documentation

## 2017-01-16 DIAGNOSIS — F17210 Nicotine dependence, cigarettes, uncomplicated: Secondary | ICD-10-CM | POA: Diagnosis not present

## 2017-01-16 DIAGNOSIS — R21 Rash and other nonspecific skin eruption: Secondary | ICD-10-CM | POA: Diagnosis present

## 2017-01-16 DIAGNOSIS — L509 Urticaria, unspecified: Secondary | ICD-10-CM | POA: Diagnosis not present

## 2017-01-16 MED ORDER — PREDNISONE 10 MG (21) PO TBPK: ORAL_TABLET | Freq: Every day | ORAL | 0 refills | Status: DC

## 2017-01-16 MED ORDER — PREDNISONE 20 MG PO TABS
60.0000 mg | ORAL_TABLET | Freq: Once | ORAL | Status: AC
  Administered 2017-01-16: 60 mg via ORAL
  Filled 2017-01-16: qty 3

## 2017-01-16 MED ORDER — HYDROXYZINE HCL 50 MG/ML IM SOLN
50.0000 mg | Freq: Once | INTRAMUSCULAR | Status: AC
  Administered 2017-01-16: 50 mg via INTRAMUSCULAR
  Filled 2017-01-16 (×2): qty 1

## 2017-01-16 MED ORDER — HYDROXYZINE HCL 25 MG PO TABS: 25.0000 mg | ORAL_TABLET | Freq: Four times a day (QID) | ORAL | 0 refills | Status: DC

## 2017-01-16 NOTE — ED Provider Notes (Signed)
[facility]-EMERGENCY DEPT Provider Note   CSN: [id number] Arrival date & time: 01/16/17  1531     History   Chief Complaint Chief Complaint  Patient presents with  . Rash    HPI Teresa Faulkner is a 51 y.o. female presents to ED for evaluation of rash to face and bilateral hands x 3 days.  Rash is erythematous, itchy and sometimes stings when she sweats. Rash began the day after her dog got dipped with anti-flea medicine. States she has never used this product before on her dog. States she picked up her dog that day and cannot remember if she washed her hands after.  Woke up this morning with rash to right upper eyelid which concerned her and came in to be evaluated.  No fevers, chills, recent URI or GI illnesses. No recent new topical products or detergent. No one at home with same rash. Denies lip/tongue or throat swelling, cough, SOB, choking. Has tried benadryl with minimal relief.   HPI  Past Medical History:  Diagnosis Date  . Anxiety   . Arthritis   . Bursitis   . GERD (gastroesophageal reflux disease)   . Hernia, hiatal   . Hx of trichomoniasis 2017  . Migraine     Patient Active Problem List   Diagnosis Date Noted  . Low back pain with left-sided sciatica 10/11/2016  . 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 Abdominal pain 05/22/2016  . Hiatal hernia 05/22/2016  . Chronic headaches 05/22/2016  . Menopause 05/22/2016    Past Surgical History:  Procedure Laterality Date  . BACK SURGERY    . TUBAL LIGATION      OB History    Gravida Para Term Preterm AB Living   2 2 2  0 0 2   SAB TAB Ectopic Multiple Live Births   0 0 0 0         Home Medications    Prior to Admission medications   Medication Sig Start Date End Date Taking? Authorizing Provider  acetaminophen (TYLENOL) 500 MG tablet Take 1,000 mg by mouth every 6 (six) hours as needed for moderate pain or headache.    Yes [provider]  naproxen (NAPROSYN) 500 MG tablet Take 1 tablet (500 mg total) by  mouth 2 (two) times daily with a meal. Patient taking differently: Take 500 mg by mouth 2 (two) times daily as needed for moderate pain.  10/11/16  Yes Kirtland Bouchardlark, Gilbert W, PA-C  hydrOXYzine (ATARAX/VISTARIL) 25 MG tablet Take 1 tablet (25 mg total) by mouth every 6 (six) hours. 01/16/17   Liberty HandyGibbons, Farrell Broerman J, PA-C  predniSONE (STERAPRED UNI-PAK 21 TAB) 10 MG (21) TBPK tablet Take by mouth daily. Take 6 tabs by mouth daily  for 2 days, then 5 tabs for 2 days, then 4 tabs for 2 days, then 3 tabs for 2 days, 2 tabs for 2 days, then 1 tab by mouth daily for 2 days 01/16/17   Liberty HandyGibbons, Shatora Weatherbee J, PA-C    Family History Family History  Problem Relation Age of Onset  . Diabetes Mother   . Diabetes Maternal Aunt   . Hypertension Maternal Aunt   . Breast cancer Other 35       Stage IV    Social History Social History  Substance Use Topics  . Smoking status: Current Every Day Smoker    Packs/day: 0.25    Years: 20.00    Types: Cigarettes  . Smokeless tobacco: Never Used  . Alcohol use No  Allergies   Patient has no known allergies.   Review of Systems Review of Systems  Constitutional: Negative for chills and fever.  HENT: Negative for facial swelling and trouble swallowing.   Eyes: Positive for itching. Negative for pain.  Musculoskeletal: Negative for arthralgias.  Skin: Positive for rash.  Allergic/Immunologic: Negative for immunocompromised state.     Physical Exam Updated Vital Signs BP 129/64 (BP Location: Left Arm)   Pulse 81   Temp 98.3 F (36.8 C) (Oral)   Resp 16   Ht  (1.702 m)   Wt 94.3 kg (208 lb)   SpO2 100%   BMI 32.58 kg/m   Physical Exam  Constitutional: She is oriented to person, place, and time. She appears well-developed and well-nourished. No distress.  NAD.  HENT:  Head: Normocephalic and atraumatic.  Right Ear: External ear normal.  Left Ear: External ear normal.  Nose: Nose normal.  Eyes: Conjunctivae and EOM are normal. No scleral  icterus.  Neck: Normal range of motion. Neck supple.  Cardiovascular: Normal rate, regular rhythm and normal heart sounds.   No murmur heard. Pulmonary/Chest: Effort normal and breath sounds normal. She has no wheezes.  Musculoskeletal: Normal range of motion. She exhibits no deformity.  Neurological: She is alert and oriented to person, place, and time.  Skin: Skin is warm and dry. Capillary refill takes less than 2 seconds. Rash noted.  Raised, erythematous, maculopapular/urticarial rash over dorsal aspect of right fingers 3-5, right upper eye lid, right cheek and below right ear. Rash is in streak/linear distribution. No vesicles. No fluctuance or drainage. Non tender.  Psychiatric: She has a normal mood and affect. Her behavior is normal. Judgment and thought content normal.  Nursing note and vitals reviewed.    ED Treatments / Results  Labs (all labs ordered are listed, but only abnormal results are displayed) Labs Reviewed - No data to display  EKG  EKG Interpretation None       Radiology No results found.  Procedures Procedures (including critical care time)  Medications Ordered in ED Medications  hydrOXYzine (VISTARIL) injection 50 mg (not administered)  predniSONE (DELTASONE) tablet 60 mg (60 mg Oral Given 01/16/17 1807)     Initial Impression / Assessment and Plan / ED Course  I have reviewed the triage vital signs and the nursing notes.  Pertinent labs & imaging results that were available during my care of the patient were reviewed by me and considered in my medical decision making (see chart for details).    51 y.o. yo female presents with raised, erythematous, pruritic rash that started day after her dog got dipped with anti flea medicine.  Rash has streaking, linear pattern.  Patient is actively itching at rash during exam.  No facial or oral angioedema.  No respiratory compromise.  Noted recent exposure to possible allergen.  No new topical hygiene products  used.  No new medications.  No evidence of animal bite wounds.  No fluctuance, induration that would suggest abscess or cellulitis. Very low suspicion for dermatological emergency including EM, SJS, TEN or meningococcemia.  Patient otherwise feels well and at baseline, no URI or viral type systemic symptoms that would suggest viral exanthem.  Suspect urticaria secondary to dog/flea allergen. Patient will be discharged with antihistamines, ice and close f/u with PCP.  Will give steroids as she has it on her face, upper eye lid and hands. Would like to prevent periorbital involvement.  Strict ED return precautions given, pt is aware of red  flag symptoms that would warrant return to ED for further evaluation and tx.  Final Clinical Impressions(s) / ED Diagnoses   Final diagnoses:  Urticaria    New Prescriptions New Prescriptions   HYDROXYZINE (ATARAX/VISTARIL) 25 MG TABLET    Take 1 tablet (25 mg total) by mouth every 6 (six) hours.   PREDNISONE (STERAPRED UNI-PAK 21 TAB) 10 MG (21) TBPK TABLET    Take by mouth daily. Take 6 tabs by mouth daily  for 2 days, then 5 tabs for 2 days, then 4 tabs for 2 days, then 3 tabs for 2 days, 2 tabs for 2 days, then 1 tab by mouth daily for 2 days     Jerrell Mylar 01/16/17 Franki Cabot, MD 01/16/17 [phone]

## 2017-01-16 NOTE — Discharge Instructions (Signed)
Based on your history and exam I suspect your rash is allergic.   Take prednisone pack as prescribed. Additionally, take hydroxozine for itching. You should take a daily allergy medication (allegra, zyrtec, etc).  For further itch control you can apply a thin layer of over the counter hydrocortisone cream.   Avoid scratching or itching.   Monitor for signs of infection including increased redness, warmth, pain, yellow discharge, fevers

## 2017-01-16 NOTE — ED Triage Notes (Signed)
Pt reports hives to hands and face that come and go since Saturday after getting dog dipped for fleas. Pt has been taking benadryl with minimal relief. NAD/ resp even and unlabored

## 2017-08-21 ENCOUNTER — Encounter (HOSPITAL_COMMUNITY): Payer: Self-pay | Admitting: Emergency Medicine

## 2017-08-21 ENCOUNTER — Emergency Department (HOSPITAL_COMMUNITY): Payer: Self-pay

## 2017-08-21 ENCOUNTER — Emergency Department (HOSPITAL_COMMUNITY)
Admission: EM | Admit: 2017-08-21 | Discharge: 2017-08-22 | Disposition: A | Discharge status: Home / Self Care | Payer: Self-pay | Attending: Emergency Medicine | Admitting: Emergency Medicine

## 2017-08-21 ENCOUNTER — Other Ambulatory Visit: Payer: Self-pay

## 2017-08-21 DIAGNOSIS — Z79899 Other long term (current) drug therapy: Secondary | ICD-10-CM | POA: Insufficient documentation

## 2017-08-21 DIAGNOSIS — F17210 Nicotine dependence, cigarettes, uncomplicated: Secondary | ICD-10-CM | POA: Insufficient documentation

## 2017-08-21 DIAGNOSIS — M25522 Pain in left elbow: Secondary | ICD-10-CM | POA: Insufficient documentation

## 2017-08-21 MED ORDER — NAPROXEN 375 MG PO TABS: 375.0000 mg | ORAL_TABLET | Freq: Two times a day (BID) | ORAL | 0 refills | Status: DC

## 2017-08-21 MED ORDER — IBUPROFEN 800 MG PO TABS
800.0000 mg | ORAL_TABLET | Freq: Once | ORAL | Status: DC
  Filled 2017-08-21: qty 1

## 2017-08-21 NOTE — ED Triage Notes (Signed)
Pt reports L elbow pain x 1 week. Unknown cause, denies injury. Has been taking OTC tylenol without relief.

## 2017-08-21 NOTE — ED Provider Notes (Signed)
[facility] Provider Note   CSN: [id number] Arrival date & time: 08/21/17  2100     History   Chief Complaint Chief Complaint  Patient presents with  . Elbow Pain    HPI Alcide CleverMargaret H Romulus is a 52 y.o. female.  Patient complaining of left elbow pain onset one week ago. 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 She is right hand dominant. No known injury. Pain localized to olecranon process. Good ROM and grip strength. Normal sensation.  The history is provided by the patient. No language interpreter was used.  Extremity Pain  This is a new problem. The current episode started more than 1 week ago. The problem occurs constantly. The problem has not changed since onset.She has tried acetaminophen and a cold compress for the symptoms. The treatment provided no relief.    Past Medical History:  Diagnosis Date  . Anxiety   . Arthritis   . Bursitis   . GERD (gastroesophageal reflux disease)   . Hernia, hiatal   . Hx of trichomoniasis 2017  . Migraine     Patient Active Problem List   Diagnosis Date Noted  . Low back pain with left-sided sciatica 10/11/2016  . 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 Abdominal pain 05/22/2016  . Hiatal hernia 05/22/2016  . Chronic headaches 05/22/2016  . Menopause 05/22/2016    Past Surgical History:  Procedure Laterality Date  . BACK SURGERY    . TUBAL LIGATION       OB History    Gravida  2   Para  2   Term  2   Preterm  0   AB  0   Living  2     SAB  0   TAB  0   Ectopic  0   Multiple  0   Live Births               Home Medications    Prior to Admission medications   Medication Sig Start Date End Date Taking? Authorizing Provider  acetaminophen (TYLENOL) 500 MG tablet Take 1,000 mg by mouth every 6 (six) hours as needed for moderate pain or headache.     [provider]  hydrOXYzine (ATARAX/VISTARIL) 25 MG tablet Take 1 tablet (25 mg total) by mouth every 6  (six) hours. 01/16/17   Liberty HandyGibbons, Claudia J, PA-C  naproxen (NAPROSYN) 500 MG tablet Take 1 tablet (500 mg total) by mouth 2 (two) times daily with a meal. Patient taking differently: Take 500 mg by mouth 2 (two) times daily as needed for moderate pain.  10/11/16   Kirtland Bouchardlark, Gilbert W, PA-C  predniSONE (STERAPRED UNI-PAK 21 TAB) 10 MG (21) TBPK tablet Take by mouth daily. Take 6 tabs by mouth daily  for 2 days, then 5 tabs for 2 days, then 4 tabs for 2 days, then 3 tabs for 2 days, 2 tabs for 2 days, then 1 tab by mouth daily for 2 days 01/16/17   Liberty HandyGibbons, Claudia J, PA-C    Family History Family History  Problem Relation Age of Onset  . Diabetes Mother   . Diabetes Maternal Aunt   . Hypertension Maternal Aunt   . Breast cancer Other 35       Stage IV    Social History Social History   Tobacco Use  . Smoking status: Current Every Day Smoker    Packs/day: 0.25    Years: 20.00    Pack years: 5.00  Types: Cigarettes  . Smokeless tobacco: Never Used  Substance Use Topics  . Alcohol use: No  . Drug use: No     Allergies   Patient has no known allergies.   Review of Systems Review of Systems  Musculoskeletal: Positive for arthralgias.  All other systems reviewed and are negative.    Physical Exam Updated Vital Signs BP 129/73 (BP Location: Right Arm)   Pulse 84   Temp 97.7 F (36.5 C) (Oral)   Resp 18   Ht 5\' 7"  (1.702 m)   Wt 97.5 kg (215 lb)   SpO2 100%   BMI 33.67 kg/m   Physical Exam  Constitutional: She is oriented to person, place, and time. She appears well-developed and well-nourished.  HENT:  Head: Normocephalic.  Eyes: Conjunctivae are normal.  Neck: Neck supple.  Cardiovascular: Normal rate and regular rhythm.  Pulmonary/Chest: Effort normal and breath sounds normal.  Abdominal: Soft. Bowel sounds are normal.  Musculoskeletal: Normal range of motion. She exhibits tenderness. She exhibits no edema or deformity.       Left elbow: She exhibits normal  range of motion and no deformity. No tenderness found. No olecranon process tenderness noted.       Arms: Lymphadenopathy:    She has no cervical adenopathy.  Neurological: She is alert and oriented to person, place, and time.  Skin: Skin is warm and dry.  Psychiatric: She has a normal mood and affect.  Nursing note and vitals reviewed.    ED Treatments / Results  Labs (all labs ordered are listed, but only abnormal results are displayed) Labs Reviewed - No data to display  EKG None  Radiology Dg Elbow Complete Left  Result Date: 08/21/2017 CLINICAL DATA:  Pt reports L elbow pain x 1 week. Unknown cause, denies injury. Has been taking OTC tylenol without relief. EXAM: LEFT ELBOW - COMPLETE 3+ VIEW COMPARISON:  None. FINDINGS: No evidence of fracture of the ulna or humerus. The radial head is normal. No joint effusion. IMPRESSION: No acute osseous findings.  No joint effusion. Electronically Signed   By: Genevive Bi M.D.   On: 08/21/2017 22:07    Procedures Procedures (including critical care time)  Medications Ordered in ED Medications - No data to display   Initial Impression / Assessment and Plan / ED Course  I have reviewed the triage vital signs and the nursing notes.  Pertinent labs & imaging results that were available during my care of the patient were reviewed by me and considered in my medical decision making (see chart for details).     Patient X-Ray negative for obvious fracture or dislocation.  Pt advised to follow up with orthopedics. Patient given ace wrap, sling while in ED, conservative therapy recommended and discussed. Patient will be discharged home & is agreeable with above plan. Returns precautions discussed. Pt appears safe for discharge.  Final Clinical Impressions(s) / ED Diagnoses   Final diagnoses:  Left elbow pain    ED Discharge Orders        Ordered    naproxen (NAPROSYN) 375 MG tablet  2 times daily     08/21/17 2356         Felicie Morn, NP 08/22/17 1610    Shon Baton, MD 08/22/17 [phone]

## 2017-09-13 ENCOUNTER — Emergency Department (HOSPITAL_COMMUNITY)
Admission: EM | Admit: 2017-09-13 | Discharge: 2017-09-13 | Discharge status: Against Medical Advice | Payer: Self-pay | Attending: Emergency Medicine | Admitting: Emergency Medicine

## 2017-09-13 ENCOUNTER — Other Ambulatory Visit: Payer: Self-pay

## 2017-09-13 ENCOUNTER — Encounter (HOSPITAL_COMMUNITY): Payer: Self-pay | Admitting: Emergency Medicine

## 2017-09-13 DIAGNOSIS — R600 Localized edema: Secondary | ICD-10-CM | POA: Insufficient documentation

## 2017-09-13 DIAGNOSIS — H9319 Tinnitus, unspecified ear: Secondary | ICD-10-CM | POA: Insufficient documentation

## 2017-09-13 DIAGNOSIS — Z5321 Procedure and treatment not carried out due to patient leaving prior to being seen by health care provider: Secondary | ICD-10-CM | POA: Insufficient documentation

## 2017-09-13 LAB — I-STAT CG4 LACTIC ACID, ED: LACTIC ACID, VENOUS: 1.04 mmol/L (ref 0.5–1.9)

## 2017-09-13 LAB — CBC WITH DIFFERENTIAL/PLATELET
BASOS ABS: 0 10*3/uL (ref 0.0–0.1)
BASOS PCT: 0 %
EOS ABS: 0.2 10*3/uL (ref 0.0–0.7)
Eosinophils Relative: 2 %
HEMATOCRIT: 42.7 % (ref 36.0–46.0)
Hemoglobin: 13.4 g/dL (ref 12.0–15.0)
Lymphocytes Relative: 41 %
Lymphs Abs: 4.1 10*3/uL — ABNORMAL HIGH (ref 0.7–4.0)
MCH: 28.3 pg (ref 26.0–34.0)
MCHC: 31.4 g/dL (ref 30.0–36.0)
MCV: 90.3 fL (ref 78.0–100.0)
Monocytes Absolute: 0.4 10*3/uL (ref 0.1–1.0)
Monocytes Relative: 4 %
NEUTROS ABS: 5.3 10*3/uL (ref 1.7–7.7)
Neutrophils Relative %: 53 %
Platelets: 277 10*3/uL (ref 150–400)
RBC: 4.73 MIL/uL (ref 3.87–5.11)
RDW: 14.4 % (ref 11.5–15.5)
WBC: 9.9 10*3/uL (ref 4.0–10.5)

## 2017-09-13 LAB — COMPREHENSIVE METABOLIC PANEL
ALK PHOS: 62 U/L (ref 38–126)
ALT: 16 U/L (ref 14–54)
ANION GAP: 6 (ref 5–15)
AST: 19 U/L (ref 15–41)
Albumin: 3.5 g/dL (ref 3.5–5.0)
BUN: 10 mg/dL (ref 6–20)
CALCIUM: 9.4 mg/dL (ref 8.9–10.3)
CO2: 26 mmol/L (ref 22–32)
CREATININE: 0.96 mg/dL (ref 0.44–1.00)
Chloride: 112 mmol/L — ABNORMAL HIGH (ref 101–111)
Glucose, Bld: 94 mg/dL (ref 65–99)
Potassium: 4.7 mmol/L (ref 3.5–5.1)
Sodium: 144 mmol/L (ref 135–145)
TOTAL PROTEIN: 6.4 g/dL — AB (ref 6.5–8.1)
Total Bilirubin: 0.7 mg/dL (ref 0.3–1.2)

## 2017-09-13 NOTE — ED Triage Notes (Signed)
Patient complains of right sided facial swelling, itching, and burning that started yesterday. Took benadryl yesterday with minimal relief yesterday.

## 2017-09-13 NOTE — ED Notes (Signed)
Patient reports swelling of left face, she states she went to sleep and when she woke up, she had ringing on the ear then swelling of face. She reports taking benedryl before bed because she felt "welts behind my left ear"

## 2017-09-13 NOTE — ED Provider Notes (Signed)
Pt here with complaint of facial swelling.  Unfortunately pt left AMA prior to my evaluation.    BP 121/74   Pulse 68   Temp 97.9 F (36.6 C)   Resp 18   SpO2 97%   Results for orders placed or performed during the hospital encounter of 09/13/17  Comprehensive metabolic panel  Result Value Ref Range   Sodium 144 135 - 145 mmol/L   Potassium 4.7 3.5 - 5.1 mmol/L   Chloride 112 (H) 101 - 111 mmol/L   CO2 26 22 - 32 mmol/L   Glucose, Bld 94 65 - 99 mg/dL   BUN 10 6 - 20 mg/dL   Creatinine, Ser 1.61 0.44 - 1.00 mg/dL   Calcium 9.4 8.9 - 09.6 mg/dL   Total Protein 6.4 (L) 6.5 - 8.1 g/dL   Albumin 3.5 3.5 - 5.0 g/dL   AST 19 15 - 41 U/L   ALT 16 14 - 54 U/L   Alkaline Phosphatase 62 38 - 126 U/L   Total Bilirubin 0.7 0.3 - 1.2 mg/dL   GFR calc non Af Amer >60 >60 mL/min   GFR calc Af Amer >60 >60 mL/min   Anion gap 6 5 - 15  CBC with Differential  Result Value Ref Range   WBC 9.9 4.0 - 10.5 K/uL   RBC 4.73 3.87 - 5.11 MIL/uL   Hemoglobin 13.4 12.0 - 15.0 g/dL   HCT 04.5 40.9 - 81.1 %   MCV 90.3 78.0 - 100.0 fL   MCH 28.3 26.0 - 34.0 pg   MCHC 31.4 30.0 - 36.0 g/dL   RDW 91.4 78.2 - 95.6 %   Platelets 277 150 - 400 K/uL   Neutrophils Relative % 53 %   Neutro Abs 5.3 1.7 - 7.7 K/uL   Lymphocytes Relative 41 %   Lymphs Abs 4.1 (H) 0.7 - 4.0 K/uL   Monocytes Relative 4 %   Monocytes Absolute 0.4 0.1 - 1.0 K/uL   Eosinophils Relative 2 %   Eosinophils Absolute 0.2 0.0 - 0.7 K/uL   Basophils Relative 0 %   Basophils Absolute 0.0 0.0 - 0.1 K/uL  I-Stat CG4 Lactic Acid, ED  Result Value Ref Range   Lactic Acid, Venous 1.04 0.5 - 1.9 mmol/L   Dg Elbow Complete Left  Result Date: 08/21/2017 CLINICAL DATA:  Pt reports L elbow pain x 1 week. Unknown cause, denies injury. Has been taking OTC tylenol without relief. EXAM: LEFT ELBOW - COMPLETE 3+ VIEW COMPARISON:  None. FINDINGS: No evidence of fracture of the ulna or humerus. The radial head is normal. No joint effusion.  IMPRESSION: No acute osseous findings.  No joint effusion. Electronically Signed   By: Genevive Bi M.D.   On: 08/21/2017 22:07      Fayrene Helper, PA-C 09/13/17 1406    Vanetta Mulders, MD 09/13/17 [phone]

## 2018-06-05 ENCOUNTER — Ambulatory Visit (INDEPENDENT_AMBULATORY_CARE_PROVIDER_SITE_OTHER): Payer: Self-pay | Admitting: Physician Assistant

## 2018-07-07 ENCOUNTER — Emergency Department (HOSPITAL_COMMUNITY)
Admission: EM | Admit: 2018-07-07 | Discharge: 2018-07-07 | Disposition: A | Discharge status: Home / Self Care | Payer: Self-pay | Attending: Emergency Medicine | Admitting: Emergency Medicine

## 2018-07-07 ENCOUNTER — Emergency Department (HOSPITAL_COMMUNITY): Payer: Self-pay

## 2018-07-07 ENCOUNTER — Other Ambulatory Visit: Payer: Self-pay

## 2018-07-07 ENCOUNTER — Encounter (HOSPITAL_COMMUNITY): Payer: Self-pay | Admitting: Emergency Medicine

## 2018-07-07 DIAGNOSIS — R109 Unspecified abdominal pain: Secondary | ICD-10-CM | POA: Insufficient documentation

## 2018-07-07 DIAGNOSIS — F17210 Nicotine dependence, cigarettes, uncomplicated: Secondary | ICD-10-CM | POA: Insufficient documentation

## 2018-07-07 LAB — CBC WITH DIFFERENTIAL/PLATELET
Abs Immature Granulocytes: 0.01 10*3/uL (ref 0.00–0.07)
BASOS PCT: 0 %
Basophils Absolute: 0 10*3/uL (ref 0.0–0.1)
EOS ABS: 0.1 10*3/uL (ref 0.0–0.5)
Eosinophils Relative: 2 %
HCT: 41.7 % (ref 36.0–46.0)
Hemoglobin: 13 g/dL (ref 12.0–15.0)
Immature Granulocytes: 0 %
Lymphocytes Relative: 44 %
Lymphs Abs: 3.9 10*3/uL (ref 0.7–4.0)
MCH: 28.1 pg (ref 26.0–34.0)
MCHC: 31.2 g/dL (ref 30.0–36.0)
MCV: 90.3 fL (ref 80.0–100.0)
Monocytes Absolute: 0.4 10*3/uL (ref 0.1–1.0)
Monocytes Relative: 5 %
NEUTROS PCT: 49 %
Neutro Abs: 4.4 10*3/uL (ref 1.7–7.7)
PLATELETS: 264 10*3/uL (ref 150–400)
RBC: 4.62 MIL/uL (ref 3.87–5.11)
RDW: 13.4 % (ref 11.5–15.5)
WBC: 8.8 10*3/uL (ref 4.0–10.5)
nRBC: 0 % (ref 0.0–0.2)

## 2018-07-07 LAB — COMPREHENSIVE METABOLIC PANEL
ALT: 15 U/L (ref 0–44)
AST: 19 U/L (ref 15–41)
Albumin: 3.4 g/dL — ABNORMAL LOW (ref 3.5–5.0)
Alkaline Phosphatase: 69 U/L (ref 38–126)
Anion gap: 7 (ref 5–15)
BUN: 9 mg/dL (ref 6–20)
CO2: 27 mmol/L (ref 22–32)
Calcium: 9.2 mg/dL (ref 8.9–10.3)
Chloride: 108 mmol/L (ref 98–111)
Creatinine, Ser: 0.9 mg/dL (ref 0.44–1.00)
GFR calc Af Amer: >60 mL/min (ref >60)
GFR calc non Af Amer: >60 mL/min (ref >60)
Glucose, Bld: 97 mg/dL (ref 70–99)
Potassium: 3.6 mmol/L (ref 3.5–5.1)
Sodium: 142 mmol/L (ref 135–145)
TOTAL PROTEIN: 6.5 g/dL (ref 6.5–8.1)
Total Bilirubin: 0.5 mg/dL (ref 0.3–1.2)

## 2018-07-07 LAB — URINALYSIS, ROUTINE W REFLEX MICROSCOPIC
BILIRUBIN URINE: NEGATIVE
Glucose, UA: NEGATIVE mg/dL
Hgb urine dipstick: NEGATIVE
Ketones, ur: NEGATIVE mg/dL
Leukocytes,Ua: NEGATIVE
Nitrite: NEGATIVE
Protein, ur: NEGATIVE mg/dL
Specific Gravity, Urine: 1.02 (ref 1.005–1.030)
pH: 6 (ref 5.0–8.0)

## 2018-07-07 LAB — LIPASE, BLOOD: Lipase: 27 U/L (ref 11–51)

## 2018-07-07 LAB — TROPONIN I: Troponin I: <0.03 ng/mL (ref <0.03)

## 2018-07-07 MED ORDER — KETOROLAC TROMETHAMINE 60 MG/2ML IM SOLN
60.0000 mg | Freq: Once | INTRAMUSCULAR | Status: AC
  Administered 2018-07-07: 60 mg via INTRAMUSCULAR
  Filled 2018-07-07: qty 2

## 2018-07-07 MED ORDER — HYDROCODONE-ACETAMINOPHEN 5-325 MG PO TABS: 2.0000 | ORAL_TABLET | Freq: Once | ORAL | Status: DC

## 2018-07-07 MED ORDER — KETOROLAC TROMETHAMINE 30 MG/ML IJ SOLN: 30.0000 mg | Freq: Once | INTRAMUSCULAR | Status: DC

## 2018-07-07 MED ORDER — HYDROCODONE-ACETAMINOPHEN 5-325 MG PO TABS: 1.0000 | ORAL_TABLET | Freq: Four times a day (QID) | ORAL | 0 refills | Status: DC | PRN

## 2018-07-07 NOTE — ED Provider Notes (Signed)
[facility] EMERGENCY DEPARTMENT Provider Note   CSN: [id number] Arrival date & time: 07/07/18  1141    History   Chief Complaint Chief Complaint  Patient presents with  . Back Pain    HPI Teresa Faulkner is a 53 y.o. female.     Patient is a 53 year old female with past medical history of GERD, arthritis, sciatica.  She presents today with complaints of pain in her left upper back.  This is been present for the past several days.  She denies any specific injury or trauma.  This pain is sharp in nature and worse when she moves or breathes.  She denies any shortness of breath, fever, or cough.  The history is provided by the patient.  Back Pain  Pain location: Left upper back. Quality:  Stabbing Radiates to:  Does not radiate Pain severity:  Moderate Onset quality:  Sudden Duration:  3 days Timing:  Constant Progression:  Worsening Chronicity:  New Relieved by:  Nothing Worsened by:  Movement and palpation   Past Medical History:  Diagnosis Date  . Anxiety   . Arthritis   . Bursitis   . GERD (gastroesophageal reflux disease)   . Hernia, hiatal   . Hx of trichomoniasis 2017  . Migraine     Patient Active Problem List   Diagnosis Date Noted  . Low back pain with left-sided sciatica 10/11/2016  . 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 Abdominal pain 05/22/2016  . Hiatal hernia 05/22/2016  . Chronic headaches 05/22/2016  . Menopause 05/22/2016    Past Surgical History:  Procedure Laterality Date  . BACK SURGERY    . TUBAL LIGATION       OB History    Gravida  2   Para  2   Term  2   Preterm  0   AB  0   Living  2     SAB  0   TAB  0   Ectopic  0   Multiple  0   Live Births               Home Medications    Prior to Admission medications   Medication Sig Start Date End Date Taking? Authorizing Provider  acetaminophen (TYLENOL) 500 MG tablet Take 1,000 mg by mouth every 6 (six) hours as needed for moderate pain or  headache.     [provider]  hydrOXYzine (ATARAX/VISTARIL) 25 MG tablet Take 1 tablet (25 mg total) by mouth every 6 (six) hours. 01/16/17   Liberty Handy, PA-C  naproxen (NAPROSYN) 375 MG tablet Take 1 tablet (375 mg total) by mouth 2 (two) times daily. 08/21/17   Felicie Morn, NP  predniSONE (STERAPRED UNI-PAK 21 TAB) 10 MG (21) TBPK tablet Take by mouth daily. Take 6 tabs by mouth daily  for 2 days, then 5 tabs for 2 days, then 4 tabs for 2 days, then 3 tabs for 2 days, 2 tabs for 2 days, then 1 tab by mouth daily for 2 days 01/16/17   Liberty Handy, PA-C    Family History Family History  Problem Relation Age of Onset  . Diabetes Mother   . Diabetes Maternal Aunt   . Hypertension Maternal Aunt   . Breast cancer Other 35       Stage IV    Social History Social History   Tobacco Use  . Smoking status: Current Every Day Smoker    Packs/day: 0.25    Years:  20.00    Pack years: 5.00    Types: Cigarettes  . Smokeless tobacco: Never Used  Substance Use Topics  . Alcohol use: No  . Drug use: No     Allergies   Patient has no known allergies.   Review of Systems Review of Systems  Musculoskeletal: Positive for back pain.  All other systems reviewed and are negative.    Physical Exam Updated Vital Signs BP 126/65 (BP Location: Right Arm)   Pulse 76   Temp 98 F (36.7 C)   Resp 16   SpO2 98%   Physical Exam Vitals signs and nursing note reviewed.  Constitutional:      General: She is not in acute distress.    Appearance: She is well-developed. She is not diaphoretic.  HENT:     Head: Normocephalic and atraumatic.  Neck:     Musculoskeletal: Normal range of motion and neck supple.  Cardiovascular:     Rate and Rhythm: Normal rate and regular rhythm.     Heart sounds: No murmur. No friction rub. No gallop.   Pulmonary:     Effort: Pulmonary effort is normal. No respiratory distress.     Breath sounds: Normal breath sounds. No wheezing.      Comments: There is tenderness palpation over the left posterior lower ribs. Abdominal:     General: Bowel sounds are normal. There is no distension.     Palpations: Abdomen is soft.     Tenderness: There is no abdominal tenderness.  Musculoskeletal: Normal range of motion.  Skin:    General: Skin is warm and dry.  Neurological:     Mental Status: She is alert and oriented to person, place, and time.      ED Treatments / Results  Labs (all labs ordered are listed, but only abnormal results are displayed) Labs Reviewed  COMPREHENSIVE METABOLIC PANEL  LIPASE, BLOOD  CBC WITH DIFFERENTIAL/PLATELET  TROPONIN I  URINALYSIS, ROUTINE W REFLEX MICROSCOPIC    EKG None  Radiology No results found.  Procedures Procedures (including critical care time)  Medications Ordered in ED Medications  ketorolac (TORADOL) 30 MG/ML injection 30 mg (has no administration in time range)  HYDROcodone-acetaminophen (NORCO/VICODIN) 5-325 MG per tablet 2 tablet (has no administration in time range)     Initial Impression / Assessment and Plan / ED Course  I have reviewed the triage vital signs and the nursing notes.  Pertinent labs & imaging results that were available during my care of the patient were reviewed by me and considered in my medical decision making (see chart for details).  Patient presents with complaints of pain in her left posterior ribs.  This is been ongoing for several days and began in the absence of any injury or trauma.  Her physical examination reveals tenderness in this area, however no palpable abnormality.  Work-up shows normal EKG, negative troponin, clear chest x-ray, clear urine, and lipase and LFTs which are unremarkable.  I suspect a musculoskeletal etiology.  She will be treated with anti-inflammatory medications, pain medication, and is to follow-up with primary doctor if not improving.  Final Clinical Impressions(s) / ED Diagnoses   Final diagnoses:  None     ED Discharge Orders    None       Geoffery Lyons, MD 07/07/18 1350

## 2018-07-07 NOTE — ED Triage Notes (Signed)
lft sided back and flank pain x 3 days hurts to breath and move denies dysuria and injury

## 2018-07-07 NOTE — ED Notes (Signed)
Patient verbalizes understanding of discharge instructions. Opportunity for questioning and answers were provided. Armband removed by staff, pt discharged from ED. Ambulated out to lobby  

## 2018-07-07 NOTE — ED Notes (Signed)
Patient transported to X-ray 

## 2018-07-07 NOTE — Discharge Instructions (Addendum)
Ibuprofen 600 mg 3 times daily for the next 5 days.  Hydrocodone as prescribed as needed for pain not relieved with ibuprofen.  Follow-up with your primary doctor if symptoms or not improving in the next few days, and return to the ER if you develop worsening pain, difficulty breathing, or other new and concerning symptoms.

## 2018-11-20 ENCOUNTER — Ambulatory Visit: Payer: Self-pay | Admitting: Physician Assistant

## 2019-03-08 ENCOUNTER — Emergency Department (HOSPITAL_COMMUNITY)
Admission: EM | Admit: 2019-03-08 | Discharge: 2019-03-08 | Disposition: A | Discharge status: Home / Self Care | Payer: Self-pay | Attending: Emergency Medicine | Admitting: Emergency Medicine

## 2019-03-08 ENCOUNTER — Encounter (HOSPITAL_COMMUNITY): Payer: Self-pay | Admitting: Emergency Medicine

## 2019-03-08 ENCOUNTER — Other Ambulatory Visit: Payer: Self-pay

## 2019-03-08 ENCOUNTER — Emergency Department (HOSPITAL_COMMUNITY): Payer: Self-pay

## 2019-03-08 DIAGNOSIS — R1013 Epigastric pain: Secondary | ICD-10-CM | POA: Insufficient documentation

## 2019-03-08 DIAGNOSIS — N63 Unspecified lump in unspecified breast: Secondary | ICD-10-CM | POA: Insufficient documentation

## 2019-03-08 DIAGNOSIS — Z79899 Other long term (current) drug therapy: Secondary | ICD-10-CM | POA: Insufficient documentation

## 2019-03-08 DIAGNOSIS — F17210 Nicotine dependence, cigarettes, uncomplicated: Secondary | ICD-10-CM | POA: Insufficient documentation

## 2019-03-08 LAB — URINALYSIS, ROUTINE W REFLEX MICROSCOPIC
Bilirubin Urine: NEGATIVE
Glucose, UA: NEGATIVE mg/dL
Hgb urine dipstick: NEGATIVE
Ketones, ur: NEGATIVE mg/dL
Leukocytes,Ua: NEGATIVE
Nitrite: NEGATIVE
Protein, ur: NEGATIVE mg/dL
Specific Gravity, Urine: 1.033 — ABNORMAL HIGH (ref 1.005–1.030)
pH: 5 (ref 5.0–8.0)

## 2019-03-08 LAB — CBC
HCT: 44.3 % (ref 36.0–46.0)
Hemoglobin: 13.8 g/dL (ref 12.0–15.0)
MCH: 28.8 pg (ref 26.0–34.0)
MCHC: 31.2 g/dL (ref 30.0–36.0)
MCV: 92.3 fL (ref 80.0–100.0)
Platelets: 272 10*3/uL (ref 150–400)
RBC: 4.8 MIL/uL (ref 3.87–5.11)
RDW: 13.8 % (ref 11.5–15.5)
WBC: 12.7 10*3/uL — ABNORMAL HIGH (ref 4.0–10.5)
nRBC: 0 % (ref 0.0–0.2)

## 2019-03-08 LAB — COMPREHENSIVE METABOLIC PANEL
ALT: 15 U/L (ref 0–44)
AST: 19 U/L (ref 15–41)
Albumin: 3.6 g/dL (ref 3.5–5.0)
Alkaline Phosphatase: 65 U/L (ref 38–126)
Anion gap: 10 (ref 5–15)
BUN: 12 mg/dL (ref 6–20)
CO2: 24 mmol/L (ref 22–32)
Calcium: 9.1 mg/dL (ref 8.9–10.3)
Chloride: 108 mmol/L (ref 98–111)
Creatinine, Ser: 0.9 mg/dL (ref 0.44–1.00)
GFR calc Af Amer: >60 mL/min (ref >60)
GFR calc non Af Amer: >60 mL/min (ref >60)
Glucose, Bld: 109 mg/dL — ABNORMAL HIGH (ref 70–99)
Potassium: 3.9 mmol/L (ref 3.5–5.1)
Sodium: 142 mmol/L (ref 135–145)
Total Bilirubin: 0.5 mg/dL (ref 0.3–1.2)
Total Protein: 6.6 g/dL (ref 6.5–8.1)

## 2019-03-08 LAB — LIPASE, BLOOD: Lipase: 27 U/L (ref 11–51)

## 2019-03-08 LAB — I-STAT BETA HCG BLOOD, ED (MC, WL, AP ONLY): I-stat hCG, quantitative: <5 m[IU]/mL (ref <5)

## 2019-03-08 MED ORDER — SODIUM CHLORIDE 0.9 % IV BOLUS
1000.0000 mL | Freq: Once | INTRAVENOUS | Status: AC
  Administered 2019-03-08: 09:00:00 1000 mL via INTRAVENOUS

## 2019-03-08 MED ORDER — IOHEXOL 300 MG/ML  SOLN
100.0000 mL | Freq: Once | INTRAMUSCULAR | Status: AC | PRN
  Administered 2019-03-08: 100 mL via INTRAVENOUS

## 2019-03-08 MED ORDER — PANTOPRAZOLE SODIUM 40 MG IV SOLR
40.0000 mg | Freq: Once | INTRAVENOUS | Status: AC
  Administered 2019-03-08: 08:00:00 40 mg via INTRAVENOUS
  Filled 2019-03-08: qty 40

## 2019-03-08 MED ORDER — PANTOPRAZOLE SODIUM 20 MG PO TBEC: 20.0000 mg | DELAYED_RELEASE_TABLET | Freq: Every day | ORAL | 1 refills | Status: DC

## 2019-03-08 MED ORDER — HYDROMORPHONE HCL 1 MG/ML IJ SOLN
1.0000 mg | Freq: Once | INTRAMUSCULAR | Status: AC
  Administered 2019-03-08: 08:00:00 1 mg via INTRAVENOUS
  Filled 2019-03-08: qty 1

## 2019-03-08 MED ORDER — SODIUM CHLORIDE 0.9% FLUSH: 3.0000 mL | Freq: Once | INTRAVENOUS | Status: DC

## 2019-03-08 MED ORDER — ONDANSETRON HCL 4 MG/2ML IJ SOLN
4.0000 mg | Freq: Once | INTRAMUSCULAR | Status: AC
  Administered 2019-03-08: 4 mg via INTRAVENOUS
  Filled 2019-03-08: qty 2

## 2019-03-08 NOTE — ED Notes (Signed)
Patient verbalizes understanding of discharge instructions . Opportunity for questions and answers were provided . Armband removed by staff ,Pt discharged from ED. W/C  offered at D/C  and Declined W/C at D/C and was escorted to lobby by RN.  

## 2019-03-08 NOTE — ED Provider Notes (Signed)
[facility]   CSN: [id number] Arrival date & time: 03/08/19  0348     History   Chief Complaint Chief Complaint  Patient presents with  . Abdominal Pain    HPI Teresa Faulkner is a 53 y.o. female.     HPI  53 year old female, history of intermittent abdominal pain that she states has been going on for months, denies any prior abdominal surgical history including cholecystectomy, she has had a tubal ligation.  She reports that this pain seems to be more epigastric in location, seems to come on randomly throughout the day or night but seem to get particularly worse this evening approximately 5 hours prior to my exam.  The patient denies taking any medications other than as needed Pepto-Bismol which really does not help that much.  She has noted some abnormal stools with a mucus appearance to them, no blood, no urinary symptoms, no radiation of this pain to the back of the shoulders or the chest.  She denies any other symptoms, she has not been seen by family doctor or anyone else for this complaint prior to today.  She is still having the pain at this time, again it comes and goes lasting for seconds when it comes on.  She denies history of pancreatitis alcohol use or excessive NSAID use  Past Medical History:  Diagnosis Date  . Anxiety   . Arthritis   . Bursitis   . GERD (gastroesophageal reflux disease)   . Hernia, hiatal   . Hx of trichomoniasis 2017  . Migraine     Patient Active Problem List   Diagnosis Date Noted  . Low back pain with left-sided sciatica 10/11/2016  . 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 Abdominal pain 05/22/2016  . Hiatal hernia 05/22/2016  . Chronic headaches 05/22/2016  . Menopause 05/22/2016    Past Surgical History:  Procedure Laterality Date  . BACK SURGERY    . TUBAL LIGATION       OB History    Gravida  2   Para  2   Term  2   Preterm  0   AB  0   Living  2     SAB  0   TAB  0   Ectopic  0   Multiple  0   Live Births               Home Medications    Prior to Admission medications   Medication Sig Start Date End Date Taking? Authorizing Provider  acetaminophen (TYLENOL) 500 MG tablet Take 1,000 mg by mouth every 6 (six) hours as needed for moderate pain or headache.     [provider]  HYDROcodone-acetaminophen (NORCO) 5-325 MG tablet Take 1-2 tablets by mouth every 6 (six) hours as needed. 07/07/18   Geoffery Lyonselo, Douglas, MD  hydrOXYzine (ATARAX/VISTARIL) 25 MG tablet Take 1 tablet (25 mg total) by mouth every 6 (six) hours. 01/16/17   Liberty HandyGibbons, Claudia J, PA-C  naproxen (NAPROSYN) 375 MG tablet Take 1 tablet (375 mg total) by mouth 2 (two) times daily. 08/21/17   Felicie MornSmith, David, NP  pantoprazole (PROTONIX) 20 MG tablet Take 1 tablet (20 mg total) by mouth daily. 03/08/19   Eber HongMiller, Jonice Cerra, MD  predniSONE (STERAPRED UNI-PAK 21 TAB) 10 MG (21) TBPK tablet Take by mouth daily. Take 6 tabs by mouth daily  for 2 days, then 5 tabs for 2 days, then 4 tabs for 2 days, then 3 tabs  for 2 days, 2 tabs for 2 days, then 1 tab by mouth daily for 2 days 01/16/17   Liberty Handy, PA-C    Family History Family History  Problem Relation Age of Onset  . Diabetes Mother   . Diabetes Maternal Aunt   . Hypertension Maternal Aunt   . Breast cancer Other 35       Stage IV    Social History Social History   Tobacco Use  . Smoking status: Current Every Day Smoker    Packs/day: 0.25    Years: 20.00    Pack years: 5.00    Types: Cigarettes  . Smokeless tobacco: Never Used  Substance Use Topics  . Alcohol use: No  . Drug use: No     Allergies   Patient has no known allergies.   Review of Systems Review of Systems  All other systems reviewed and are negative.    Physical Exam Updated Vital Signs BP 133/76 (BP Location: Right Arm)   Pulse 86   Temp 97.7 F (36.5 C) (Oral)   Resp 16   SpO2 98%   Physical Exam Vitals signs and nursing note  reviewed.  Constitutional:      General: She is not in acute distress.    Appearance: She is well-developed.  HENT:     Head: Normocephalic and atraumatic.     Mouth/Throat:     Pharynx: No oropharyngeal exudate.  Eyes:     General: No scleral icterus.       Right eye: No discharge.        Left eye: No discharge.     Conjunctiva/sclera: Conjunctivae normal.     Pupils: Pupils are equal, round, and reactive to light.  Neck:     Musculoskeletal: Normal range of motion and neck supple.     Thyroid: No thyromegaly.     Vascular: No JVD.  Cardiovascular:     Rate and Rhythm: Normal rate and regular rhythm.     Heart sounds: Normal heart sounds. No murmur. No friction rub. No gallop.   Pulmonary:     Effort: Pulmonary effort is normal. No respiratory distress.     Breath sounds: Normal breath sounds. No wheezing or rales.  Abdominal:     General: Bowel sounds are normal. There is no distension.     Palpations: Abdomen is soft. There is no mass.     Tenderness: There is abdominal tenderness.     Comments: There is focal tenderness to palpation in the epigastrium without guarding, the patient does wince to epigastric and left upper quadrant tenderness.  There is no lower abdominal tenderness at all, no pain McBurney's point, no Murphy sign  Musculoskeletal: Normal range of motion.        General: No tenderness.  Lymphadenopathy:     Cervical: No cervical adenopathy.  Skin:    General: Skin is warm and dry.     Findings: No erythema or rash.  Neurological:     Mental Status: She is alert.     Coordination: Coordination normal.  Psychiatric:        Behavior: Behavior normal.      ED Treatments / Results  Labs (all labs ordered are listed, but only abnormal results are displayed) Labs Reviewed  COMPREHENSIVE METABOLIC PANEL - Abnormal; Notable for the following components:      Result Value   Glucose, Bld 109 (*)    All other components within normal limits  CBC - Abnormal;  Notable  for the following components:   WBC 12.7 (*)    All other components within normal limits  URINALYSIS, ROUTINE W REFLEX MICROSCOPIC - Abnormal; Notable for the following components:   APPearance HAZY (*)    Specific Gravity, Urine 1.033 (*)    All other components within normal limits  LIPASE, BLOOD  I-STAT BETA HCG BLOOD, ED ([facility], [facility], [facility] ONLY)    EKG None  Radiology Ct Abdomen Pelvis W Contrast  Result Date: 03/08/2019 CLINICAL DATA:  Acute mid abdominal pain with nausea since this morning. EXAM: CT ABDOMEN AND PELVIS WITH CONTRAST TECHNIQUE: Multidetector CT imaging of the abdomen and pelvis was performed using the standard protocol following bolus administration of intravenous contrast. CONTRAST:  156mL OMNIPAQUE IOHEXOL 300 MG/ML  SOLN COMPARISON:  05/03/2015 FINDINGS: Lower chest: Unremarkable. Hepatobiliary: No focal liver abnormality is seen. No gallstones, gallbladder wall thickening, or biliary dilatation. Pancreas: Unremarkable. No pancreatic ductal dilatation or surrounding inflammatory changes. Spleen: Normal in size without focal abnormality. Adrenals/Urinary Tract: Normal appearing adrenal glands. Small left renal cyst. Normal appearing right kidney, ureters and urinary bladder. Stomach/Bowel: Small hiatal hernia or patulous esophageal ampulla. Otherwise, unremarkable stomach, small bowel, colon and appendix. Vascular/Lymphatic: No significant vascular findings are present. No enlarged abdominal or pelvic lymph nodes. Reproductive: Uterus and bilateral adnexa are unremarkable. Other: 1 cm oval mass-like density in the inferior right breast on the 1st image, not included in its entirety. The included portion has some circumscribed and some indistinct margins and it is vertically oriented. This was not included on the previous examination. Musculoskeletal: Lumbar and lower thoracic spine degenerative changes. IMPRESSION: 1. No acute abnormality. 2. Small hiatal hernia or patulous  esophageal ampulla. 3. 1 cm oval mass-like density in the inferior right breast on the 1st image, not included in its entirety. This is suspicious for a mass. Recommend further evaluation with bilateral diagnostic mammography and possible breast ultrasound. Electronically Signed   By: Claudie Revering M.D.   On: 03/08/2019 09:39    Procedures Procedures (including critical care time)  Medications Ordered in ED Medications  sodium chloride flush (NS) 0.9 % injection 3 mL (has no administration in time range)  pantoprazole (PROTONIX) injection 40 mg (40 mg Intravenous Given 03/08/19 0822)  sodium chloride 0.9 % bolus 1,000 mL (0 mLs Intravenous Stopped 03/08/19 0958)  ondansetron (ZOFRAN) injection 4 mg (4 mg Intravenous Given 03/08/19 0815)  HYDROmorphone (DILAUDID) injection 1 mg (1 mg Intravenous Given 03/08/19 0823)  iohexol (OMNIPAQUE) 300 MG/ML solution 100 mL (100 mLs Intravenous Contrast Given 03/08/19 0910)     Initial Impression / Assessment and Plan / ED Course  I have reviewed the triage vital signs and the nursing notes.  Pertinent labs & imaging results that were available during my care of the patient were reviewed by me and considered in my medical decision making (see chart for details).        The patient is not jaundiced, she does have focal abdominal tenderness, the location and timing of this pain suggest that it is going to be either peptic ulcer disease or some type of intestinal colic however a CT scan to rule out other causes would be important.  Labs reviewed showing normal metabolic panel, normal lipase, not pregnant, leukocytosis just over 12,000, and a urinalysis showing a slightly elevated specific gravity but no signs of infection.  The patient is agreeable to the plan, she will get Protonix, fluids and some Dilaudid.  The patient is totally pain-free asking to eat,  CT scan reviewed, breast mass found, the patient was informed of this and given a copy of the CT  scan report, expressed understanding to the need for close follow-up.  Final Clinical Impressions(s) / ED Diagnoses   Final diagnoses:  Abdominal pain, epigastric  Breast mass    ED Discharge Orders         Ordered    pantoprazole (PROTONIX) 20 MG tablet  Daily     03/08/19 1016           Eber Hong, MD 03/08/19 1018

## 2019-03-08 NOTE — Discharge Instructions (Signed)
Your CT scan did not show any signs of abdominal problems suggesting that your symptoms are likely from a stomach ulcer or gastritis, please see the attached reading instructions. The CT scan did show that you have a mass in one of your breast that will need further evaluation with mammogram, contact your gynecologist or your family doctor immediately to make a follow-up appointment for this week Please start Protonix daily

## 2019-03-08 NOTE — ED Triage Notes (Signed)
Patient reports mid abdominal pain with nausea onset this morning , no emesis or diarrhea , denies fever or chills .

## 2019-03-08 NOTE — ED Notes (Signed)
Got patient undress on the monitor patient is resting with call bell in reach 

## 2019-05-20 ENCOUNTER — Other Ambulatory Visit: Payer: Self-pay | Admitting: Pulmonary Disease

## 2019-05-20 DIAGNOSIS — N63 Unspecified lump in unspecified breast: Secondary | ICD-10-CM

## 2019-05-28 ENCOUNTER — Encounter: Payer: Self-pay | Admitting: Nurse Practitioner

## 2019-05-28 ENCOUNTER — Other Ambulatory Visit: Payer: Self-pay

## 2019-05-28 ENCOUNTER — Ambulatory Visit (INDEPENDENT_AMBULATORY_CARE_PROVIDER_SITE_OTHER): Payer: 59 | Admitting: Nurse Practitioner

## 2019-05-28 VITALS — BP 112/72 | HR 71 | Temp 97.9°F | Ht 67.6 in | Wt 214.8 lb

## 2019-05-28 DIAGNOSIS — K219 Gastro-esophageal reflux disease without esophagitis: Secondary | ICD-10-CM | POA: Diagnosis not present

## 2019-05-28 DIAGNOSIS — Z13228 Encounter for screening for other metabolic disorders: Secondary | ICD-10-CM

## 2019-05-28 DIAGNOSIS — Z720 Tobacco use: Secondary | ICD-10-CM

## 2019-05-28 DIAGNOSIS — R1010 Upper abdominal pain, unspecified: Secondary | ICD-10-CM | POA: Diagnosis not present

## 2019-05-28 DIAGNOSIS — R198 Other specified symptoms and signs involving the digestive system and abdomen: Secondary | ICD-10-CM | POA: Insufficient documentation

## 2019-05-28 MED ORDER — PANTOPRAZOLE SODIUM 20 MG PO TBEC: 20.0000 mg | DELAYED_RELEASE_TABLET | Freq: Every day | ORAL | 1 refills | Status: DC

## 2019-05-28 MED ORDER — BUPROPION HCL ER (SR) 150 MG PO TB12: 150.0000 mg | ORAL_TABLET | Freq: Every day | ORAL | 2 refills | Status: DC

## 2019-05-28 NOTE — Progress Notes (Signed)
This visit occurred during the SARS-CoV-2 public health emergency.  Safety protocols were in place, including screening questions prior to the visit, additional usage of staff PPE, and extensive cleaning of exam room while observing appropriate contact time as indicated for disinfecting solutions.  Subjective:     Patient ID: Teresa Faulkner , female    DOB: February 18, 1966 , 54 y.o.   MRN: [id number]   Chief Complaint  Patient presents with  . Establish Care  . Hernia    patient has been having some abdominal pain worsening over the year    HPI  Here to establish care, she found [place] on her insurance and wanted to see a female provider.  She is scheduled for a mammogram on 06/04/2019.    She had been seeing Dr. Katherine Roan, she is working as a PCA. Single. Children - 2 healthy.    PMH - back surgery in 2007 or 2008, tubal ligation. Right knee shifting.    She had been seen by Dr. Jenna Luo (hip shifting).  Dr. Melvyn Novas Bean performed her lumbar surgery.   [place] - mother-diabetic, niece deceased breast cancer (77 y/o). Niece deceased (unknown cause).    Smoker - 5-6 cigarettes.  She feels like it is worse.  She has done nicorette without relief.  Would like to quit.   LMP - more than one year.   Was going to [place] at the [facility]  She will have episodes of hot and cold and feel like she needs to vomit and have a bowel movement  Abdominal Pain This is a chronic problem. The current episode started more than 1 year ago (worse a couple months ago). The problem occurs constantly. The problem has been gradually worsening. The pain is located in the generalized abdominal region. The pain is at a severity of 10/10 (now pain is a 3). The patient is experiencing no pain. The quality of the pain is aching. Associated symptoms include nausea (in morning). Pertinent negatives include no constipation, fever or headaches. Associated symptoms comments: bm with mucous. She has tried H2 blockers  for the symptoms. Her past medical history is significant for GERD.     Past Medical History:  Diagnosis Date  . Anxiety   . Arthritis   . Bursitis   . GERD (gastroesophageal reflux disease)   . Hernia, hiatal   . Hx of trichomoniasis 2017  . Migraine      Family History  Problem Relation Age of Onset  . Diabetes Mother   . Diabetes Maternal Aunt   . Hypertension Maternal Aunt   . Breast cancer Other 35       Stage IV     Current Outpatient Medications:  .  pantoprazole (PROTONIX) 20 MG tablet, Take 1 tablet (20 mg total) by mouth daily., Disp: 30 tablet, Rfl: 1   No Known Allergies   Review of Systems  Constitutional: Negative for fatigue and fever.  Respiratory: Negative.   Cardiovascular: Negative.  Negative for chest pain, palpitations and leg swelling.  Gastrointestinal: Positive for abdominal pain and nausea (in morning). Negative for constipation.  Neurological: Negative for dizziness and headaches.     Today's Vitals   05/28/19 1440  BP: 112/72  Pulse: 71  Temp: 97.9 F (36.6 C)  TempSrc: Oral  Weight: 214 lb 12.8 oz (97.4 kg)  Height: 5' 7.6" (1.717 m)  PainSc: 0-No pain   Body mass index is 33.05 kg/m.   Objective:  Physical Exam Constitutional:  General: She is not in acute distress.    Appearance: Normal appearance.  Cardiovascular:     Rate and Rhythm: Normal rate and regular rhythm.     Pulses: Normal pulses.     Heart sounds: Normal heart sounds. No murmur.  Pulmonary:     Effort: Pulmonary effort is normal. No respiratory distress.     Breath sounds: Normal breath sounds.  Abdominal:     General: Bowel sounds are normal. There is distension.     Tenderness: There is abdominal tenderness (upper mid abdomen pain).  Neurological:     General: No focal deficit present.     Mental Status: She is alert and oriented to person, place, and time.  Psychiatric:        Mood and Affect: Mood normal.        Behavior: Behavior normal.         Thought Content: Thought content normal.        Judgment: Judgment normal.     Plan/Assessment: 1. Gastroesophageal reflux disease without esophagitis  This is the first time I am seeing her in this office, she reports persistent reflux and abdominal pain  Will refer to GI for further evaluation - pantoprazole (PROTONIX) 20 MG tablet; Take 1 tablet (20 mg total) by mouth daily.  Dispense: 30 tablet; Refill: 1 - Ambulatory referral to Gastroenterology  2. Pain of upper abdomen  Tenderness to upper abdomen with a slight bulge - Ambulatory referral to Gastroenterology  3. Change in bowel movement  Will refer to GI due to the bowel change and having mucous at times in stool - Ambulatory referral to Gastroenterology - CMP14+EGFR  4. Encounter for screening for metabolic disorder  - Hemoglobin A1c - Lipid panel  5. Tobacco abuse  Ready to quit: Yes  Counseling given: Yes  Smoking cessation instruction/counseling given:  counseled patient on the dangers of tobacco use, advised patient to stop smoking, and reviewed strategies to maximize success    - CT CHEST LUNG CA SCREEN LOW DOSE W/O CM; Future  - buPROPion (WELLBUTRIN SR) 150 MG 12 hr tablet; Take 1 tablet (150 mg total) by mouth daily.  Dispense: 30 tablet; Refill: 2    [name], FNP     THE PATIENT IS ENCOURAGED TO PRACTICE SOCIAL DISTANCING DUE TO THE COVID-19 PANDEMIC.

## 2019-05-28 NOTE — Patient Instructions (Signed)
Bupropion sustained-release tablets (smoking cessation) What is this medicine? BUPROPION (byoo PROE pee on) is used to help people quit smoking. This medicine may be used for other purposes; ask your health care provider or pharmacist if you have questions. COMMON BRAND NAME(S): Buproban, Zyban What should I tell my health care provider before I take this medicine? They need to know if you have any of these conditions:  an eating disorder, such as anorexia or bulimia  bipolar disorder or psychosis  diabetes or high blood sugar, treated with medication  glaucoma  head injury or brain tumor  heart disease, previous heart attack, or irregular heart beat  high blood pressure  kidney or liver disease  seizures  suicidal thoughts or a previous suicide attempt  Tourette's syndrome  weight loss  an unusual or allergic reaction to bupropion, other medicines, foods, dyes, or preservatives  breast-feeding  pregnant or trying to become pregnant How should I use this medicine? Take this medicine by mouth with a glass of water. Follow the directions on the prescription label. You can take it with or without food. If it upsets your stomach, take it with food. Do not cut, crush or chew this medicine. Take your medicine at regular intervals. If you take this medicine more than once a day, take your second dose at least 8 hours after you take your first dose. To limit difficulty in sleeping, avoid taking this medicine at bedtime. Do not take your medicine more often than directed. Do not stop taking this medicine suddenly except upon the advice of your doctor. Stopping this medicine too quickly may cause serious side effects. A special MedGuide will be given to you by the pharmacist with each prescription and refill. Be sure to read this information carefully each time. Talk to your pediatrician regarding the use of this medicine in children. Special care may be needed. Overdosage: If you  think you have taken too much of this medicine contact a poison control center or emergency room at once. NOTE: This medicine is only for you. Do not share this medicine with others. What if I miss a dose? If you miss a dose, skip the missed dose and take your next tablet at the regular time. There should be at least 8 hours between doses. Do not take double or extra doses. What may interact with this medicine? Do not take this medicine with any of the following medications:  linezolid  MAOIs like Azilect, Carbex, Eldepryl, Marplan, Nardil, and Parnate  methylene blue (injected into a vein)  other medicines that contain bupropion like Wellbutrin This medicine may also interact with the following medications:  alcohol  certain medicines for anxiety or sleep  certain medicines for blood pressure like metoprolol, propranolol  certain medicines for depression or psychotic disturbances  certain medicines for HIV or AIDS like efavirenz, lopinavir, nelfinavir, ritonavir  certain medicines for irregular heart beat like propafenone, flecainide  certain medicines for Parkinson's disease like amantadine, levodopa  certain medicines for seizures like carbamazepine, phenytoin, phenobarbital  cimetidine  clopidogrel  cyclophosphamide  digoxin  furazolidone  isoniazid  nicotine  orphenadrine  procarbazine  steroid medicines like prednisone or cortisone  stimulant medicines for attention disorders, weight loss, or to stay awake  tamoxifen  theophylline  thiotepa  ticlopidine  tramadol  warfarin This list may not describe all possible interactions. Give your health care provider a list of all the medicines, herbs, non-prescription drugs, or dietary supplements you use. Also tell them if you smoke,   drink alcohol, or use illegal drugs. Some items may interact with your medicine. What should I watch for while using this medicine? Visit your doctor or healthcare provider  for regular checks on your progress. This medicine should be used together with a patient support program. It is important to participate in a behavioral program, counseling, or other support program that is recommended by your healthcare provider. This medicine may cause serious skin reactions. They can happen weeks to months after starting the medicine. Contact your healthcare provider right away if you notice fevers or flu-like symptoms with a rash. The rash may be red or purple and then turn into blisters or peeling of the skin. Or, you might notice a red rash with swelling of the face, lips or lymph nodes in your neck or under your arms. Patients and their families should watch out for new or worsening thoughts of suicide or depression. Also watch out for sudden changes in feelings such as feeling anxious, agitated, panicky, irritable, hostile, aggressive, impulsive, severely restless, overly excited and hyperactive, or not being able to sleep. If this happens, especially at the beginning of treatment or after a change in dose, call your healthcare provider. Avoid alcoholic drinks while taking this medicine. Drinking excessive alcoholic beverages, using sleeping or anxiety medicines, or quickly stopping the use of these agents while taking this medicine may increase your risk for a seizure. Do not drive or use heavy machinery until you know how this medicine affects you. This medicine can impair your ability to perform these tasks. Do not take this medicine close to bedtime. It may prevent you from sleeping. Your mouth may get dry. Chewing sugarless gum or sucking hard candy, and drinking plenty of water may help. Contact your doctor if the problem does not go away or is severe. Do not use nicotine patches or chewing gum without the advice of your doctor or healthcare provider while taking this medicine. You may need to have your blood pressure taken regularly if your doctor recommends that you use both  nicotine and this medicine together. What side effects may I notice from receiving this medicine? Side effects that you should report to your doctor or health care professional as soon as possible:  allergic reactions like skin rash, itching or hives, swelling of the face, lips, or tongue  breathing problems  changes in vision  confusion  elevated mood, decreased need for sleep, racing thoughts, impulsive behavior  fast or irregular heartbeat  hallucinations, loss of contact with reality  increased blood pressure  rash, fever, and swollen lymph nodes  redness, blistering, peeling, or loosening of the skin, including inside the mouth  seizures  suicidal thoughts or other mood changes  unusually weak or tired  vomiting Side effects that usually do not require medical attention (report to your doctor or health care professional if they continue or are bothersome):  constipation  headache  loss of appetite  nausea  tremors  weight loss This list may not describe all possible side effects. Call your doctor for medical advice about side effects. You may report side effects to FDA at 1-800-FDA-1088. Where should I keep my medicine? Keep out of the reach of children. Store at room temperature between 20 and 25 degrees C (68 and 77 degrees F). Protect from light. Keep container tightly closed. Throw away any unused medicine after the expiration date. NOTE: This sheet is a summary. It may not cover all possible information. If you have questions about this medicine,   talk to your doctor, pharmacist, or health care provider.  2020 Elsevier/Gold Standard (2018-07-18 13:59:09) Tobacco Use Disorder Tobacco use disorder (TUD) occurs when a person craves, seeks, and uses tobacco, regardless of the consequences. This disorder can cause problems with mental and physical health. It can affect your ability to have healthy relationships, and it can keep you from meeting your  responsibilities at work, home, or school. Tobacco may be:  Smoked as a cigarette or cigar.  Inhaled using e-cigarettes.  Smoked in a pipe or hookah.  Chewed as smokeless tobacco.  Inhaled into the nostrils as snuff. Tobacco products contain a dangerous chemical called nicotine, which is very addictive. Nicotine triggers hormones that make the body feel stimulated and works on areas of the brain that make you feel good. These effects can make it hard for people to quit nicotine. Tobacco contains many other unsafe chemicals that can damage almost every organ in the body. Smoking tobacco also puts others in danger due to fire risk and possible health problems caused by breathing in secondhand smoke. What are the signs or symptoms? Symptoms of TUD may include:  Being unable to slow down or stop your tobacco use.  Spending an abnormal amount of time getting or using tobacco.  Craving tobacco. Cravings may last for up to 6 months after quitting.  Tobacco use that: ? Interferes with your work, school, or home life. ? Interferes with your personal and social relationships. ? Makes you give up activities that you once enjoyed or found important.  Using tobacco even though you know that it is: ? Dangerous or bad for your health or someone else's health. ? Causing problems in your life.  Needing more and more of the substance to get the same effect (developing tolerance).  Experiencing unpleasant symptoms if you do not use the substance (withdrawal). Withdrawal symptoms may include: ? Depressed, anxious, or irritable mood. ? Difficulty concentrating. ? Increased appetite. ? Restlessness or trouble sleeping.  Using the substance to avoid withdrawal. How is this diagnosed? This condition may be diagnosed based on:  Your current and past tobacco use. Your health care provider may ask questions about how your tobacco use affects your life.  A physical exam. You may be diagnosed with  TUD if you have at least two symptoms within a 4-month period. How is this treated? This condition is treated by stopping tobacco use. Many people are unable to quit on their own and need help. Treatment may include:  Nicotine replacement therapy (NRT). NRT provides nicotine without the other harmful chemicals in tobacco. NRT gradually lowers the dosage of nicotine in the body and reduces withdrawal symptoms. NRT is available as: ? Over-the-counter gums, lozenges, and skin patches. ? Prescription mouth inhalers and nasal sprays.  Medicine that acts on the brain to reduce cravings and withdrawal symptoms.  A type of talk therapy that examines your triggers for tobacco use, how to avoid them, and how to cope with cravings (behavioral therapy).  Hypnosis. This may help with withdrawal symptoms.  Joining a support group for others coping with TUD. The best treatment for TUD is usually a combination of medicine, talk therapy, and support groups. Recovery can be a long process. Many people start using tobacco again after stopping (relapse). If you relapse, it does not mean that treatment will not work. Follow these instructions at home:  Lifestyle  Do not use any products that contain nicotine or tobacco, such as cigarettes and e-cigarettes.  Avoid things that trigger  tobacco use as much as you can. Triggers include people and situations that usually cause you to use tobacco.  Avoid drinks that contain caffeine, including coffee. These may worsen some withdrawal symptoms.  Find ways to manage stress. Wanting to smoke may cause stress, and stress can make you want to smoke. Relaxation techniques such as deep breathing, meditation, and yoga may help.  Attend support groups as needed. These groups are an important part of long-term recovery for many people. General instructions  Take over-the-counter and prescription medicines only as told by your health care provider.  Check with your  health care provider before taking any new prescription or over-the-counter medicines.  Decide on a friend, family member, or smoking quit-line (such as 1-800-QUIT-NOW in the [place] that you can call or text when you feel the urge to smoke or when you need help coping with cravings.  Keep all follow-up visits as told by your health care provider and therapist. This is important. Contact a health care provider if:  You are not able to take your medicines as prescribed.  Your symptoms get worse, even with treatment. Summary  Tobacco use disorder (TUD) occurs when a person craves, seeks, and uses tobacco regardless of the consequences.  This condition may be diagnosed based on your current and past tobacco use and a physical exam.  Many people are unable to quit on their own and need help. Recovery can be a long process.  The most effective treatment for TUD is usually a combination of medicine, talk therapy, and support groups. This information is not intended to replace advice given to you by your health care provider. Make sure you discuss any questions you have with your health care provider. Document Revised: 04/11/2017 Document Reviewed: 04/11/2017 Elsevier Patient Education  2020 [employer].

## 2019-05-29 LAB — HEMOGLOBIN A1C
Est. average glucose Bld gHb Est-mCnc: 114 mg/dL
Hgb A1c MFr Bld: 5.6 % (ref 4.8–5.6)

## 2019-05-29 LAB — LIPID PANEL
Chol/HDL Ratio: 3.7 ratio (ref 0.0–4.4)
Cholesterol, Total: 168 mg/dL (ref 100–199)
HDL: 46 mg/dL (ref >39)
LDL Chol Calc (NIH): 99 mg/dL (ref 0–99)
Triglycerides: 130 mg/dL (ref 0–149)
VLDL Cholesterol Cal: 23 mg/dL (ref 5–40)

## 2019-05-29 LAB — CMP14+EGFR
ALT: 12 IU/L (ref 0–32)
AST: 18 IU/L (ref 0–40)
Albumin/Globulin Ratio: 1.7 (ref 1.2–2.2)
Albumin: 4.2 g/dL (ref 3.8–4.9)
Alkaline Phosphatase: 85 IU/L (ref 39–117)
BUN/Creatinine Ratio: 12 (ref 9–23)
BUN: 9 mg/dL (ref 6–24)
Bilirubin Total: <0.2 mg/dL (ref 0.0–1.2)
CO2: 28 mmol/L (ref 20–29)
Calcium: 9.6 mg/dL (ref 8.7–10.2)
Chloride: 105 mmol/L (ref 96–106)
Creatinine, Ser: 0.78 mg/dL (ref 0.57–1.00)
GFR calc Af Amer: 101 mL/min/{1.73_m2} (ref >59)
GFR calc non Af Amer: 88 mL/min/{1.73_m2} (ref >59)
Globulin, Total: 2.5 g/dL (ref 1.5–4.5)
Glucose: 89 mg/dL (ref 65–99)
Potassium: 4.3 mmol/L (ref 3.5–5.2)
Sodium: 144 mmol/L (ref 134–144)
Total Protein: 6.7 g/dL (ref 6.0–8.5)

## 2019-06-03 ENCOUNTER — Other Ambulatory Visit: Payer: Self-pay | Admitting: Nurse Practitioner

## 2019-06-03 DIAGNOSIS — N63 Unspecified lump in unspecified breast: Secondary | ICD-10-CM

## 2019-06-04 ENCOUNTER — Ambulatory Visit
Admission: RE | Admit: 2019-06-04 | Discharge: 2019-06-04 | Disposition: A | Discharge status: Home / Self Care | Payer: 59 | Source: Ambulatory Visit | Attending: Pulmonary Disease | Admitting: Pulmonary Disease

## 2019-06-04 ENCOUNTER — Other Ambulatory Visit: Payer: Self-pay

## 2019-06-04 DIAGNOSIS — N63 Unspecified lump in unspecified breast: Secondary | ICD-10-CM

## 2019-06-18 ENCOUNTER — Ambulatory Visit: Payer: Self-pay | Admitting: Nurse Practitioner

## 2019-06-24 ENCOUNTER — Encounter: Payer: Self-pay | Admitting: Nurse Practitioner

## 2019-06-25 ENCOUNTER — Ambulatory Visit (INDEPENDENT_AMBULATORY_CARE_PROVIDER_SITE_OTHER): Payer: 59 | Admitting: Nurse Practitioner

## 2019-06-25 ENCOUNTER — Encounter: Payer: Self-pay | Admitting: Nurse Practitioner

## 2019-06-25 ENCOUNTER — Other Ambulatory Visit: Payer: Self-pay

## 2019-06-25 VITALS — BP 118/76 | HR 82 | Temp 98.2°F | Ht 67.6 in | Wt 215.0 lb

## 2019-06-25 DIAGNOSIS — Z113 Encounter for screening for infections with a predominantly sexual mode of transmission: Secondary | ICD-10-CM

## 2019-06-25 DIAGNOSIS — Z720 Tobacco use: Secondary | ICD-10-CM

## 2019-06-25 DIAGNOSIS — R1013 Epigastric pain: Secondary | ICD-10-CM

## 2019-06-25 DIAGNOSIS — R350 Frequency of micturition: Secondary | ICD-10-CM | POA: Diagnosis not present

## 2019-06-25 DIAGNOSIS — Z1211 Encounter for screening for malignant neoplasm of colon: Secondary | ICD-10-CM | POA: Diagnosis not present

## 2019-06-25 LAB — POCT URINALYSIS DIPSTICK
Bilirubin, UA: NEGATIVE
Blood, UA: NEGATIVE
Glucose, UA: NEGATIVE
Ketones, UA: NEGATIVE
Leukocytes, UA: NEGATIVE
Nitrite, UA: NEGATIVE
Protein, UA: NEGATIVE
Spec Grav, UA: 1.01 (ref 1.010–1.025)
Urobilinogen, UA: 0.2 E.U./dL
pH, UA: 5.5 (ref 5.0–8.0)

## 2019-06-25 NOTE — Progress Notes (Signed)
This visit occurred during the SARS-CoV-2 public health emergency.  Safety protocols were in place, including screening questions prior to the visit, additional usage of staff PPE, and extensive cleaning of exam room while observing appropriate contact time as indicated for disinfecting solutions.  Subjective:     Patient ID: Teresa Faulkner , female    DOB: 12/10/1965 , 54 y.o.   MRN: [id number]   Chief Complaint  Patient presents with  . Constipation    Not able to move bowls when she do its lose, discuss medication    HPI  Reports last Thursday was severe pain with the urge to have a bowel movement.  Weak and nauseated.  When she went home laid down for the whole. No medications.  She has not had a colonoscopy yet.  She is also due for her PAP.    Reports having a history of a hernia  Constipation This is a chronic problem. The current episode started more than 1 month ago (6 months). Her stool frequency is 1 time per day. The patient is not on a high fiber diet. She does not exercise regularly. There has not been adequate water intake (1/2 bottle of water daily). Associated symptoms include abdominal pain. Pertinent negatives include no bloating, diarrhea, nausea or vomiting. Associated symptoms comments: Soft stool. Risk factors include obesity. There is no history of abdominal surgery.     Past Medical History:  Diagnosis Date  . Anxiety   . Arthritis   . Bursitis   . GERD (gastroesophageal reflux disease)   . Hernia, hiatal   . Hx of trichomoniasis 2017  . Migraine      Family History  Problem Relation Age of Onset  . Diabetes Mother   . Diabetes Maternal Aunt   . Hypertension Maternal Aunt   . Breast cancer Other 35       Stage IV     Current Outpatient Medications:  .  pantoprazole (PROTONIX) 20 MG tablet, Take 1 tablet (20 mg total) by mouth daily., Disp: 30 tablet, Rfl: 1 .  buPROPion (WELLBUTRIN SR) 150 MG 12 hr tablet, Take 1 tablet (150 mg total) by mouth  daily. (Patient not taking: Reported on 06/25/2019), Disp: 30 tablet, Rfl: 2   No Known Allergies   Review of Systems  Constitutional: Negative.   Respiratory: Negative.   Cardiovascular: Negative.  Negative for chest pain, palpitations and leg swelling.  Gastrointestinal: Positive for abdominal pain and constipation. Negative for bloating, diarrhea, nausea and vomiting.  Endocrine: Negative for polydipsia, polyphagia and polyuria.  Neurological: Negative for dizziness and headaches.  Psychiatric/Behavioral: Negative.      Today's Vitals   06/25/19 1128  BP: 118/76  Pulse: 82  Temp: 98.2 F (36.8 C)  TempSrc: Oral  Weight: 215 lb (97.5 kg)  Height: 5' 7.6" (1.717 m)   Body mass index is 33.08 kg/m.   Objective:  Physical Exam Vitals reviewed.  Constitutional:      Appearance: Normal appearance.  Cardiovascular:     Rate and Rhythm: Normal rate and regular rhythm.     Pulses: Normal pulses.     Heart sounds: Normal heart sounds. No murmur.  Pulmonary:     Effort: Pulmonary effort is normal.     Breath sounds: Normal breath sounds.  Abdominal:     General: Bowel sounds are normal. There is no distension.     Palpations: Abdomen is soft.     Tenderness: There is no abdominal tenderness.       Hernia: A hernia (reported history of hernia) is present.  Skin:    Capillary Refill: Capillary refill takes less than 2 seconds.  Neurological:     General: No focal deficit present.     Mental Status: She is alert and oriented to person, place, and time.         Assessment And Plan:     1. Epigastric pain  Will check for H pylori  Encouraged to take prilosec daily  Pending labs will continue for longer period of time - CMP14+EGFR - Lipid panel - H Pylori, IGM, IGG, IGA AB  2. Urinary frequency  Urinalysis is normal  Will check for diabetes - POCT Urinalysis Dipstick (81002) - Hemoglobin A1c  3. Tobacco abuse Smoking cessation instruction/counseling given:   counseled patient on the dangers of tobacco use, advised patient to stop smoking, and reviewed strategies to maximize success  4. Encounter for screening colonoscopy  According to USPTF Colorectal cancer Screening guidelines. Colonoscopy is recommended every 10 years, starting at age 73years.  Will refer to GI for colon cancer screening.  She has never had a colonoscopy done - Ambulatory referral to Gastroenterology  5. Screening examination for STD (sexually transmitted disease)  - HIV antibody (with reflex)   [name], FNP    THE PATIENT IS ENCOURAGED TO PRACTICE SOCIAL DISTANCING DUE TO THE COVID-19 PANDEMIC.

## 2019-06-26 ENCOUNTER — Ambulatory Visit: Payer: 59 | Admitting: Nurse Practitioner

## 2019-06-26 LAB — CMP14+EGFR
ALT: 15 IU/L (ref 0–32)
AST: 8 IU/L (ref 0–40)
Albumin/Globulin Ratio: 1.6 (ref 1.2–2.2)
Albumin: 4.3 g/dL (ref 3.8–4.9)
Alkaline Phosphatase: 84 IU/L (ref 39–117)
BUN/Creatinine Ratio: 11 (ref 9–23)
BUN: 9 mg/dL (ref 6–24)
Bilirubin Total: 0.2 mg/dL (ref 0.0–1.2)
CO2: 24 mmol/L (ref 20–29)
Calcium: 9.7 mg/dL (ref 8.7–10.2)
Chloride: 105 mmol/L (ref 96–106)
Creatinine, Ser: 0.83 mg/dL (ref 0.57–1.00)
GFR calc Af Amer: 94 mL/min/{1.73_m2} (ref >59)
GFR calc non Af Amer: 81 mL/min/{1.73_m2} (ref >59)
Globulin, Total: 2.7 g/dL (ref 1.5–4.5)
Glucose: 69 mg/dL (ref 65–99)
Potassium: 4.3 mmol/L (ref 3.5–5.2)
Sodium: 143 mmol/L (ref 134–144)
Total Protein: 7 g/dL (ref 6.0–8.5)

## 2019-06-26 LAB — H PYLORI, IGM, IGG, IGA AB
H pylori, IgM Abs: <9 units (ref 0.0–8.9)
H. pylori, IgA Abs: <9 units (ref 0.0–8.9)
H. pylori, IgG AbS: 3.59 Index Value — ABNORMAL HIGH (ref 0.00–0.79)

## 2019-06-26 LAB — HIV ANTIBODY (ROUTINE TESTING W REFLEX): HIV Screen 4th Generation wRfx: NONREACTIVE

## 2019-06-26 LAB — HEMOGLOBIN A1C
Est. average glucose Bld gHb Est-mCnc: 117 mg/dL
Hgb A1c MFr Bld: 5.7 % — ABNORMAL HIGH (ref 4.8–5.6)

## 2019-06-26 LAB — LIPID PANEL
Chol/HDL Ratio: 4.2 ratio (ref 0.0–4.4)
Cholesterol, Total: 181 mg/dL (ref 100–199)
HDL: 43 mg/dL (ref >39)
LDL Chol Calc (NIH): 110 mg/dL — ABNORMAL HIGH (ref 0–99)
Triglycerides: 160 mg/dL — ABNORMAL HIGH (ref 0–149)
VLDL Cholesterol Cal: 28 mg/dL (ref 5–40)

## 2019-06-29 ENCOUNTER — Encounter: Payer: Self-pay | Admitting: Nurse Practitioner

## 2019-06-30 ENCOUNTER — Other Ambulatory Visit: Payer: Self-pay | Admitting: Nurse Practitioner

## 2019-06-30 DIAGNOSIS — R768 Other specified abnormal immunological findings in serum: Secondary | ICD-10-CM

## 2019-06-30 MED ORDER — METRONIDAZOLE 500 MG PO TABS: 500.0000 mg | ORAL_TABLET | Freq: Three times a day (TID) | ORAL | 0 refills | Status: AC

## 2019-06-30 MED ORDER — AMOXICILLIN 875 MG PO TABS: 875.0000 mg | ORAL_TABLET | Freq: Two times a day (BID) | ORAL | 0 refills | Status: DC

## 2019-07-02 ENCOUNTER — Other Ambulatory Visit: Payer: Self-pay

## 2019-07-02 ENCOUNTER — Encounter: Payer: Self-pay | Admitting: Nurse Practitioner

## 2019-07-02 ENCOUNTER — Ambulatory Visit (INDEPENDENT_AMBULATORY_CARE_PROVIDER_SITE_OTHER): Payer: 59 | Admitting: Nurse Practitioner

## 2019-07-02 VITALS — BP 112/80 | HR 83 | Temp 98.3°F | Ht 67.2 in | Wt 218.0 lb

## 2019-07-02 DIAGNOSIS — M545 Low back pain: Secondary | ICD-10-CM | POA: Diagnosis not present

## 2019-07-02 DIAGNOSIS — R1013 Epigastric pain: Secondary | ICD-10-CM | POA: Diagnosis not present

## 2019-07-02 DIAGNOSIS — Z720 Tobacco use: Secondary | ICD-10-CM

## 2019-07-02 DIAGNOSIS — F17200 Nicotine dependence, unspecified, uncomplicated: Secondary | ICD-10-CM

## 2019-07-02 DIAGNOSIS — G8929 Other chronic pain: Secondary | ICD-10-CM

## 2019-07-02 NOTE — Progress Notes (Signed)
This visit occurred during the SARS-CoV-2 public health emergency.  Safety protocols were in place, including screening questions prior to the visit, additional usage of staff PPE, and extensive cleaning of exam room while observing appropriate contact time as indicated for disinfecting solutions.  Subjective:     Patient ID: Alcide Clever , female    DOB: 25-Jul-1965 , 54 y.o.   MRN: [id number]   Chief Complaint  Patient presents with  . MED CHECK    patient presents today for a med check on welbutrin she stated she can tell a difference     HPI  Here for follow up for smoking cessation, she is taking wellbutrin, she is smoking 2-3 cigarettes per day. Can tell a difference in her body.  She feels she has more energy and feels like her mood is better.    Her stomach is feeling better, has not started the medications.      Past Medical History:  Diagnosis Date  . Anxiety   . Arthritis   . Bursitis   . GERD (gastroesophageal reflux disease)   . Hernia, hiatal   . Hx of trichomoniasis 2017  . Migraine      Family History  Problem Relation Age of Onset  . Diabetes Mother   . Diabetes Maternal Aunt   . Hypertension Maternal Aunt   . Breast cancer Other 35       Stage IV     Current Outpatient Medications:  .  buPROPion (WELLBUTRIN SR) 150 MG 12 hr tablet, Take 1 tablet (150 mg total) by mouth daily., Disp: 30 tablet, Rfl: 2 .  metroNIDAZOLE (FLAGYL) 500 MG tablet, Take 1 tablet (500 mg total) by mouth 3 (three) times daily for 10 days., Disp: 30 tablet, Rfl: 0 .  pantoprazole (PROTONIX) 20 MG tablet, Take 1 tablet (20 mg total) by mouth daily., Disp: 30 tablet, Rfl: 1 .  amoxicillin (AMOXIL) 875 MG tablet, Take 1 tablet (875 mg total) by mouth 2 (two) times daily. (Patient not taking: Reported on 07/02/2019), Disp: 14 tablet, Rfl: 0   No Known Allergies   Review of Systems  Constitutional: Negative.   Respiratory: Negative.   Cardiovascular: Negative.  Negative for  chest pain, palpitations and leg swelling.  Neurological: Negative for dizziness and headaches.  Psychiatric/Behavioral: Negative.      Today's Vitals   07/02/19 1553  BP: 112/80  Pulse: 83  Temp: 98.3 F (36.8 C)  TempSrc: Oral  Weight: 218 lb (98.9 kg)  Height: 5' 7.2" (1.707 m)  PainSc: 2   PainLoc: Head   Body mass index is 33.94 kg/m.   Objective:  Physical Exam Constitutional:      General: She is not in acute distress.    Appearance: Normal appearance.  Pulmonary:     Effort: Pulmonary effort is normal. No respiratory distress.  Skin:    Capillary Refill: Capillary refill takes less than 2 seconds.  Neurological:     General: No focal deficit present.     Mental Status: She is alert and oriented to person, place, and time.  Psychiatric:        Mood and Affect: Mood normal.        Behavior: Behavior normal.        Thought Content: Thought content normal.        Judgment: Judgment normal.         Assessment And Plan:     1. Tobacco abuse  She is doing well with her  smoking she is down to 2 cigarettes per day  2. Epigastric pain  This is better her h pylori antibody IgG was elevated so I am treating her but she has not picked up the medication yet  3. Chronic bilateral low back pain without sciatica  She would like a referral to orthopedics due to reinjuring her back, has seen at [facility] in the past.  - Ambulatory referral to [facility], FNP    THE PATIENT IS ENCOURAGED TO PRACTICE SOCIAL DISTANCING DUE TO THE COVID-19 PANDEMIC.

## 2019-07-02 NOTE — Patient Instructions (Signed)

## 2019-07-16 ENCOUNTER — Ambulatory Visit: Payer: 59 | Admitting: Physician Assistant

## 2019-07-21 ENCOUNTER — Ambulatory Visit (INDEPENDENT_AMBULATORY_CARE_PROVIDER_SITE_OTHER): Payer: 59

## 2019-07-21 ENCOUNTER — Other Ambulatory Visit: Payer: Self-pay

## 2019-07-21 ENCOUNTER — Encounter: Payer: Self-pay | Admitting: Physician Assistant

## 2019-07-21 ENCOUNTER — Ambulatory Visit (INDEPENDENT_AMBULATORY_CARE_PROVIDER_SITE_OTHER): Payer: 59 | Admitting: Physician Assistant

## 2019-07-21 DIAGNOSIS — M545 Low back pain: Secondary | ICD-10-CM | POA: Diagnosis not present

## 2019-07-21 DIAGNOSIS — G8929 Other chronic pain: Secondary | ICD-10-CM | POA: Diagnosis not present

## 2019-07-21 DIAGNOSIS — M25561 Pain in right knee: Secondary | ICD-10-CM

## 2019-07-21 MED ORDER — METHYLPREDNISOLONE ACETATE 40 MG/ML IJ SUSP
40.0000 mg | INTRAMUSCULAR | Status: AC | PRN
  Administered 2019-07-21: 40 mg via INTRA_ARTICULAR

## 2019-07-21 MED ORDER — LIDOCAINE HCL 1 % IJ SOLN
5.0000 mL | INTRAMUSCULAR | Status: AC | PRN
  Administered 2019-07-21: 5 mL

## 2019-07-21 NOTE — Progress Notes (Signed)
Office Visit Note   Patient: Teresa Faulkner           Date of Birth: Feb 08, 1966           MRN: [id number] Visit Date: 07/21/2019              Requested by: [name], [place],  [place] [address] PCP: [name], FNP   Assessment & Plan: Visit Diagnoses:  1. Chronic right-sided low back pain, unspecified whether sciatica present   2. Right knee pain, unspecified chronicity     Plan: Discussed with patient treatment options.  Recommend MRI due to her change in pain worsening pain.  Reminded her that this would be for epidural steroid planning she is agreeable with this.  We will see her back after the MRI to go over results discuss further treatment.  Questions encouraged and answered.  In regards to her knee she will be mindful of any mechanical symptoms of the knee.  She may need further work-up of her knee if her pain persist or if she has recurrent effusion.  Follow-Up Instructions: Return After MRI.   Orders:  Orders Placed This Encounter  Procedures  . Large Joint Inj  . XR Lumbar Spine 2-3 Views  . XR Knee 1-2 Views Right   No orders of the defined types were placed in this encounter.     Procedures: Large Joint Inj on 07/21/2019 5:52 PM Indications: pain Details: 22 G 1.5 in needle, anterolateral approach  Arthrogram: No  Medications: 40 mg methylPREDNISolone acetate 40 MG/ML; 5 mL lidocaine 1 % Aspirate: 12 mL yellow and blood-tinged Outcome: tolerated well, no immediate complications Procedure, treatment alternatives, risks and benefits explained, specific risks discussed. Consent was given by the patient. Immediately prior to procedure a time out was called to verify the correct patient, procedure, equipment, support staff and site/side marked as required. Patient was prepped and draped in the usual sterile fashion.       Clinical Data: No additional findings.   Subjective: Chief Complaint  Patient presents with  .  Lower Back - Pain  . Right Knee - Pain    HPI Teresa Faulkner comes in today due to low back pain with radicular symptoms down the right leg.  She also is having right knee pain.  Patient for sports this since we saw her in 2018 that her back pain is became worse.  Is worse over the last few months.  Pain worse at night.  Radiates to the right knee at times.  She has pain whenever she is sitting or standing better when lying down.  She has tried Aleve.  She has tried stretching.  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  She has had no new injury to the back.  She denies any numbness tingling down either leg.  Denies any saddle anesthesia like symptoms or bowel bladder dysfunction.  Again she underwent previous back surgery by Dr. Tonita Cong in 2008 and had an L4-L5 right-sided lateral recess decompression and foraminotomy of L5. Right knee pain which is also been ongoing for the last few months.  She states she has giving way and painful popping in the knee.  No locking or catching.  Last 3 days she has noticed swelling in  the knee.  No known injury.  She did have 1 incident of sharp pain under the patella.  No known injury to the knee. Review of Systems Please see HPI otherwise negative noncontributory.  Objective: Vital Signs: There were no vitals taken for this visit.  Physical Exam Constitutional:      Appearance: She is not ill-appearing or diaphoretic.  Cardiovascular:     Pulses: Normal pulses.  Pulmonary:     Effort: Pulmonary effort is normal.  Neurological:     Mental Status: She is alert and oriented to person, place, and time.  Psychiatric:        Mood and Affect: Mood normal.     Ortho Exam Lower extremity she has 5 out of 5 strength throughout lower extremities against  resistance.  Positive straight leg raise on the right negative on the left.  Good range of motion bilateral hips without pain.  No tenderness over the trochanteric region of either hip. Bilateral knees good range of motion without pain.  Right knee with positive effusion.  Both knees without abnormal warmth erythema or ecchymosis.  Right knee tenderness along the lateral joint line.  Right knee positive McMurray's left knee negative McMurray's. Specialty Comments:  No specialty comments available.  Imaging: XR Knee 1-2 Views Right  Result Date: 07/21/2019 Right knee 2 views: Medial lateral joint lines well-maintained.  Moderate patellofemoral changes.  Slight effusion.  No bony abnormalities no acute fractures otherwise.  Knee is well located.  XR Lumbar Spine 2-3 Views  Result Date: 07/21/2019 Lumbar spine 2 view shows loss of lordotic curvature.  No acute fractures.  Anterior vertebral spurring at L4-5 with disc space narrowing at L4-5.  No spondylolisthesis.    PMFS History: Patient Active Problem List   Diagnosis Date Noted  . Urinary frequency 06/25/2019  . Change in bowel movement 05/28/2019  . Gastroesophageal reflux disease without esophagitis 05/28/2019  . Encounter for screening for metabolic disorder 05/28/2019  . Low back pain with left-sided sciatica 10/11/2016  . Abdominal pain 05/22/2016  . Hiatal hernia 05/22/2016  . Chronic headaches 05/22/2016  . Menopause 05/22/2016   Past Medical History:  Diagnosis Date  . Anxiety   . Arthritis   . Bursitis   . GERD (gastroesophageal reflux disease)   . Hernia, hiatal   . Hx of trichomoniasis 2017  . Migraine     Family History  Problem Relation Age of Onset  . Diabetes Mother   . Diabetes Maternal Aunt   . Hypertension Maternal Aunt   . Breast cancer Other 35       Stage IV    Past Surgical History:  Procedure Laterality Date  . BACK SURGERY    . TUBAL LIGATION     Social History   Occupational History  .  Not on file  Tobacco Use  . Smoking status: Current Every Day Smoker    Packs/day: 0.25    Years: 20.00    Pack years: 5.00    Types: Cigarettes  . Smokeless tobacco: Never Used  . Tobacco comment: now back on Wellbutrin  Substance and Sexual Activity  . Alcohol use: No  . Drug use: No  . Sexual activity: Yes    Birth control/protection: Surgical

## 2019-07-22 ENCOUNTER — Other Ambulatory Visit: Payer: Self-pay | Admitting: Radiology

## 2019-07-22 DIAGNOSIS — G8929 Other chronic pain: Secondary | ICD-10-CM

## 2019-08-03 ENCOUNTER — Emergency Department (HOSPITAL_COMMUNITY): Payer: 59

## 2019-08-03 ENCOUNTER — Other Ambulatory Visit: Payer: Self-pay

## 2019-08-03 ENCOUNTER — Emergency Department (HOSPITAL_COMMUNITY)
Admission: EM | Admit: 2019-08-03 | Discharge: 2019-08-03 | Disposition: A | Discharge status: Home / Self Care | Payer: 59 | Attending: Emergency Medicine | Admitting: Emergency Medicine

## 2019-08-03 ENCOUNTER — Encounter (HOSPITAL_COMMUNITY): Payer: Self-pay | Admitting: *Deleted

## 2019-08-03 DIAGNOSIS — Z20822 Contact with and (suspected) exposure to covid-19: Secondary | ICD-10-CM | POA: Diagnosis not present

## 2019-08-03 DIAGNOSIS — R05 Cough: Secondary | ICD-10-CM | POA: Insufficient documentation

## 2019-08-03 DIAGNOSIS — Z720 Tobacco use: Secondary | ICD-10-CM

## 2019-08-03 DIAGNOSIS — F17210 Nicotine dependence, cigarettes, uncomplicated: Secondary | ICD-10-CM | POA: Insufficient documentation

## 2019-08-03 DIAGNOSIS — J209 Acute bronchitis, unspecified: Secondary | ICD-10-CM

## 2019-08-03 MED ORDER — METHYLPREDNISOLONE 4 MG PO TBPK: ORAL_TABLET | ORAL | 0 refills | Status: DC

## 2019-08-03 MED ORDER — ALBUTEROL SULFATE HFA 108 (90 BASE) MCG/ACT IN AERS: 2.0000 | INHALATION_SPRAY | RESPIRATORY_TRACT | 0 refills | Status: DC | PRN

## 2019-08-03 NOTE — ED Triage Notes (Signed)
Pt c/o a cough since Sunday worse at night sometimes productive  No temp  Pt is a smoker

## 2019-08-03 NOTE — Discharge Instructions (Signed)
Person Under Monitoring Name: Teresa Faulkner  Location: [address] [place] [address]   Infection Prevention Recommendations for Individuals Confirmed to have, or Being Evaluated for, 2019 Novel Coronavirus (COVID-19) Infection Who Receive Care at Home  Individuals who are confirmed to have, or are being evaluated for, COVID-19 should follow the prevention steps below until a healthcare provider or local or state health department says they can return to normal activities.  Stay home except to get medical care You should restrict activities outside your home, except for getting medical care. Do not go to work, school, or public areas, and do not use public transportation or taxis.  Call ahead before visiting your doctor Before your medical appointment, call the healthcare provider and tell them that you have, or are being evaluated for, COVID-19 infection. This will help the healthcare provider's office take steps to keep other people from getting infected. Ask your healthcare provider to call the local or state health department.  Monitor your symptoms Seek prompt medical attention if your illness is worsening (e.g., difficulty breathing). Before going to your medical appointment, call the healthcare provider and tell them that you have, or are being evaluated for, COVID-19 infection. Ask your healthcare provider to call the local or state health department.  Wear a facemask You should wear a facemask that covers your nose and mouth when you are in the same room with other people and when you visit a healthcare provider. People who live with or visit you should also wear a facemask while they are in the same room with you.  Separate yourself from other people in your home As much as possible, you should stay in a different room from other people in your home. Also, you should use a separate bathroom, if available.  Avoid sharing household items You should  not share dishes, drinking glasses, cups, eating utensils, towels, bedding, or other items with other people in your home. After using these items, you should wash them thoroughly with soap and water.  Cover your coughs and sneezes Cover your mouth and nose with a tissue when you cough or sneeze, or you can cough or sneeze into your sleeve. Throw used tissues in a lined trash can, and immediately wash your hands with soap and water for at least 20 seconds or use an alcohol-based hand rub.  Wash your [employer] your hands often and thoroughly with soap and water for at least 20 seconds. You can use an alcohol-based hand sanitizer if soap and water are not available and if your hands are not visibly dirty. Avoid touching your eyes, nose, and mouth with unwashed hands.   Prevention Steps for Caregivers and Household Members of Individuals Confirmed to have, or Being Evaluated for, COVID-19 Infection Being Cared for in the Home  If you live with, or provide care at home for, a person confirmed to have, or being evaluated for, COVID-19 infection please follow these guidelines to prevent infection:  Follow healthcare provider's instructions Make sure that you understand and can help the patient follow any healthcare provider instructions for all care.  Provide for the patient's basic needs You should help the patient with basic needs in the home and provide support for getting groceries, prescriptions, and other personal needs.  Monitor the patient's symptoms If they are getting sicker, call his or her medical provider and tell them that the patient has, or is being evaluated for, COVID-19 infection. This will help the  healthcare provider's office take steps to keep other people from getting infected. Ask the healthcare provider to call the local or state health department.  Limit the number of people who have contact with the patient If possible, have only one caregiver for the  patient. Other household members should stay in another home or place of residence. If this is not possible, they should stay in another room, or be separated from the patient as much as possible. Use a separate bathroom, if available. Restrict visitors who do not have an essential need to be in the home.  Keep older adults, very young children, and other sick people away from the patient Keep older adults, very young children, and those who have compromised immune systems or chronic health conditions away from the patient. This includes people with chronic heart, lung, or kidney conditions, diabetes, and cancer.  Ensure good ventilation Make sure that shared spaces in the home have good air flow, such as from an air conditioner or an opened window, weather permitting.  Wash your hands often Wash your hands often and thoroughly with soap and water for at least 20 seconds. You can use an alcohol based hand sanitizer if soap and water are not available and if your hands are not visibly dirty. Avoid touching your eyes, nose, and mouth with unwashed hands. Use disposable paper towels to dry your hands. If not available, use dedicated cloth towels and replace them when they become wet.  Wear a facemask and gloves Wear a disposable facemask at all times in the room and gloves when you touch or have contact with the patient's blood, body fluids, and/or secretions or excretions, such as sweat, saliva, sputum, nasal mucus, vomit, urine, or feces.  Ensure the mask fits over your nose and mouth tightly, and do not touch it during use. Throw out disposable facemasks and gloves after using them. Do not reuse. Wash your hands immediately after removing your facemask and gloves. If your personal clothing becomes contaminated, carefully remove clothing and launder. Wash your hands after handling contaminated clothing. Place all used disposable facemasks, gloves, and other waste in a lined container before  disposing them with other household waste. Remove gloves and wash your hands immediately after handling these items.  Do not share dishes, glasses, or other household items with the patient Avoid sharing household items. You should not share dishes, drinking glasses, cups, eating utensils, towels, bedding, or other items with a patient who is confirmed to have, or being evaluated for, COVID-19 infection. After the person uses these items, you should wash them thoroughly with soap and water.  Wash laundry thoroughly Immediately remove and wash clothes or bedding that have blood, body fluids, and/or secretions or excretions, such as sweat, saliva, sputum, nasal mucus, vomit, urine, or feces, on them. Wear gloves when handling laundry from the patient. Read and follow directions on labels of laundry or clothing items and detergent. In general, wash and dry with the warmest temperatures recommended on the label.  Clean all areas the individual has used often Clean all touchable surfaces, such as counters, tabletops, doorknobs, bathroom fixtures, toilets, phones, keyboards, tablets, and bedside tables, every day. Also, clean any surfaces that may have blood, body fluids, and/or secretions or excretions on them. Wear gloves when cleaning surfaces the patient has come in contact with. Use a diluted bleach solution (e.g., dilute bleach with 1 part bleach and 10 parts water) or a household disinfectant with a label that says EPA-registered for coronaviruses. To  make a bleach solution at home, add 1 tablespoon of bleach to 1 quart (4 cups) of water. For a larger supply, add  cup of bleach to 1 gallon (16 cups) of water. Read labels of cleaning products and follow recommendations provided on product labels. Labels contain instructions for safe and effective use of the cleaning product including precautions you should take when applying the product, such as wearing gloves or eye protection and making sure you  have good ventilation during use of the product. Remove gloves and wash hands immediately after cleaning.  Monitor yourself for signs and symptoms of illness Caregivers and household members are considered close contacts, should monitor their health, and will be asked to limit movement outside of the home to the extent possible. Follow the monitoring steps for close contacts listed on the symptom monitoring form.   ? If you have additional questions, contact your local health department or call the epidemiologist on call at [phone] (available 24/7). ? This guidance is subject to change. For the most up-to-date guidance from [facility], please refer to their website: [email or web address]

## 2019-08-03 NOTE — ED Provider Notes (Signed)
[facility] EMERGENCY DEPARTMENT Provider Note   CSN: [id number] Arrival date & time: 08/03/19  0433     History Chief Complaint  Patient presents with  . Cough    Teresa Faulkner is a 54 y.o. female.  Who presents emergency department with chief complaint of flulike symptoms.  The patient states that exactly 1 week ago she began having symptoms of abdominal upset, nausea vomiting and diarrhea.  The next day she developed a cough which is worse at night.  She has had associated fatigue, body aches, chills, decreased appetite, headaches.  She is a daily smoker and continues to smoke despite trying to quit and currently taking Wellbutrin.  She has had some wheezing and has a history of asthmatic bronchitis.  Patient was tested yesterday for Covid and her test is currently pending  HPI     Past Medical History:  Diagnosis Date  . Anxiety   . Arthritis   . Bursitis   . GERD (gastroesophageal reflux disease)   . Hernia, hiatal   . Hx of trichomoniasis 2017  . Migraine     Patient Active Problem List   Diagnosis Date Noted  . Urinary frequency 06/25/2019  . Change in bowel movement 05/28/2019  . Gastroesophageal reflux disease without esophagitis 05/28/2019  . Encounter for screening for metabolic disorder 05/28/2019  . Low back pain with left-sided sciatica 10/11/2016  . Abdominal pain 05/22/2016  . Hiatal hernia 05/22/2016  . Chronic headaches 05/22/2016  . Menopause 05/22/2016    Past Surgical History:  Procedure Laterality Date  . BACK SURGERY    . TUBAL LIGATION       OB History    Gravida  2   Para  2   Term  2   Preterm  0   AB  0   Living  2     SAB  0   TAB  0   Ectopic  0   Multiple  0   Live Births              Family History  Problem Relation Age of Onset  . Diabetes Mother   . Diabetes Maternal Aunt   . Hypertension Maternal Aunt   . Breast cancer Other 35       Stage IV    Social History   Tobacco Use   . Smoking status: Current Every Day Smoker    Packs/day: 0.25    Years: 20.00    Pack years: 5.00    Types: Cigarettes  . Smokeless tobacco: Never Used  . Tobacco comment: now back on Wellbutrin  Substance Use Topics  . Alcohol use: No  . Drug use: No    Home Medications Prior to Admission medications   Medication Sig Start Date End Date Taking? Authorizing Provider  amoxicillin (AMOXIL) 875 MG tablet Take 1 tablet (875 mg total) by mouth 2 (two) times daily. 06/30/19   Arnette Felts, FNP  buPROPion (WELLBUTRIN SR) 150 MG 12 hr tablet Take 1 tablet (150 mg total) by mouth daily. 05/28/19 05/27/20  Arnette Felts, FNP  pantoprazole (PROTONIX) 20 MG tablet Take 1 tablet (20 mg total) by mouth daily. 05/28/19   Arnette Felts, FNP    Allergies    Patient has no known allergies.  Review of Systems   Review of Systems Ten systems reviewed and are negative for acute change, except as noted in the HPI.   Physical Exam Updated Vital Signs BP 137/75 (BP Location: Left  Arm)   Pulse 92   Temp 98.5 F (36.9 C) (Oral)   Resp 16   Ht 5\' 7"  (1.702 m)   Wt 96.6 kg   SpO2 97%   BMI 33.36 kg/m   Physical Exam Vitals and nursing note reviewed.  Constitutional:      General: She is not in acute distress.    Appearance: She is well-developed. She is ill-appearing. She is not diaphoretic.  HENT:     Head: Normocephalic and atraumatic.     Mouth/Throat:     Mouth: Mucous membranes are moist.  Eyes:     General: No scleral icterus.    Conjunctiva/sclera: Conjunctivae normal.  Cardiovascular:     Rate and Rhythm: Normal rate and regular rhythm.     Heart sounds: Normal heart sounds. No murmur. No friction rub. No gallop.   Pulmonary:     Effort: Pulmonary effort is normal. No respiratory distress.     Breath sounds: Wheezing present.     Comments: Mild expiratory wheeze at the end of cough Abdominal:     General: Bowel sounds are normal. There is no distension.     Palpations: Abdomen  is soft. There is no mass.     Tenderness: There is no abdominal tenderness. There is no guarding.  Musculoskeletal:     Cervical back: Normal range of motion.  Skin:    General: Skin is warm and dry.  Neurological:     Mental Status: She is alert and oriented to person, place, and time.  Psychiatric:        Behavior: Behavior normal.     ED Results / Procedures / Treatments   Labs (all labs ordered are listed, but only abnormal results are displayed) Labs Reviewed - No data to display  EKG None  Radiology DG Chest 2 View  Result Date: 08/03/2019 CLINICAL DATA:  Cough since Sunday. EXAM: CHEST - 2 VIEW COMPARISON:  07/07/2018 FINDINGS: The heart size and mediastinal contours are within normal limits. Both lungs are clear. The visualized skeletal structures are unremarkable. IMPRESSION: No active cardiopulmonary disease. Electronically Signed   By: Hetal  Patel   On: 08/03/2019 05:04    Procedures Procedures (including critical care time)  Medications Ordered in ED Medications - No data to display  ED Course  I have reviewed the triage vital signs and the nursing notes.  Pertinent labs & imaging results that were available during my care of the patient were reviewed by me and considered in my medical decision making (see chart for details).    MDM Rules/Calculators/A&P                      53  year old female history of tobacco abuse and flulike symptoms for 1 week.  Patient symptoms are suspicious for COVID-19 infection.  Her outpatient test is currently pending.  She has some mild associated reactive airway and I have ordered Medrol Dosepak and albuterol.  Patient is afebrile, hemodynamically stable without hypoxia.  Patient is to remain on home isolation precautions.The patient was counseled on the dangers of tobacco use, and was advised to quit. Reviewed strategies to maximize success, including removing cigarettes and smoking materials from environment, stress management,  substitution of other forms of reinforcement, support of family/friends and written materials.  Teresa Faulkner was evaluated in Emergency Department on 08/03/2019 for the symptoms described in the history of present illness. She was evaluated in the context of the global COVID-19 pandemic, which necessitated consideration  that the patient might be at risk for infection with the SARS-CoV-2 virus that causes COVID-19. Institutional protocols and algorithms that pertain to the evaluation of patients at risk for COVID-19 are in a state of rapid change based on information released by regulatory bodies including the CDC and federal and state organizations. These policies and algorithms were followed during the patient's care in the ED.  Final Clinical Impression(s) / ED Diagnoses Final diagnoses:  None    Rx / DC Orders ED Discharge Orders    None       Arthor Captain, PA-C 08/03/19 0746    Tilden Fossa, MD 08/03/19 0930

## 2019-08-13 ENCOUNTER — Encounter: Payer: Self-pay | Admitting: Nurse Practitioner

## 2019-08-14 ENCOUNTER — Telehealth: Payer: 59 | Admitting: Physician Assistant

## 2019-08-14 DIAGNOSIS — R059 Cough, unspecified: Secondary | ICD-10-CM

## 2019-08-14 DIAGNOSIS — R05 Cough: Secondary | ICD-10-CM | POA: Diagnosis not present

## 2019-08-14 MED ORDER — LORATADINE 10 MG PO TABS: 10.0000 mg | ORAL_TABLET | Freq: Every day | ORAL | 0 refills | Status: DC

## 2019-08-14 MED ORDER — DOXYCYCLINE HYCLATE 100 MG PO TABS: 100.0000 mg | ORAL_TABLET | Freq: Two times a day (BID) | ORAL | 0 refills | Status: DC

## 2019-08-14 MED ORDER — BENZONATATE 100 MG PO CAPS: 100.0000 mg | ORAL_CAPSULE | Freq: Three times a day (TID) | ORAL | 0 refills | Status: AC

## 2019-08-14 NOTE — Progress Notes (Signed)
We are sorry that you are not feeling well.  Here is how we plan to help!  Based on your presentation I believe you most likely have A cough due to bacteria.  When patients have a fever and a productive cough with a change in color or increased sputum production, we are concerned about bacterial bronchitis.  If left untreated it can progress to pneumonia.  If your symptoms do not improve with your treatment plan it is important that you contact your provider.   I have prescribed Doxycycline 100 mg twice a day for 7 days     In addition you may use A prescription cough medication called Tessalon Perles 100mg . You may take 1-2 capsules every 8 hours as needed for your cough.  I have also prescribed a medication called Claritin to help treat allergies, as I think that this may be contributing to your symptoms as well.   From your responses in the eVisit questionnaire you describe inflammation in the upper respiratory tract which is causing a significant cough.  This is commonly called Bronchitis and has four common causes:    Allergies  Viral Infections  Acid Reflux  Bacterial Infection Allergies, viruses and acid reflux are treated by controlling symptoms or eliminating the cause. An example might be a cough caused by taking certain blood pressure medications. You stop the cough by changing the medication. Another example might be a cough caused by acid reflux. Controlling the reflux helps control the cough.  USE OF BRONCHODILATOR ("RESCUE") INHALERS: There is a risk from using your bronchodilator too frequently.  The risk is that over-reliance on a medication which only relaxes the muscles surrounding the breathing tubes can reduce the effectiveness of medications prescribed to reduce swelling and congestion of the tubes themselves.  Although you feel brief relief from the bronchodilator inhaler, your asthma may actually be worsening with the tubes becoming more swollen and filled with mucus.   This can delay other crucial treatments, such as oral steroid medications. If you need to use a bronchodilator inhaler daily, several times per day, you should discuss this with your provider.  There are probably better treatments that could be used to keep your asthma under control.     HOME CARE . Only take medications as instructed by your medical team. . Complete the entire course of an antibiotic. . Drink plenty of fluids and get plenty of rest. . Avoid close contacts especially the very young and the elderly . Cover your mouth if you cough or cough into your sleeve. . Always remember to wash your hands . A steam or ultrasonic humidifier can help congestion.   GET HELP RIGHT AWAY IF: . You develop worsening fever. . You become short of breath . You cough up blood. . Your symptoms persist after you have completed your treatment plan MAKE SURE YOU   Understand these instructions.  Will watch your condition.  Will get help right away if you are not doing well or get worse.  Your e-visit answers were reviewed by a board certified advanced clinical practitioner to complete your personal care plan.  Depending on the condition, your plan could have included both over the counter or prescription medications. If there is a problem please reply  once you have received a response from your provider. Your safety is important to .  If you have drug allergies check your prescription carefully.    You can use MyChart to ask questions about today's visit, request a non-urgent  call back, or ask for a work or school excuse for 24 hours related to this e-Visit. If it has been greater than 24 hours you will need to follow up with your provider, or enter a new e-Visit to address those concerns. You will get an e-mail in the next two days asking about your experience.  I hope that your e-visit has been valuable and will speed your recovery. Thank you for using e-visits.  Approximately 5 minutes was  spent documenting and reviewing patient's chart.

## 2019-08-29 ENCOUNTER — Encounter: Payer: Self-pay | Admitting: Radiology

## 2019-09-09 ENCOUNTER — Other Ambulatory Visit: Payer: Self-pay

## 2019-09-09 ENCOUNTER — Telehealth (INDEPENDENT_AMBULATORY_CARE_PROVIDER_SITE_OTHER): Payer: 59 | Admitting: Nurse Practitioner

## 2019-09-09 ENCOUNTER — Telehealth: Payer: Self-pay

## 2019-09-09 ENCOUNTER — Encounter: Payer: Self-pay | Admitting: Nurse Practitioner

## 2019-09-09 VITALS — Wt 213.0 lb

## 2019-09-09 DIAGNOSIS — R197 Diarrhea, unspecified: Secondary | ICD-10-CM | POA: Diagnosis not present

## 2019-09-09 DIAGNOSIS — R1084 Generalized abdominal pain: Secondary | ICD-10-CM

## 2019-09-09 MED ORDER — DICYCLOMINE HCL 10 MG PO CAPS: 10.0000 mg | ORAL_CAPSULE | Freq: Three times a day (TID) | ORAL | 1 refills | Status: DC

## 2019-09-09 NOTE — Telephone Encounter (Signed)
Patient consented to a virtual appointment. YL,RMA 

## 2019-09-09 NOTE — Progress Notes (Signed)
Virtual Visit via Video   This visit type was conducted due to national recommendations for restrictions regarding the COVID-19 Pandemic (e.g. social distancing) in an effort to limit this patient's exposure and mitigate transmission in our community.  Due to her co-morbid illnesses, this patient is at least at moderate risk for complications without adequate follow up.  This format is felt to be most appropriate for this patient at this time.  All issues noted in this document were discussed and addressed.  A limited physical exam was performed with this format.    This visit type was conducted due to national recommendations for restrictions regarding the COVID-19 Pandemic (e.g. social distancing) in an effort to limit this patient's exposure and mitigate transmission in our community.  Patients identity confirmed using two different identifiers.  This format is felt to be most appropriate for this patient at this time.  All issues noted in this document were discussed and addressed.  No physical exam was performed (except for noted visual exam findings with Video Visits).    Date:  09/09/2019   ID:  Alcide Clever, DOB November 30, 1965, MRN [id number]  Patient Location:  Home - spoke with Elberta Spaniel  Provider location:   Office    Chief Complaint:  diarrhea  History of Present Illness:    AQUANETTA SCHWARZ is a 54 y.o. female who presents via video conferencing for a telehealth visit today.    The patient does have symptoms concerning for COVID-19 infection (fever, chills, cough, or new shortness of breath).   She ate at [employer] - yesterday while at work Sunday did not eat anything - collard greens, ribs but did not eat everything. She went to work yesterday and worse since yesterday.  No known sick family members.  She has not had the covid vaccine  Abdominal Pain This is a new problem. The current episode started in the past 7 days (2 days ago). The onset quality is sudden. The  problem occurs constantly. The pain is at a severity of 8/10. The quality of the pain is aching. Pain radiation: upper abdomen pain. Associated symptoms include diarrhea. Pertinent negatives include no arthralgias, fever, headaches or vomiting.  Diarrhea  This is a new problem. The current episode started yesterday. The problem occurs 2 to 4 times per day. Associated symptoms include abdominal pain. Pertinent negatives include no arthralgias, chills, fever, headaches or vomiting. Associated symptoms comments: Every now and then will get cold, will occasionally get hot and feel dizzy. There are no known risk factors. She has tried nothing (pepto bismol) for the symptoms. There is no history of bowel resection.     Past Medical History:  Diagnosis Date  . Anxiety   . Arthritis   . Bursitis   . GERD (gastroesophageal reflux disease)   . Hernia, hiatal   . Hx of trichomoniasis 2017  . Migraine    Past Surgical History:  Procedure Laterality Date  . BACK SURGERY    . TUBAL LIGATION       Current Meds  Medication Sig  . albuterol (VENTOLIN HFA) 108 (90 Base) MCG/ACT inhaler Inhale 2 puffs into the lungs every 4 (four) hours as needed for wheezing or shortness of breath.  Marland Kitchen buPROPion (WELLBUTRIN SR) 150 MG 12 hr tablet Take 1 tablet (150 mg total) by mouth daily.  Marland Kitchen doxycycline (VIBRA-TABS) 100 MG tablet Take 1 tablet (100 mg total) by mouth 2 (two) times daily.  Marland Kitchen loratadine (CLARITIN) 10 MG tablet  Take 1 tablet (10 mg total) by mouth daily.  . pantoprazole (PROTONIX) 20 MG tablet Take 1 tablet (20 mg total) by mouth daily.     Allergies:   Patient has no known allergies.   Social History   Tobacco Use  . Smoking status: Current Every Day Smoker    Packs/day: 0.25    Years: 20.00    Pack years: 5.00    Types: Cigarettes  . Smokeless tobacco: Never Used  . Tobacco comment: now back on Wellbutrin  Substance Use Topics  . Alcohol use: No  . Drug use: No     Family Hx: The  patient's family history includes Breast cancer (age of onset: 65) in an other family member; Diabetes in her maternal aunt and mother; Hypertension in her maternal aunt.  ROS:   Please see the history of present illness.    Review of Systems  Constitutional: Negative for chills and fever.  Respiratory: Negative.   Cardiovascular: Negative.  Negative for palpitations.  Gastrointestinal: Positive for abdominal pain and diarrhea. Negative for vomiting.  Genitourinary: Negative.  Negative for flank pain.  Musculoskeletal: Negative for arthralgias.  Neurological: Negative for dizziness, tingling and headaches.  Psychiatric/Behavioral: Negative.     All other systems reviewed and are negative.   Labs/Other Tests and Data Reviewed:    Recent Labs: 03/08/2019: Hemoglobin 13.8; Platelets 272 06/25/2019: ALT 15; BUN 9; Creatinine, Ser 0.83; Potassium 4.3; Sodium 143   Recent Lipid Panel Lab Results  Component Value Date/Time   CHOL 181 06/25/2019 12:31 PM   TRIG 160 (H) 06/25/2019 12:31 PM   HDL 43 06/25/2019 12:31 PM   CHOLHDL 4.2 06/25/2019 12:31 PM   LDLCALC 110 (H) 06/25/2019 12:31 PM    Wt Readings from Last 3 Encounters:  09/09/19 213 lb (96.6 kg)  08/03/19 213 lb (96.6 kg)  07/02/19 218 lb (98.9 kg)     Exam:    Vital Signs:  Wt 213 lb (96.6 kg)   BMI 33.36 kg/m     Physical Exam  Constitutional: She is oriented to person, place, and time and well-developed, well-nourished, and in no distress. No distress.  Pulmonary/Chest: No respiratory distress.  Neurological: She is alert and oriented to person, place, and time.  Psychiatric: Mood, memory, affect and judgment normal.    ASSESSMENT & PLAN:    1. Generalized abdominal pain  Will send bentyl for the abdominal pain  Advised if worsens to return call to office or go to urgent care - dicyclomine (BENTYL) 10 MG capsule; Take 1 capsule (10 mg total) by mouth 4 (four) times daily -  before meals and at bedtime.   Dispense: 30 capsule; Refill: 1  2. Diarrhea, unspecified type  She is to go for Covid testing  Encouraged to increase fluid intake and to eat a bland diet.   Push fluids  She is to have a neighbor to come and pick up stool samples.   Out of work until Thursday  If worsens she is to go to urgent care or ER she may need IV fluids. - Culture, Stool - Ova and parasite examination   COVID-19 Education: The signs and symptoms of COVID-19 were discussed with the patient and how to seek care for testing (follow up with PCP or arrange E-visit).  The importance of social distancing was discussed today.  Patient Risk:   After full review of this patients clinical status, I feel that they are at least moderate risk at this time.  Time:  Today, I have spent 10 minutes on mychart and 3 minutes/ seconds on telephone with the patient with telehealth technology discussing above diagnoses.     Medication Adjustments/Labs and Tests Ordered: Current medicines are reviewed at length with the patient today.  Concerns regarding medicines are outlined above.   Tests Ordered: Orders Placed This Encounter  Procedures  . Culture, Stool  . Ova and parasite examination    Medication Changes: Meds ordered this encounter  Medications  . dicyclomine (BENTYL) 10 MG capsule    Sig: Take 1 capsule (10 mg total) by mouth 4 (four) times daily -  before meals and at bedtime.    Dispense:  30 capsule    Refill:  1    Disposition:  Follow up prn  Signed, [name], FNP

## 2019-09-12 LAB — OVA AND PARASITE EXAMINATION

## 2019-09-14 LAB — STOOL CULTURE: E coli, Shiga toxin Assay: NEGATIVE

## 2019-09-15 ENCOUNTER — Other Ambulatory Visit: Payer: Self-pay

## 2019-09-15 DIAGNOSIS — R197 Diarrhea, unspecified: Secondary | ICD-10-CM

## 2019-09-15 NOTE — Progress Notes (Signed)
She needs to continue to push fluids and have her to come and get labs CBC with diff and BMP today if possible.

## 2019-09-16 ENCOUNTER — Other Ambulatory Visit: Payer: Self-pay

## 2019-09-16 ENCOUNTER — Other Ambulatory Visit: Payer: 59

## 2019-09-16 DIAGNOSIS — R197 Diarrhea, unspecified: Secondary | ICD-10-CM

## 2019-09-17 LAB — CBC WITH DIFFERENTIAL/PLATELET
Basophils Absolute: 0.1 10*3/uL (ref 0.0–0.2)
Basos: 1 %
EOS (ABSOLUTE): 0.2 10*3/uL (ref 0.0–0.4)
Eos: 2 %
Hematocrit: 43.6 % (ref 34.0–46.6)
Hemoglobin: 13.9 g/dL (ref 11.1–15.9)
Immature Grans (Abs): 0 10*3/uL (ref 0.0–0.1)
Immature Granulocytes: 0 %
Lymphocytes Absolute: 4.4 10*3/uL — ABNORMAL HIGH (ref 0.7–3.1)
Lymphs: 46 %
MCH: 28 pg (ref 26.6–33.0)
MCHC: 31.9 g/dL (ref 31.5–35.7)
MCV: 88 fL (ref 79–97)
Monocytes Absolute: 0.5 10*3/uL (ref 0.1–0.9)
Monocytes: 6 %
Neutrophils Absolute: 4.2 10*3/uL (ref 1.4–7.0)
Neutrophils: 45 %
Platelets: 315 10*3/uL (ref 150–450)
RBC: 4.96 x10E6/uL (ref 3.77–5.28)
RDW: 13.2 % (ref 11.7–15.4)
WBC: 9.4 10*3/uL (ref 3.4–10.8)

## 2019-09-17 LAB — BMP8+ANION GAP
Anion Gap: 14 mmol/L (ref 10.0–18.0)
BUN/Creatinine Ratio: 14 (ref 9–23)
BUN: 12 mg/dL (ref 6–24)
CO2: 23 mmol/L (ref 20–29)
Calcium: 9.3 mg/dL (ref 8.7–10.2)
Chloride: 104 mmol/L (ref 96–106)
Creatinine, Ser: 0.87 mg/dL (ref 0.57–1.00)
GFR calc Af Amer: 89 mL/min/{1.73_m2} (ref >59)
GFR calc non Af Amer: 77 mL/min/{1.73_m2} (ref >59)
Glucose: 84 mg/dL (ref 65–99)
Potassium: 4.4 mmol/L (ref 3.5–5.2)
Sodium: 141 mmol/L (ref 134–144)

## 2019-09-28 ENCOUNTER — Other Ambulatory Visit: Payer: Self-pay | Admitting: Nurse Practitioner

## 2019-09-28 DIAGNOSIS — K219 Gastro-esophageal reflux disease without esophagitis: Secondary | ICD-10-CM

## 2019-09-29 ENCOUNTER — Ambulatory Visit: Payer: 59 | Admitting: Nurse Practitioner

## 2019-12-23 ENCOUNTER — Ambulatory Visit: Payer: 59 | Admitting: Nurse Practitioner

## 2020-01-07 ENCOUNTER — Other Ambulatory Visit: Payer: Self-pay

## 2020-01-07 DIAGNOSIS — K219 Gastro-esophageal reflux disease without esophagitis: Secondary | ICD-10-CM

## 2020-01-07 MED ORDER — PANTOPRAZOLE SODIUM 20 MG PO TBEC: 20.0000 mg | DELAYED_RELEASE_TABLET | Freq: Every day | ORAL | 1 refills | Status: DC

## 2020-03-09 ENCOUNTER — Ambulatory Visit: Payer: 59 | Admitting: Nurse Practitioner

## 2020-03-09 ENCOUNTER — Other Ambulatory Visit: Payer: Self-pay

## 2020-05-07 ENCOUNTER — Ambulatory Visit (HOSPITAL_COMMUNITY)
Admission: EM | Admit: 2020-05-07 | Discharge: 2020-05-07 | Disposition: A | Discharge status: Home / Self Care | Payer: 59

## 2020-05-07 ENCOUNTER — Other Ambulatory Visit: Payer: Self-pay

## 2020-05-13 ENCOUNTER — Encounter (HOSPITAL_COMMUNITY): Payer: Self-pay | Admitting: Emergency Medicine

## 2020-05-13 ENCOUNTER — Other Ambulatory Visit: Payer: Self-pay

## 2020-05-13 ENCOUNTER — Emergency Department (HOSPITAL_COMMUNITY)
Admission: EM | Admit: 2020-05-13 | Discharge: 2020-05-13 | Disposition: A | Discharge status: Home / Self Care | Payer: BLUE CROSS/BLUE SHIELD | Attending: Emergency Medicine | Admitting: Emergency Medicine

## 2020-05-13 ENCOUNTER — Emergency Department (HOSPITAL_COMMUNITY): Payer: BLUE CROSS/BLUE SHIELD

## 2020-05-13 DIAGNOSIS — U071 COVID-19: Secondary | ICD-10-CM

## 2020-05-13 DIAGNOSIS — R1110 Vomiting, unspecified: Secondary | ICD-10-CM | POA: Insufficient documentation

## 2020-05-13 DIAGNOSIS — R059 Cough, unspecified: Secondary | ICD-10-CM | POA: Diagnosis present

## 2020-05-13 DIAGNOSIS — F17210 Nicotine dependence, cigarettes, uncomplicated: Secondary | ICD-10-CM | POA: Insufficient documentation

## 2020-05-13 LAB — BASIC METABOLIC PANEL
Anion gap: 11 (ref 5–15)
BUN: 9 mg/dL (ref 6–20)
CO2: 25 mmol/L (ref 22–32)
Calcium: 8.8 mg/dL — ABNORMAL LOW (ref 8.9–10.3)
Chloride: 104 mmol/L (ref 98–111)
Creatinine, Ser: 0.9 mg/dL (ref 0.44–1.00)
GFR, Estimated: >60 mL/min (ref >60)
Glucose, Bld: 118 mg/dL — ABNORMAL HIGH (ref 70–99)
Potassium: 3.4 mmol/L — ABNORMAL LOW (ref 3.5–5.1)
Sodium: 140 mmol/L (ref 135–145)

## 2020-05-13 LAB — CBC WITH DIFFERENTIAL/PLATELET
Abs Immature Granulocytes: 0.01 K/uL (ref 0.00–0.07)
Basophils Absolute: 0 K/uL (ref 0.0–0.1)
Basophils Relative: 0 %
Eosinophils Absolute: 0.1 K/uL (ref 0.0–0.5)
Eosinophils Relative: 2 %
HCT: 39.5 % (ref 36.0–46.0)
Hemoglobin: 12.9 g/dL (ref 12.0–15.0)
Immature Granulocytes: 0 %
Lymphocytes Relative: 35 %
Lymphs Abs: 2.3 K/uL (ref 0.7–4.0)
MCH: 29.4 pg (ref 26.0–34.0)
MCHC: 32.7 g/dL (ref 30.0–36.0)
MCV: 90 fL (ref 80.0–100.0)
Monocytes Absolute: 0.6 K/uL (ref 0.1–1.0)
Monocytes Relative: 9 %
Neutro Abs: 3.6 K/uL (ref 1.7–7.7)
Neutrophils Relative %: 54 %
Platelets: 224 K/uL (ref 150–400)
RBC: 4.39 MIL/uL (ref 3.87–5.11)
RDW: 14.2 % (ref 11.5–15.5)
WBC: 6.7 K/uL (ref 4.0–10.5)
nRBC: 0 % (ref 0.0–0.2)

## 2020-05-13 LAB — RESP PANEL BY RT-PCR (FLU A&B, COVID) ARPGX2
Influenza A by PCR: NEGATIVE
Influenza B by PCR: NEGATIVE
SARS Coronavirus 2 by RT PCR: POSITIVE — AB

## 2020-05-13 MED ORDER — POTASSIUM CHLORIDE CRYS ER 20 MEQ PO TBCR
40.0000 meq | EXTENDED_RELEASE_TABLET | Freq: Once | ORAL | Status: AC
  Administered 2020-05-13: 40 meq via ORAL
  Filled 2020-05-13: qty 2

## 2020-05-13 MED ORDER — NAPROXEN 500 MG PO TABS: 500.0000 mg | ORAL_TABLET | Freq: Two times a day (BID) | ORAL | 0 refills | Status: DC | PRN

## 2020-05-13 MED ORDER — METHOCARBAMOL 500 MG PO TABS: 500.0000 mg | ORAL_TABLET | Freq: Three times a day (TID) | ORAL | 0 refills | Status: DC | PRN

## 2020-05-13 MED ORDER — ACETAMINOPHEN 500 MG PO TABS
1000.0000 mg | ORAL_TABLET | Freq: Once | ORAL | Status: AC
  Administered 2020-05-13: 1000 mg via ORAL
  Filled 2020-05-13: qty 2

## 2020-05-13 MED ORDER — POTASSIUM CHLORIDE CRYS ER 20 MEQ PO TBCR: 20.0000 meq | EXTENDED_RELEASE_TABLET | Freq: Every day | ORAL | 0 refills | Status: DC

## 2020-05-13 MED ORDER — BENZONATATE 100 MG PO CAPS: 100.0000 mg | ORAL_CAPSULE | Freq: Three times a day (TID) | ORAL | 0 refills | Status: DC

## 2020-05-13 NOTE — ED Provider Notes (Signed)
[facility] EMERGENCY DEPARTMENT Provider Note   CSN: [id number] Arrival date & time: 05/13/20  1926     History Chief Complaint  Patient presents with  . Cough  . Chest Pain    Teresa Faulkner is a 55 y.o. female with a hx of tobacco abuse, GERD, hiatal hernia, migraines & tubal ligation who presents to the ED with complaints of cough over the past 3 days with subsequent intermittent lower chest/rib pain. Patient states that Teresa Faulkner has had symptoms including chills, generalized body aches, fatigue, and dry cough, Teresa Faulkner has subsequently developed bilateral lower anterior chest/rib discomfort with coughing and with certain movements-otherwise no pain as well as intermittent shortness of breath. No other specific alleviating or aggravating factors. Did have one episode of vomiting yesterday but has been able to tolerate p.o. today. No sick contacts with similar symptoms. Teresa Faulkner denies fever, abdominal pain, diarrhea, melena, hematochezia, leg pain/swelling, hemoptysis, recent surgery/trauma, recent long travel, hormone use, personal hx of cancer, or hx of DVT/PE.   HPI     Past Medical History:  Diagnosis Date  . Anxiety   . Arthritis   . Bursitis   . GERD (gastroesophageal reflux disease)   . Hernia, hiatal   . Hx of trichomoniasis 2017  . Migraine     Patient Active Problem List   Diagnosis Date Noted  . Urinary frequency 06/25/2019  . Change in bowel movement 05/28/2019  . Gastroesophageal reflux disease without esophagitis 05/28/2019  . Encounter for screening for metabolic disorder 05/28/2019  . Low back pain with left-sided sciatica 10/11/2016  . Abdominal pain 05/22/2016  . Hiatal hernia 05/22/2016  . Chronic headaches 05/22/2016  . Menopause 05/22/2016    Past Surgical History:  Procedure Laterality Date  . BACK SURGERY    . TUBAL LIGATION       OB History    Gravida  2   Para  2   Term  2   Preterm  0   AB  0   Living  2     SAB  0    IAB  0   Ectopic  0   Multiple  0   Live Births              Family History  Problem Relation Age of Onset  . Diabetes Mother   . Diabetes Maternal Aunt   . Hypertension Maternal Aunt   . Breast cancer Other 35       Stage IV    Social History   Tobacco Use  . Smoking status: Current Every Day Smoker    Packs/day: 0.25    Years: 20.00    Pack years: 5.00    Types: Cigarettes  . Smokeless tobacco: Never Used  . Tobacco comment: now back on Wellbutrin  Substance Use Topics  . Alcohol use: No  . Drug use: No    Home Medications Prior to Admission medications   Medication Sig Start Date End Date Taking? Authorizing Provider  albuterol (VENTOLIN HFA) 108 (90 Base) MCG/ACT inhaler Inhale 2 puffs into the lungs every 4 (four) hours as needed for wheezing or shortness of breath. 08/03/19   Arthor Captain, PA-C  buPROPion (WELLBUTRIN SR) 150 MG 12 hr tablet Take 1 tablet (150 mg total) by mouth daily. 05/28/19 05/27/20  Arnette Felts, FNP  dicyclomine (BENTYL) 10 MG capsule Take 1 capsule (10 mg total) by mouth 4 (four) times daily -  before meals and at bedtime. 09/09/19  Arnette Felts, FNP  doxycycline (VIBRA-TABS) 100 MG tablet Take 1 tablet (100 mg total) by mouth 2 (two) times daily. 08/14/19   Couture, Cortni S, PA-C  loratadine (CLARITIN) 10 MG tablet Take 1 tablet (10 mg total) by mouth daily. 08/14/19 09/13/19  Couture, Cortni S, PA-C  pantoprazole (PROTONIX) 20 MG tablet Take 1 tablet (20 mg total) by mouth daily. 01/07/20   Arnette Felts, FNP    Allergies    Patient has no known allergies.  Review of Systems   Review of Systems  Constitutional: Positive for chills and fatigue. Negative for fever.  Respiratory: Positive for cough and shortness of breath.   Cardiovascular: Positive for chest pain. Negative for leg swelling.  Gastrointestinal: Positive for vomiting (x1). Negative for abdominal pain, blood in stool, diarrhea and nausea.  Genitourinary: Negative for  dysuria.  Neurological: Negative for syncope.  All other systems reviewed and are negative.   Physical Exam Updated Vital Signs BP (!) 150/64 (BP Location: Right Arm)   Pulse 99   Temp 99.7 F (37.6 C) (Oral)   Resp 16   SpO2 94%   Physical Exam Vitals and nursing note reviewed.  Constitutional:      General: Teresa Faulkner is not in acute distress.    Appearance: Teresa Faulkner is well-developed. Teresa Faulkner is not toxic-appearing.  HENT:     Head: Normocephalic and atraumatic.  Eyes:     General:        Right eye: No discharge.        Left eye: No discharge.     Conjunctiva/sclera: Conjunctivae normal.  Cardiovascular:     Rate and Rhythm: Normal rate and regular rhythm.  Pulmonary:     Effort: Pulmonary effort is normal. No respiratory distress.     Breath sounds: Normal breath sounds. No wheezing, rhonchi or rales.  Chest:     Chest wall: Tenderness (Anterior bilateral lower chest wall which reproduces patient's pain.) present.  Abdominal:     General: There is no distension.     Palpations: Abdomen is soft.     Tenderness: There is no abdominal tenderness.  Musculoskeletal:     Cervical back: Neck supple.     Right lower leg: No tenderness. No edema.     Left lower leg: No tenderness. No edema.  Skin:    General: Skin is warm and dry.     Findings: No rash.  Neurological:     Mental Status: Teresa Faulkner is alert.     Comments: Clear speech.   Psychiatric:        Behavior: Behavior normal.     ED Results / Procedures / Treatments   Labs (all labs ordered are listed, but only abnormal results are displayed) Labs Reviewed  RESP PANEL BY RT-PCR (FLU A&B, COVID) ARPGX2 - Abnormal; Notable for the following components:      Result Value   SARS Coronavirus 2 by RT PCR POSITIVE (*)    All other components within normal limits  BASIC METABOLIC PANEL - Abnormal; Notable for the following components:   Potassium 3.4 (*)    Glucose, Bld 118 (*)    Calcium 8.8 (*)    All other components within  normal limits  CBC WITH DIFFERENTIAL/PLATELET    EKG EKG Interpretation  Date/Time:  Thursday May 13 2020 19:39:05 EST Ventricular Rate:  91 PR Interval:  158 QRS Duration: 74 QT Interval:  348 QTC Calculation: 428 R Axis:   78 Text Interpretation: Normal sinus rhythm Nonspecific T wave abnormality  Confirmed by Cathren Laine ([id number]) on 05/13/2020 11:26:14 PM   Radiology DG Chest Portable 1 View  Result Date: 05/13/2020 CLINICAL DATA:  Cough. Additional history provided: Patient reports pain under bilateral breasts when coughing with emesis onset Tuesday, patient suspects Teresa Faulkner has COVID-19. EXAM: PORTABLE CHEST 1 VIEW COMPARISON:  Prior chest radiographs 08/03/2019. FINDINGS: Heart size within normal limits. No appreciable airspace consolidation. No evidence of pleural effusion or pneumothorax. No acute bony abnormality identified. IMPRESSION: No evidence of active cardiopulmonary disease. Electronically Signed   By: Jackey Loge DO   On: 05/13/2020 19:58    Procedures Procedures (including critical care time)  Medications Ordered in ED Medications  potassium chloride SA (KLOR-CON) CR tablet 40 mEq (40 mEq Oral Given 05/13/20 2335)  acetaminophen (TYLENOL) tablet 1,000 mg (1,000 mg Oral Given 05/13/20 2335)    ED Course  I have reviewed the triage vital signs and the nursing notes.  Pertinent labs & imaging results that were available during my care of the patient were reviewed by me and considered in my medical decision making (see chart for details).  Teresa Faulkner was evaluated in Emergency Department on 05/13/2020 for the symptoms described in the history of present illness. He/Teresa Faulkner was evaluated in the context of the global COVID-19 pandemic, which necessitated consideration that the patient might be at risk for infection with the SARS-CoV-2 virus that causes COVID-19. Institutional protocols and algorithms that pertain to the evaluation of patients at risk for COVID-19 are in a  state of rapid change based on information released by regulatory bodies including the CDC and federal and state organizations. These policies and algorithms were followed during the patient's care in the ED.    MDM Rules/Calculators/A&P                          Patient presents to the ED with complaints of cough & fatigue x 3 days.  Nontoxic, vitals w/ mildly elevated BP- improved w/ repeat vital signs.   Additional history obtained:  Additional history obtained from chart review nursing note review.  EKG: No STEMI Lab Tests:  I reviewed and interpreted labs, which included:  CBC, BMP: Mild hypokalemia/calcemia.  COVID testing: Positive  Imaging Studies ordered:  CXR ordered by triage, I independently visualized and interpreted imaging which showed No evidence of active cardiopulmonary disease.  Suspect patient is symptomatic from COVID-19. Her chest x-ray does not show signs of pulmonary edema, pneumothorax, or rib fracture. Her labs are overall reassuring, her mild hypokalemia will be orally replaced in the ER with a short course to go home with. Her EKG does not show findings of a STEMI, Teresa Faulkner is low risk wells, I overall suspect her chest/rib discomfort is musculoskeletal related with a possible pleurisy component-completely reproducible with chest wall palpation and only occurs with certain position changes and coughing. I personally had the patient ambulate throughout exam room Teresa Faulkner maintained SPO2 97 to 100% on room air without findings of respiratory distress. Teresa Faulkner overall appears appropriate for discharge home at this time. Will provide supportive care. I discussed results, treatment plan, need for follow-up, and return precautions with the patient. Provided opportunity for questions, patient confirmed understanding and is in agreement with plan.    Portions of this note were generated with Scientist, clinical (histocompatibility and immunogenetics). Dictation errors may occur despite best attempts at  proofreading.  Final Clinical Impression(s) / ED Diagnoses Final diagnoses:  COVID-19    Rx / DC Orders ED Discharge  Orders         Ordered    naproxen (NAPROSYN) 500 MG tablet  2 times daily PRN        05/13/20 2322    methocarbamol (ROBAXIN) 500 MG tablet  Every 8 hours PRN        05/13/20 2322    benzonatate (TESSALON) 100 MG capsule  Every 8 hours        05/13/20 2322    potassium chloride SA (KLOR-CON) 20 MEQ tablet  Daily        05/13/20 2322           Amaryllis Dyke, PA-C 05/13/20 2344    Lajean Saver, MD 05/14/20 [phone]

## 2020-05-13 NOTE — Discharge Instructions (Addendum)
Your COVID-19 test was positive today, we suspect this is the cause of your symptoms.  Your chest x-ray was reassuring.  Your potassium was somewhat low therefore we are sending home with potassium supplement.  We are also sending you with the following medications to help your symptoms:  - Naproxen is a nonsteroidal anti-inflammatory medication that will help with pain and swelling. Be sure to take this medication as prescribed with food, 1 pill every 12 hours,  It should be taken with food, as it can cause stomach upset, and more seriously, stomach bleeding. Do not take other nonsteroidal anti-inflammatory medications with this such as Advil, Motrin, Aleve, Mobic, Goodie Powder, or Motrin.    - Robaxin is the muscle relaxer I have prescribed, this is meant to help with muscle tightness. Be aware that this medication may make you drowsy therefore the first time you take this it should be at a time you are in an environment where you can rest. Do not drive or operate heavy machinery when taking this medication. Do not drink alcohol or take other sedating medications with this medicine such as narcotics or benzodiazepines.   -Tessalon: Take every hours as needed for coughing.  You make take Tylenol per over the counter dosing with these medications.   We have prescribed you new medication(s) today. Discuss the medications prescribed today with your pharmacist as they can have adverse effects and interactions with your other medicines including over the counter and prescribed medications. Seek medical evaluation if you start to experience new or abnormal symptoms after taking one of these medicines, seek care immediately if you start to experience difficulty breathing, feeling of your throat closing, facial swelling, or rash as these could be indications of a more serious allergic reaction   We are instructing patient's with COVID 19 or symptoms of COVID 19 to follow the below instructions regardless of  vaccination status:  - Stay home for 5 days. - If you have no symptoms or your symptoms are resolving after 5 days, you can leave your house--> Continue to wear a mask around others for 5 additional days. - If you have a fever, continue to stay home until your fever resolves.days.   Please follow up with primary care within 3-5 days for re-evaluation- call prior to going to the office to make them aware of your symptoms as some offices are altering their method of seeing patients with COVID 19 symptoms, we have also provided our [place] covid clinic for follow up as well.  Return to the ER for new or worsening symptoms including but not limited to increased work of breathing, chest pain, passing out, or any other concerns.       Person Under Monitoring Name: Teresa Faulkner  Location: [address] [place] [address]   Infection Prevention Recommendations for Individuals Confirmed to have, or Being Evaluated for, 2019 Novel Coronavirus (COVID-19) Infection Who Receive Care at Home  Individuals who are confirmed to have, or are being evaluated for, COVID-19 should follow the prevention steps below until a healthcare provider or local or state health department says they can return to normal activities.  Stay home except to get medical care You should restrict activities outside your home, except for getting medical care. Do not go to work, school, or public areas, and do not use public transportation or taxis.  Call ahead before visiting your doctor Before your medical appointment, call the healthcare provider and tell them that you have,  or are being evaluated for, COVID-19 infection. This will help the healthcare providers office take steps to keep other people from getting infected. Ask your healthcare provider to call the local or state health department.  Monitor your symptoms Seek prompt medical attention if your illness is worsening (e.g., difficulty breathing).  Before going to your medical appointment, call the healthcare provider and tell them that you have, or are being evaluated for, COVID-19 infection. Ask your healthcare provider to call the local or state health department.  Wear a facemask You should wear a facemask that covers your nose and mouth when you are in the same room with other people and when you visit a healthcare provider. People who live with or visit you should also wear a facemask while they are in the same room with you.  Separate yourself from other people in your home As much as possible, you should stay in a different room from other people in your home. Also, you should use a separate bathroom, if available.  Avoid sharing household items You should not share dishes, drinking glasses, cups, eating utensils, towels, bedding, or other items with other people in your home. After using these items, you should wash them thoroughly with soap and water.  Cover your coughs and sneezes Cover your mouth and nose with a tissue when you cough or sneeze, or you can cough or sneeze into your sleeve. Throw used tissues in a lined trash can, and immediately wash your hands with soap and water for at least 20 seconds or use an alcohol-based hand rub.  Wash your [facility] your hands often and thoroughly with soap and water for at least 20 seconds. You can use an alcohol-based hand sanitizer if soap and water are not available and if your hands are not visibly dirty. Avoid touching your eyes, nose, and mouth with unwashed hands.   Prevention Steps for Caregivers and Household Members of Individuals Confirmed to have, or Being Evaluated for, COVID-19 Infection Being Cared for in the Home  If you live with, or provide care at home for, a person confirmed to have, or being evaluated for, COVID-19 infection please follow these guidelines to prevent infection:  Follow healthcare providers instructions Make sure that you understand  and can help the patient follow any healthcare provider instructions for all care.  Provide for the patients basic needs You should help the patient with basic needs in the home and provide support for getting groceries, prescriptions, and other personal needs.  Monitor the patients symptoms If they are getting sicker, call his or her medical provider and tell them that the patient has, or is being evaluated for, COVID-19 infection. This will help the healthcare providers office take steps to keep other people from getting infected. Ask the healthcare provider to call the local or state health department.  Limit the number of people who have contact with the patient If possible, have only one caregiver for the patient. Other household members should stay in another home or place of residence. If this is not possible, they should stay in another room, or be separated from the patient as much as possible. Use a separate bathroom, if available. Restrict visitors who do not have an essential need to be in the home.  Keep older adults, very young children, and other sick people away from the patient Keep older adults, very young children, and those who have compromised immune systems or chronic health conditions away from the patient. This includes people  with chronic heart, lung, or kidney conditions, diabetes, and cancer.  Ensure good ventilation Make sure that shared spaces in the home have good air flow, such as from an air conditioner or an opened window, weather permitting.  Wash your hands often Wash your hands often and thoroughly with soap and water for at least 20 seconds. You can use an alcohol based hand sanitizer if soap and water are not available and if your hands are not visibly dirty. Avoid touching your eyes, nose, and mouth with unwashed hands. Use disposable paper towels to dry your hands. If not available, use dedicated cloth towels and replace them when they become  wet.  Wear a facemask and gloves Wear a disposable facemask at all times in the room and gloves when you touch or have contact with the patients blood, body fluids, and/or secretions or excretions, such as sweat, saliva, sputum, nasal mucus, vomit, urine, or feces.  Ensure the mask fits over your nose and mouth tightly, and do not touch it during use. Throw out disposable facemasks and gloves after using them. Do not reuse. Wash your hands immediately after removing your facemask and gloves. If your personal clothing becomes contaminated, carefully remove clothing and launder. Wash your hands after handling contaminated clothing. Place all used disposable facemasks, gloves, and other waste in a lined container before disposing them with other household waste. Remove gloves and wash your hands immediately after handling these items.  Do not share dishes, glasses, or other household items with the patient Avoid sharing household items. You should not share dishes, drinking glasses, cups, eating utensils, towels, bedding, or other items with a patient who is confirmed to have, or being evaluated for, COVID-19 infection. After the person uses these items, you should wash them thoroughly with soap and water.  Wash laundry thoroughly Immediately remove and wash clothes or bedding that have blood, body fluids, and/or secretions or excretions, such as sweat, saliva, sputum, nasal mucus, vomit, urine, or feces, on them. Wear gloves when handling laundry from the patient. Read and follow directions on labels of laundry or clothing items and detergent. In general, wash and dry with the warmest temperatures recommended on the label.  Clean all areas the individual has used often Clean all touchable surfaces, such as counters, tabletops, doorknobs, bathroom fixtures, toilets, phones, keyboards, tablets, and bedside tables, every day. Also, clean any surfaces that may have blood, body fluids, and/or  secretions or excretions on them. Wear gloves when cleaning surfaces the patient has come in contact with. Use a diluted bleach solution (e.g., dilute bleach with 1 part bleach and 10 parts water) or a household disinfectant with a label that says EPA-registered for coronaviruses. To make a bleach solution at home, add 1 tablespoon of bleach to 1 quart (4 cups) of water. For a larger supply, add  cup of bleach to 1 gallon (16 cups) of water. Read labels of cleaning products and follow recommendations provided on product labels. Labels contain instructions for safe and effective use of the cleaning product including precautions you should take when applying the product, such as wearing gloves or eye protection and making sure you have good ventilation during use of the product. Remove gloves and wash hands immediately after cleaning.  Monitor yourself for signs and symptoms of illness Caregivers and household members are considered close contacts, should monitor their health, and will be asked to limit movement outside of the home to the extent possible. Follow the monitoring steps for close contacts  listed on the symptom monitoring form.   ? If you have additional questions, contact your local health department or call the epidemiologist on call at [phone] (available 24/7). ? This guidance is subject to change. For the most up-to-date guidance from [facility], please refer to their website: [email or web address]

## 2020-05-13 NOTE — ED Triage Notes (Signed)
Patient reports pain under bilateral breast when coughing with emesis onset Tuesday , patient suspects she has Covid19.

## 2020-05-18 ENCOUNTER — Encounter: Payer: Self-pay | Admitting: Nurse Practitioner

## 2020-05-18 ENCOUNTER — Telehealth: Payer: Self-pay

## 2020-05-18 ENCOUNTER — Telehealth (INDEPENDENT_AMBULATORY_CARE_PROVIDER_SITE_OTHER): Payer: BLUE CROSS/BLUE SHIELD | Admitting: Nurse Practitioner

## 2020-05-18 ENCOUNTER — Other Ambulatory Visit: Payer: Self-pay

## 2020-05-18 DIAGNOSIS — K219 Gastro-esophageal reflux disease without esophagitis: Secondary | ICD-10-CM

## 2020-05-18 DIAGNOSIS — R059 Cough, unspecified: Secondary | ICD-10-CM

## 2020-05-18 DIAGNOSIS — Z720 Tobacco use: Secondary | ICD-10-CM | POA: Diagnosis not present

## 2020-05-18 DIAGNOSIS — U071 COVID-19: Secondary | ICD-10-CM | POA: Diagnosis not present

## 2020-05-18 MED ORDER — BUPROPION HCL ER (SR) 150 MG PO TB12
150.0000 mg | ORAL_TABLET | Freq: Every day | ORAL | 2 refills | Status: DC
Start: 2020-05-18 — End: 2021-01-16

## 2020-05-18 MED ORDER — PANTOPRAZOLE SODIUM 20 MG PO TBEC: 20.0000 mg | DELAYED_RELEASE_TABLET | Freq: Every day | ORAL | 1 refills | Status: DC

## 2020-05-18 MED ORDER — ALBUTEROL SULFATE HFA 108 (90 BASE) MCG/ACT IN AERS: 2.0000 | INHALATION_SPRAY | RESPIRATORY_TRACT | 2 refills | Status: DC | PRN

## 2020-05-18 NOTE — Progress Notes (Signed)
Virtual Visit via MyChart   This visit type was conducted due to national recommendations for restrictions regarding the COVID-19 Pandemic (e.g. social distancing) in an effort to limit this patient's exposure and mitigate transmission in our community.  Due to her co-morbid illnesses, this patient is at least at moderate risk for complications without adequate follow up.  This format is felt to be most appropriate for this patient at this time.  All issues noted in this document were discussed and addressed.  A limited physical exam was performed with this format.    This visit type was conducted due to national recommendations for restrictions regarding the COVID-19 Pandemic (e.g. social distancing) in an effort to limit this patient's exposure and mitigate transmission in our community.  Patients identity confirmed using two different identifiers.  This format is felt to be most appropriate for this patient at this time.  All issues noted in this document were discussed and addressed.  No physical exam was performed (except for noted visual exam findings with Video Visits).    Date:  06/23/2020   ID:  Teresa Faulkner, DOB 1966/03/05, MRN [id number]  Patient Location:  Home - spoke with Elberta Spaniel  Provider location:   Office    Chief Complaint:  Positive for covid  History of Present Illness:    Teresa Faulkner is a 55 y.o. female who presents via video conferencing for a telehealth visit today.    The patient does have symptoms concerning for COVID-19 infection (fever, chills, cough, or new shortness of breath).   She took her daughter to the ER on Monday without her mask and then Tuesday 1/4 began with symptoms. She has a hoarse voice, nasal congestion, fatigue, denies fever or headaches (did initially). Her breathing has been up and down with dyspnea with walking.  Coughing with phlegm.  She is using every other day.    She is out of work until January 13th.  She reports a  history of aspirin allergy.      Past Medical History:  Diagnosis Date  . Anxiety   . Arthritis   . Bursitis   . GERD (gastroesophageal reflux disease)   . Hernia, hiatal   . Hx of trichomoniasis 2017  . Migraine    Past Surgical History:  Procedure Laterality Date  . BACK SURGERY    . TUBAL LIGATION       Current Meds  Medication Sig  . [DISCONTINUED] albuterol (VENTOLIN HFA) 108 (90 Base) MCG/ACT inhaler Inhale 2 puffs into the lungs every 4 (four) hours as needed for wheezing or shortness of breath.     Allergies:   Aspirin   Social History   Tobacco Use  . Smoking status: Current Every Day Smoker    Packs/day: 0.25    Years: 20.00    Pack years: 5.00    Types: Cigarettes  . Smokeless tobacco: Never Used  . Tobacco comment: now back on Wellbutrin  Substance Use Topics  . Alcohol use: No  . Drug use: No     Family Hx: The patient's family history includes Breast cancer (age of onset: 71) in an other family member; Diabetes in her maternal aunt and mother; Hypertension in her maternal aunt.  ROS:   Please see the history of present illness.    Review of Systems  Constitutional: Negative.   HENT: Positive for congestion.        Hoarse voice  Respiratory: Negative.   Cardiovascular: Negative.   Neurological:  Negative.     All other systems reviewed and are negative.   Labs/Other Tests and Data Reviewed:    Recent Labs: 06/25/2019: ALT 15 05/13/2020: BUN 9; Creatinine, Ser 0.90; Hemoglobin 12.9; Platelets 224; Potassium 3.4; Sodium 140   Recent Lipid Panel Lab Results  Component Value Date/Time   CHOL 181 06/25/2019 12:31 PM   TRIG 160 (H) 06/25/2019 12:31 PM   HDL 43 06/25/2019 12:31 PM   CHOLHDL 4.2 06/25/2019 12:31 PM   LDLCALC 110 (H) 06/25/2019 12:31 PM    Wt Readings from Last 3 Encounters:  09/09/19 213 lb (96.6 kg)  08/03/19 213 lb (96.6 kg)  07/02/19 218 lb (98.9 kg)     Exam:    Vital Signs:  There were no vitals taken for this  visit.    Physical Exam Vitals reviewed.  Constitutional:      General: She is not in acute distress.    Appearance: Normal appearance.  Pulmonary:     Effort: Pulmonary effort is normal. No respiratory distress.  Neurological:     General: No focal deficit present.     Mental Status: She is alert and oriented to person, place, and time.     Cranial Nerves: No cranial nerve deficit.  Psychiatric:        Mood and Affect: Mood normal.        Behavior: Behavior normal.        Thought Content: Thought content normal.        Judgment: Judgment normal.     ASSESSMENT & PLAN:    1. Cough Will treat with albuterol inhaler as needed If has shortness of breath or chest pain go to ER for further evaluation - albuterol (VENTOLIN HFA) 108 (90 Base) MCG/ACT inhaler; Inhale 2 puffs into the lungs every 4 (four) hours as needed for wheezing or shortness of breath.  Dispense: 18 g; Refill: 2  2. Lab test positive for detection of COVID-19 virus She is to remain out of work until 1/13 - albuterol (VENTOLIN HFA) 108 (90 Base) MCG/ACT inhaler; Inhale 2 puffs into the lungs every 4 (four) hours as needed for wheezing or shortness of breath.  Dispense: 18 g; Refill: 2  3. Gastroesophageal reflux disease without esophagitis Chronic continue with current medications - pantoprazole (PROTONIX) 20 MG tablet; Take 1 tablet (20 mg total) by mouth daily.  Dispense: 90 tablet; Refill: 1  4. Tobacco abuse She is interested in quitting smoking  Will try her on wellbutrin, denies history of seizures Smoking cessation instruction/counseling given:  counseled patient on the dangers of tobacco use, advised patient to stop smoking, and reviewed strategies to maximize success  - buPROPion (WELLBUTRIN SR) 150 MG 12 hr tablet; Take 1 tablet (150 mg total) by mouth daily.  Dispense: 30 tablet; Refill: 2   COVID-19 Education: The signs and symptoms of COVID-19 were discussed with the patient and how to seek care  for testing (follow up with PCP or arrange E-visit).  The importance of social distancing was discussed today.  Patient Risk:   After full review of this patients clinical status, I feel that they are at least moderate risk at this time.  Time:   Today, I have spent 12 minutes/ seconds with the patient with telehealth technology discussing above diagnoses.     Medication Adjustments/Labs and Tests Ordered: Current medicines are reviewed at length with the patient today.  Concerns regarding medicines are outlined above.   Tests Ordered: No orders of the defined types  were placed in this encounter.   Medication Changes: Meds ordered this encounter  Medications  . albuterol (VENTOLIN HFA) 108 (90 Base) MCG/ACT inhaler    Sig: Inhale 2 puffs into the lungs every 4 (four) hours as needed for wheezing or shortness of breath.    Dispense:  18 g    Refill:  2  . buPROPion (WELLBUTRIN SR) 150 MG 12 hr tablet    Sig: Take 1 tablet (150 mg total) by mouth daily.    Dispense:  30 tablet    Refill:  2  . pantoprazole (PROTONIX) 20 MG tablet    Sig: Take 1 tablet (20 mg total) by mouth daily.    Dispense:  90 tablet    Refill:  1    Disposition:  Follow up prn  Signed, Arnette Felts, FNP

## 2020-05-18 NOTE — Telephone Encounter (Signed)
Patient consented to a virtual appt YL,RMA

## 2020-05-20 IMAGING — MG DIGITAL DIAGNOSTIC BILAT W/ TOMO W/ CAD
8 series · 8 of 24 positions shown · non-contrast
Comparison: Previous examinations, including the bilateral
screening mammogram dated 11/21/2012 and abdomen and pelvis CT dated
03/08/2019.

CLINICAL DATA: 1 cm oval mass-like density in the inferior right
breast on the 1st image of a recent abdomen and pelvis CT, not
included in its entirety. Family history of breast cancer in a niece
at age 39.

EXAM:
DIGITAL DIAGNOSTIC BILATERAL MAMMOGRAM WITH CAD AND TOMO
ULTRASOUND RIGHT BREAST

[R CC synth-2D]
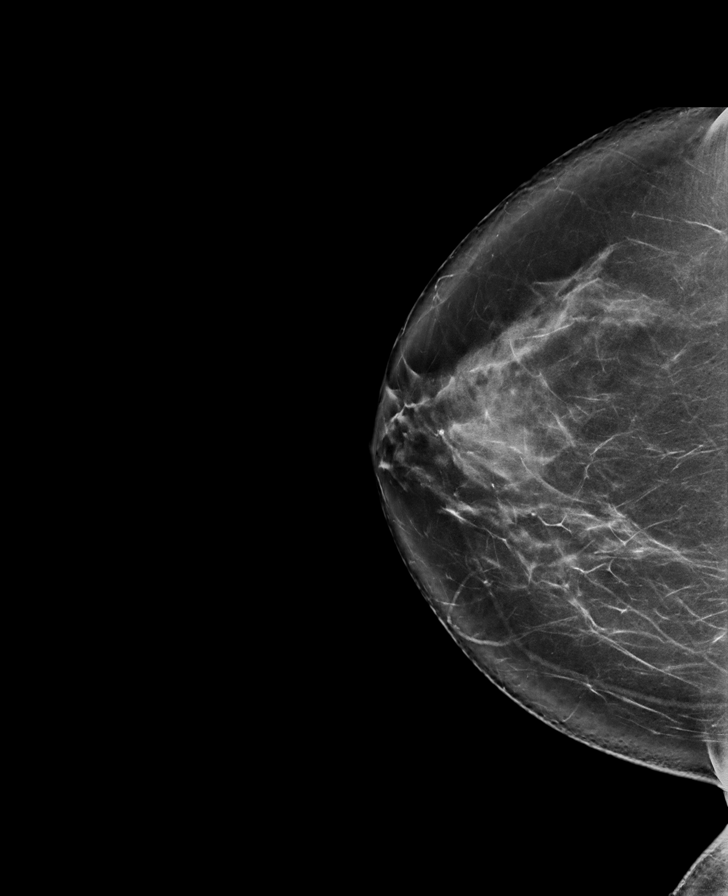

[L MLO synth-2D]
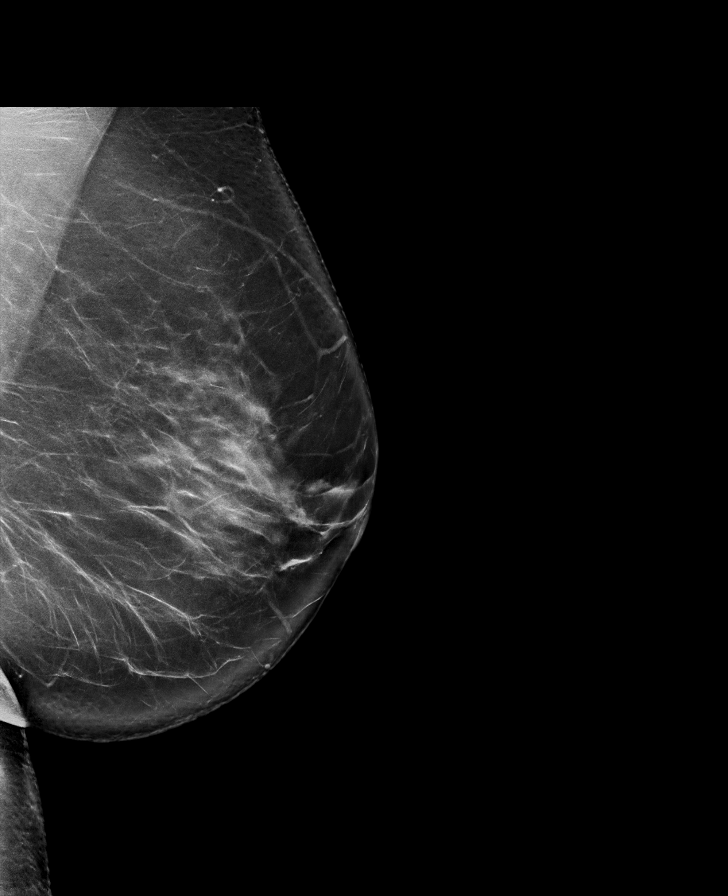

[R MLO synth-2D]
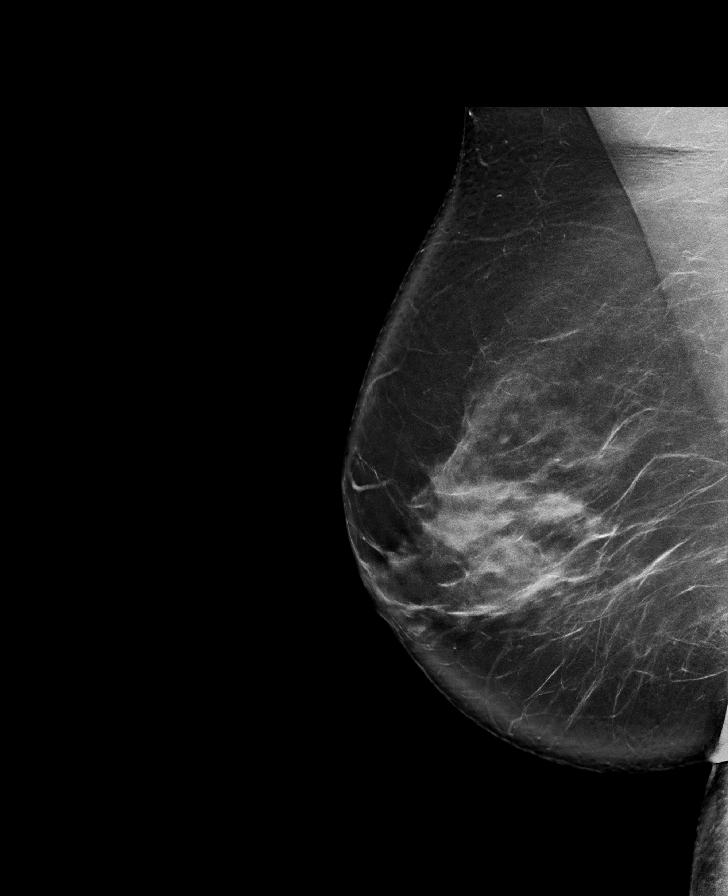

[L CC synth-2D]
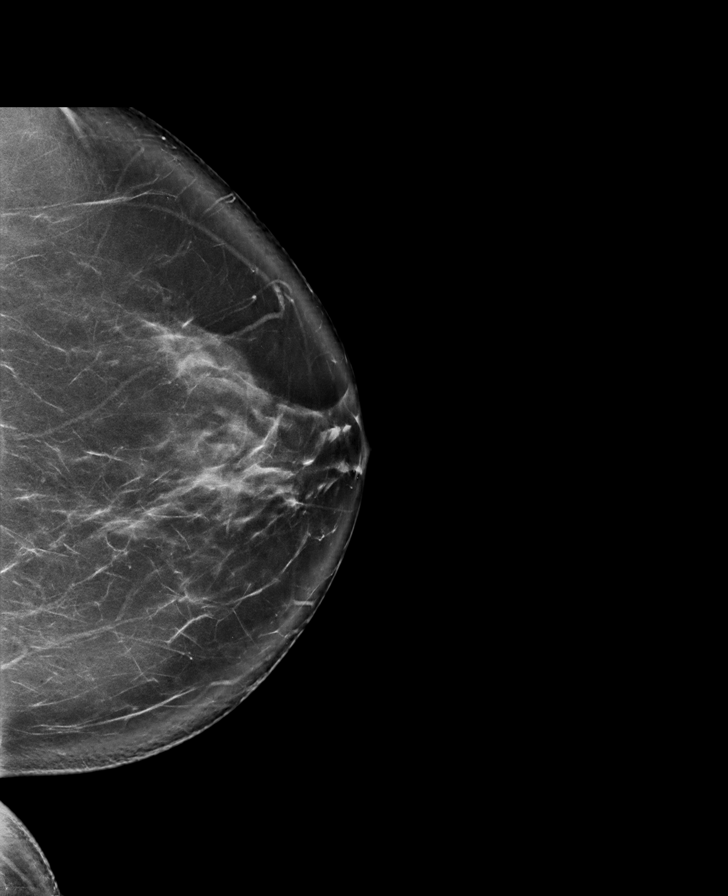

[L MLO tomo · tomo slice 51/102.0]
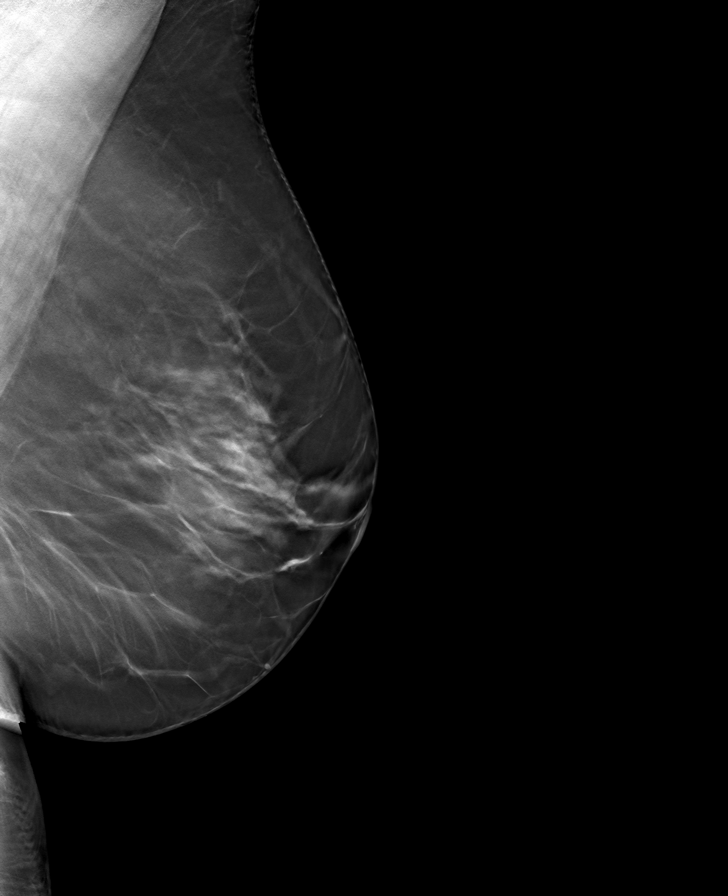

[L CC tomo · tomo slice 50/99.0]
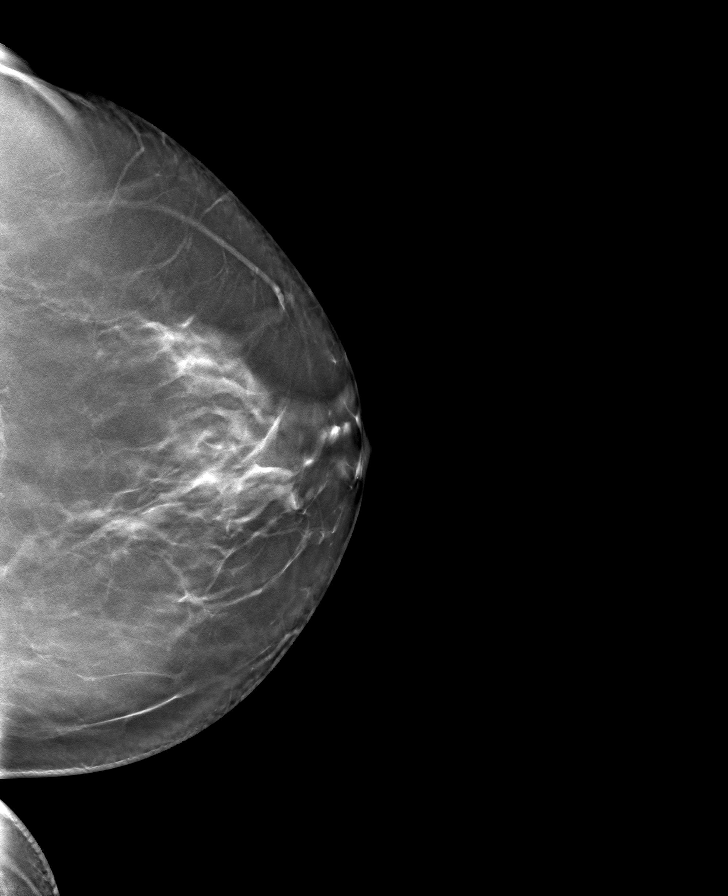

[R CC tomo · tomo slice 49/98.0]
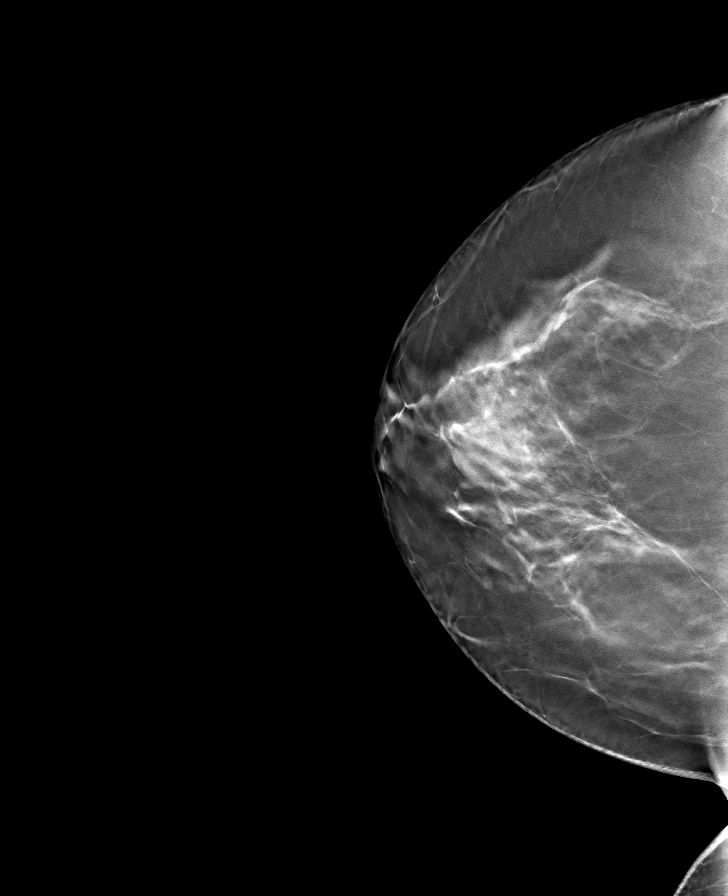

[R MLO tomo · tomo slice 54/107.0]
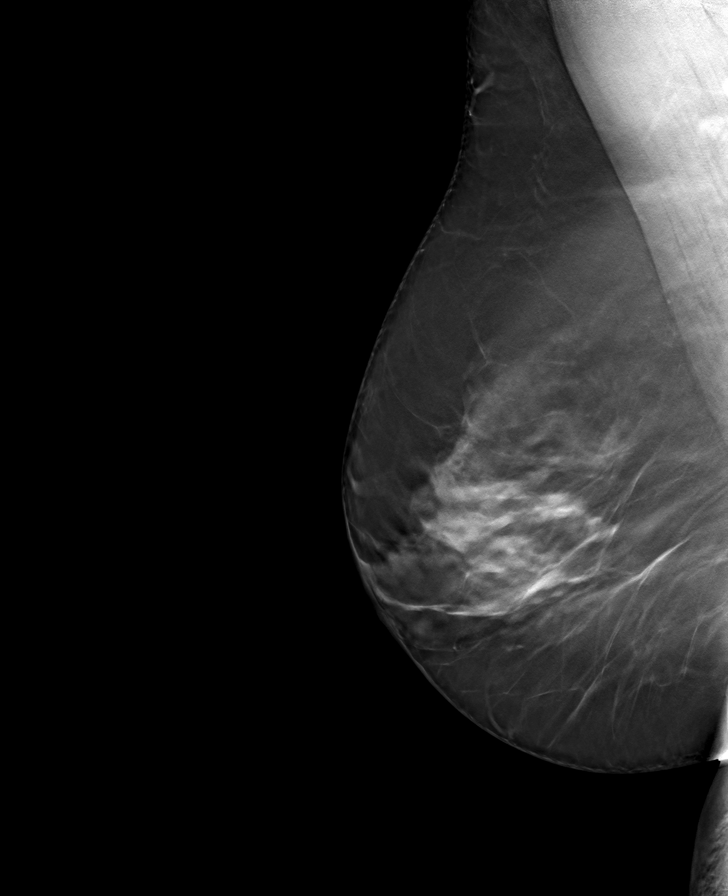

[8 of 24 positions shown; findings below may reference images not displayed]

ACR Breast Density Category c: The breast tissue is heterogeneously
dense, which may obscure small masses.
FINDINGS: Stable mammographic appearance of the breasts with no findings
suspicious for malignancy in either breast. Normal appearing
fibroglandular tissue is demonstrated in the inferior right breast
with no mass seen.

Mammographic images were processed with CAD.

On physical exam, no mass is palpable in the inferior right breast.

Targeted ultrasound is performed, showing normal appearing breast
tissue throughout the inferior right breast. No mass or other
findings suspicious for malignancy were seen.
IMPRESSION: No evidence of malignancy in either breast. The recently suspected
right breast mass was the inferior aspect of normal fibroglandular
tissue.

RECOMMENDATION:
Bilateral screening mammogram in 1 year.

I have discussed the findings and recommendations with the patient.
If applicable, a reminder letter will be sent to the patient
regarding the next appointment.

BI-RADS CATEGORY  1: Negative.

## 2020-06-23 ENCOUNTER — Encounter: Payer: Self-pay | Admitting: Nurse Practitioner

## 2020-09-15 ENCOUNTER — Other Ambulatory Visit: Payer: Self-pay | Admitting: Physician Assistant

## 2020-09-15 DIAGNOSIS — M545 Low back pain, unspecified: Secondary | ICD-10-CM

## 2020-10-05 LAB — HM COLONOSCOPY

## 2021-01-15 ENCOUNTER — Other Ambulatory Visit: Payer: Self-pay

## 2021-01-15 ENCOUNTER — Emergency Department (HOSPITAL_COMMUNITY)
Admission: EM | Admit: 2021-01-15 | Discharge: 2021-01-16 | Disposition: A | Discharge status: Home / Self Care | Payer: BLUE CROSS/BLUE SHIELD | Attending: Emergency Medicine | Admitting: Emergency Medicine

## 2021-01-15 ENCOUNTER — Encounter (HOSPITAL_COMMUNITY): Payer: Self-pay | Admitting: Emergency Medicine

## 2021-01-15 DIAGNOSIS — M549 Dorsalgia, unspecified: Secondary | ICD-10-CM | POA: Diagnosis not present

## 2021-01-15 DIAGNOSIS — F17210 Nicotine dependence, cigarettes, uncomplicated: Secondary | ICD-10-CM | POA: Insufficient documentation

## 2021-01-15 DIAGNOSIS — Z5321 Procedure and treatment not carried out due to patient leaving prior to being seen by health care provider: Secondary | ICD-10-CM | POA: Insufficient documentation

## 2021-01-15 NOTE — ED Triage Notes (Signed)
Patient coming from home, complaint of back pain, pt with hx of same, had surgery and told she re-injured her back in June. VSS.

## 2021-01-16 ENCOUNTER — Other Ambulatory Visit: Payer: Self-pay

## 2021-01-16 ENCOUNTER — Emergency Department (HOSPITAL_BASED_OUTPATIENT_CLINIC_OR_DEPARTMENT_OTHER)
Admission: EM | Admit: 2021-01-16 | Discharge: 2021-01-16 | Disposition: A | Discharge status: Home / Self Care | Payer: BLUE CROSS/BLUE SHIELD | Source: Home / Self Care | Attending: Emergency Medicine | Admitting: Emergency Medicine

## 2021-01-16 ENCOUNTER — Encounter (HOSPITAL_BASED_OUTPATIENT_CLINIC_OR_DEPARTMENT_OTHER): Payer: Self-pay | Admitting: *Deleted

## 2021-01-16 DIAGNOSIS — M545 Low back pain, unspecified: Secondary | ICD-10-CM | POA: Insufficient documentation

## 2021-01-16 DIAGNOSIS — F17210 Nicotine dependence, cigarettes, uncomplicated: Secondary | ICD-10-CM | POA: Insufficient documentation

## 2021-01-16 MED ORDER — KETOROLAC TROMETHAMINE 60 MG/2ML IM SOLN
60.0000 mg | Freq: Once | INTRAMUSCULAR | Status: AC
  Administered 2021-01-16: 60 mg via INTRAMUSCULAR
  Filled 2021-01-16: qty 2

## 2021-01-16 MED ORDER — ONDANSETRON 4 MG PO TBDP
8.0000 mg | ORAL_TABLET | Freq: Once | ORAL | Status: AC
  Administered 2021-01-16: 8 mg via ORAL
  Filled 2021-01-16: qty 2

## 2021-01-16 MED ORDER — METHOCARBAMOL 500 MG PO TABS: 500.0000 mg | ORAL_TABLET | Freq: Three times a day (TID) | ORAL | 0 refills | Status: DC | PRN

## 2021-01-16 MED ORDER — HYDROMORPHONE HCL 1 MG/ML IJ SOLN
1.0000 mg | Freq: Once | INTRAMUSCULAR | Status: AC
  Administered 2021-01-16: 1 mg via INTRAMUSCULAR
  Filled 2021-01-16: qty 1

## 2021-01-16 MED ORDER — HYDROCODONE-ACETAMINOPHEN 5-325 MG PO TABS: 1.0000 | ORAL_TABLET | ORAL | 0 refills | Status: DC | PRN

## 2021-01-16 NOTE — ED Provider Notes (Signed)
[facility] EMERGENCY DEPT Provider Note   CSN: [id number] Arrival date & time: 01/16/21  0019     History Chief Complaint  Patient presents with   Back Pain    Teresa Faulkner is a 55 y.o. female.  Patient presents to the emergency department for evaluation of back pain.  Patient reports that she has a history of chronic back pain, has had previous back surgery.  Patient experiencing increased back pain over the last couple of days.  Pain is more to the right side, does not radiate to the legs.  No loss of strength, sensation in lower extremities.  No change in bowel or bladder function.  She has not had any recent injury.  Patient has tried over-the-counter Aleve without improvement.      Past Medical History:  Diagnosis Date   Anxiety    Arthritis    Bursitis    GERD (gastroesophageal reflux disease)    Hernia, hiatal    Hx of trichomoniasis 2017   Migraine     Patient Active Problem List   Diagnosis Date Noted   Urinary frequency 06/25/2019   Change in bowel movement 05/28/2019   Gastroesophageal reflux disease without esophagitis 05/28/2019   Encounter for screening for metabolic disorder 05/28/2019   Low back pain with left-sided sciatica 10/11/2016   Abdominal pain 05/22/2016   Hiatal hernia 05/22/2016   Chronic headaches 05/22/2016   Menopause 05/22/2016    Past Surgical History:  Procedure Laterality Date   BACK SURGERY     TUBAL LIGATION       OB History     Gravida  2   Para  2   Term  2   Preterm  0   AB  0   Living  2      SAB  0   IAB  0   Ectopic  0   Multiple  0   Live Births              Family History  Problem Relation Age of Onset   Diabetes Mother    Diabetes Maternal Aunt    Hypertension Maternal Aunt    Breast cancer Other 35       Stage IV    Social History   Tobacco Use   Smoking status: Every Day    Packs/day: 0.25    Years: 20.00    Pack years: 5.00    Types: Cigarettes    Smokeless tobacco: Never   Tobacco comments:    now back on Wellbutrin  Substance Use Topics   Alcohol use: No   Drug use: No    Home Medications Prior to Admission medications   Medication Sig Start Date End Date Taking? Authorizing Provider  HYDROcodone-acetaminophen (NORCO/VICODIN) 5-325 MG tablet Take 1 tablet by mouth every 4 (four) hours as needed for moderate pain. 01/16/21  Yes Primus Gritton, Canary Brim, MD  methocarbamol (ROBAXIN) 500 MG tablet Take 1 tablet (500 mg total) by mouth every 8 (eight) hours as needed for muscle spasms. 01/16/21  Yes Fronia Depass, Canary Brim, MD    Allergies    Aspirin  Review of Systems   Review of Systems  Musculoskeletal:  Positive for back pain.  All other systems reviewed and are negative.  Physical Exam Updated Vital Signs BP (!) 149/71 (BP Location: Right Arm)   Pulse 64   Temp 97.6 F (36.4 C) (Oral)   Resp 20   Ht 5\' 9"  (1.753 m)   Wt 98  kg   SpO2 96%   BMI 31.90 kg/m   Physical Exam Vitals and nursing note reviewed.  Constitutional:      General: She is not in acute distress.    Appearance: Normal appearance. She is well-developed.  HENT:     Head: Normocephalic and atraumatic.     Right Ear: Hearing normal.     Left Ear: Hearing normal.     Nose: Nose normal.  Eyes:     Conjunctiva/sclera: Conjunctivae normal.     Pupils: Pupils are equal, round, and reactive to light.  Cardiovascular:     Rate and Rhythm: Regular rhythm.     Heart sounds: S1 normal and S2 normal. No murmur heard.   No friction rub. No gallop.  Pulmonary:     Effort: Pulmonary effort is normal. No respiratory distress.     Breath sounds: Normal breath sounds.  Chest:     Chest wall: No tenderness.  Abdominal:     General: Bowel sounds are normal.     Palpations: Abdomen is soft.     Tenderness: There is no abdominal tenderness. There is no guarding or rebound. Negative signs include Murphy's sign and McBurney's sign.     Hernia: No hernia is  present.  Musculoskeletal:     Cervical back: Normal range of motion and neck supple.     Lumbar back: Negative right straight leg raise test and negative left straight leg raise test.  Skin:    General: Skin is warm and dry.     Findings: No rash.  Neurological:     Mental Status: She is alert and oriented to person, place, and time.     GCS: GCS eye subscore is 4. GCS verbal subscore is 5. GCS motor subscore is 6.     Cranial Nerves: No cranial nerve deficit.     Sensory: No sensory deficit.     Coordination: Coordination normal.  Psychiatric:        Speech: Speech normal.        Behavior: Behavior normal.        Thought Content: Thought content normal.    ED Results / Procedures / Treatments   Labs (all labs ordered are listed, but only abnormal results are displayed) Labs Reviewed - No data to display  EKG None  Radiology No results found.  Procedures Procedures   Medications Ordered in ED Medications  ketorolac (TORADOL) injection 60 mg (has no administration in time range)  HYDROmorphone (DILAUDID) injection 1 mg (has no administration in time range)  ondansetron (ZOFRAN-ODT) disintegrating tablet 8 mg (has no administration in time range)    ED Course  I have reviewed the triage vital signs and the nursing notes.  Pertinent labs & imaging results that were available during my care of the patient were reviewed by me and considered in my medical decision making (see chart for details).    MDM Rules/Calculators/A&P                           Patient presents with low back pain.  She has a history of chronic back pain, has had previous surgery.  No red flags.  Patient does not have saddle anesthesia or foot drop.  Pain does not radiate down the leg.  Raising her leg on the right side does reproduce back pain but no radicular pain.  Final Clinical Impression(s) / ED Diagnoses Final diagnoses:  Acute low back pain without sciatica,  unspecified back pain  laterality    Rx / DC Orders ED Discharge Orders          Ordered    HYDROcodone-acetaminophen (NORCO/VICODIN) 5-325 MG tablet  Every 4 hours PRN        01/16/21 0125    methocarbamol (ROBAXIN) 500 MG tablet  Every 8 hours PRN        01/16/21 0125             Gilda Crease, MD 01/16/21 0126

## 2021-01-16 NOTE — ED Triage Notes (Addendum)
Pt c/o right lower back pain. Pt states hx of the same. States she has had surgery on her and re injured her back in June. Denies any recent injury. Denies any urinary symptoms, tingling, or loss of control of bowels. Pt has a ride home. Took aleve at 5pm along with heat and ice with no relief.

## 2021-04-29 IMAGING — DX DG CHEST 1V PORT
1 series · 1 of 1 positions shown · non-contrast
Comparison: Prior chest radiographs 08/03/2019.

CLINICAL DATA: Cough. Additional history provided: Patient reports
pain under bilateral breasts when coughing with emesis onset
[REDACTED], patient suspects she has YQXCP-CH.

EXAM:
PORTABLE CHEST 1 VIEW

[chest ap]
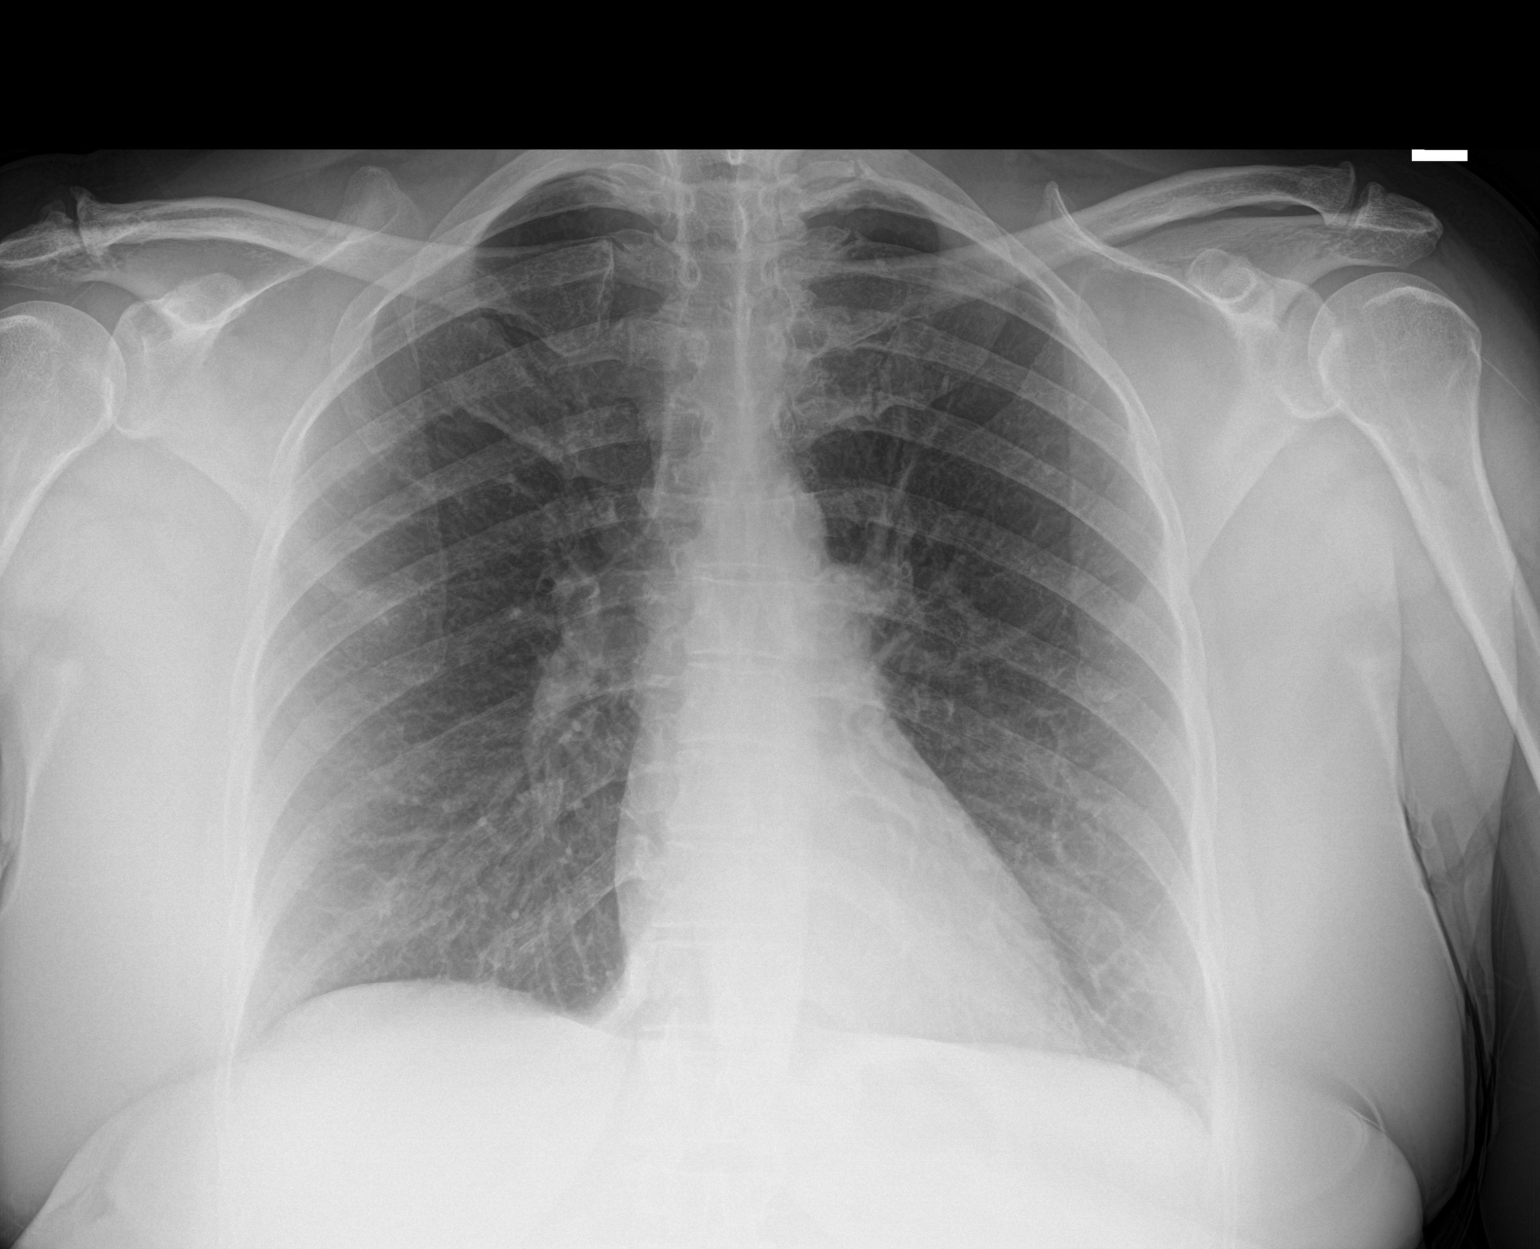

[1 of 1 positions shown; findings below may reference images not displayed]

FINDINGS: Heart size within normal limits. No appreciable airspace
consolidation. No evidence of pleural effusion or pneumothorax. No
acute bony abnormality identified.
IMPRESSION: No evidence of active cardiopulmonary disease.

## 2021-07-09 ENCOUNTER — Ambulatory Visit (HOSPITAL_COMMUNITY)
Admission: EM | Admit: 2021-07-09 | Discharge: 2021-07-09 | Disposition: A | Discharge status: Home / Self Care | Payer: 59 | Attending: Urgent Care | Admitting: Urgent Care

## 2021-07-09 ENCOUNTER — Other Ambulatory Visit: Payer: Self-pay

## 2021-07-09 ENCOUNTER — Encounter (HOSPITAL_COMMUNITY): Payer: Self-pay | Admitting: Emergency Medicine

## 2021-07-09 DIAGNOSIS — Z20822 Contact with and (suspected) exposure to covid-19: Secondary | ICD-10-CM | POA: Insufficient documentation

## 2021-07-09 DIAGNOSIS — R059 Cough, unspecified: Secondary | ICD-10-CM | POA: Insufficient documentation

## 2021-07-09 DIAGNOSIS — F17200 Nicotine dependence, unspecified, uncomplicated: Secondary | ICD-10-CM | POA: Diagnosis not present

## 2021-07-09 DIAGNOSIS — J4530 Mild persistent asthma, uncomplicated: Secondary | ICD-10-CM | POA: Diagnosis not present

## 2021-07-09 DIAGNOSIS — R0982 Postnasal drip: Secondary | ICD-10-CM | POA: Diagnosis not present

## 2021-07-09 DIAGNOSIS — J069 Acute upper respiratory infection, unspecified: Secondary | ICD-10-CM

## 2021-07-09 LAB — SARS CORONAVIRUS 2 (TAT 6-24 HRS): SARS Coronavirus 2: NEGATIVE

## 2021-07-09 MED ORDER — LEVOCETIRIZINE DIHYDROCHLORIDE 5 MG PO TABS: 5.0000 mg | ORAL_TABLET | Freq: Every evening | ORAL | 0 refills | Status: DC

## 2021-07-09 MED ORDER — PROMETHAZINE-DM 6.25-15 MG/5ML PO SYRP: 5.0000 mL | ORAL_SOLUTION | Freq: Four times a day (QID) | ORAL | 0 refills | Status: DC | PRN

## 2021-07-09 MED ORDER — ALBUTEROL SULFATE HFA 108 (90 BASE) MCG/ACT IN AERS: 1.0000 | INHALATION_SPRAY | Freq: Four times a day (QID) | RESPIRATORY_TRACT | 0 refills | Status: DC | PRN

## 2021-07-09 MED ORDER — PREDNISONE 50 MG PO TABS: 50.0000 mg | ORAL_TABLET | Freq: Every day | ORAL | 0 refills | Status: DC

## 2021-07-09 MED ORDER — METHYLPREDNISOLONE SODIUM SUCC 125 MG IJ SOLR
125.0000 mg | Freq: Once | INTRAMUSCULAR | Status: AC
  Administered 2021-07-09: 125 mg via INTRAMUSCULAR

## 2021-07-09 MED ORDER — METHYLPREDNISOLONE SODIUM SUCC 125 MG IJ SOLR
INTRAMUSCULAR | Status: AC
  Filled 2021-07-09: qty 2

## 2021-07-09 NOTE — ED Provider Notes (Signed)
?[facility] ? ? ?MRN: [id number] DOB: 1965/09/04 ? ?Subjective:  ? ?Teresa Faulkner is a 56 y.o. female presenting for 3 day history of acute onset persistent worsening dry hacking cough. Symptoms are worse at night.  Patient has also started to have a scratchy throat, headache.  Has had 2 sick contacts with her grandchildren.  No chest pain, shortness of breath or wheezing.  She does have a history of asthma.  She is also a smoker.  Does not want to be tested for COVID as her grandkids were tested and they were negative.  No confusion, weakness, numbness or tingling.  No history of high blood pressure. ? ?No current facility-administered medications for this encounter. ? ?Current Outpatient Medications:  ?  HYDROcodone-acetaminophen (NORCO/VICODIN) 5-325 MG tablet, Take 1 tablet by mouth every 4 (four) hours as needed for moderate pain., Disp: 15 tablet, Rfl: 0 ?  methocarbamol (ROBAXIN) 500 MG tablet, Take 1 tablet (500 mg total) by mouth every 8 (eight) hours as needed for muscle spasms., Disp: 20 tablet, Rfl: 0  ? ?Allergies  ?Allergen Reactions  ? Aspirin Other (See Comments)  ?  unknown  ? ? ?Past Medical History:  ?Diagnosis Date  ? Anxiety   ? Arthritis   ? Bursitis   ? GERD (gastroesophageal reflux disease)   ? Hernia, hiatal   ? Hx of trichomoniasis 2017  ? Migraine   ?  ? ?Past Surgical History:  ?Procedure Laterality Date  ? BACK SURGERY    ? TUBAL LIGATION    ? ? ?Family History  ?Problem Relation Age of Onset  ? Diabetes Mother   ? Diabetes Maternal Aunt   ? Hypertension Maternal Aunt   ? Breast cancer Other 35  ?     Stage IV  ? ? ?Social History  ? ?Tobacco Use  ? Smoking status: Every Day  ?  Packs/day: 0.25  ?  Years: 20.00  ?  Pack years: 5.00  ?  Types: Cigarettes  ? Smokeless tobacco: Never  ? Tobacco comments:  ?  now back on Wellbutrin  ?Substance Use Topics  ? Alcohol use: No  ? Drug use: No  ? ? ?ROS ? ? ?Objective:  ? ?Vitals: ?BP (!) 153/73 (BP Location: Left Arm)    Pulse 80   Temp 98.8 ?F (37.1 ?C) (Oral)   Resp 18   Ht 5\' 9"  (1.753 m)   Wt 216 lb 0.8 oz (98 kg)   SpO2 99%   BMI 31.91 kg/m?  ? ?Physical Exam ?Constitutional:   ?   General: She is not in acute distress. ?   Appearance: Normal appearance. She is well-developed and normal weight. She is not ill-appearing, toxic-appearing or diaphoretic.  ?HENT:  ?   Head: Normocephalic and atraumatic.  ?   Right Ear: Tympanic membrane, ear canal and external ear normal. No drainage or tenderness. No middle ear effusion. There is no impacted cerumen. Tympanic membrane is not erythematous.  ?   Left Ear: Tympanic membrane, ear canal and external ear normal. No drainage or tenderness.  No middle ear effusion. There is no impacted cerumen. Tympanic membrane is not erythematous.  ?   Nose: Congestion present. No rhinorrhea.  ?   Mouth/Throat:  ?   Mouth: Mucous membranes are moist. No oral lesions.  ?   Pharynx: No pharyngeal swelling, oropharyngeal exudate, posterior oropharyngeal erythema or uvula swelling.  ?   Tonsils: No tonsillar exudate or tonsillar abscesses.  ?  Comments: Cobblestone drainage overlying the pharynx. ?Eyes:  ?   General: No scleral icterus.    ?   Right eye: No discharge.     ?   Left eye: No discharge.  ?   Extraocular Movements: Extraocular movements intact.  ?   Right eye: Normal extraocular motion.  ?   Left eye: Normal extraocular motion.  ?   Conjunctiva/sclera: Conjunctivae normal.  ?Neck:  ?   Meningeal: Brudzinski's sign and Kernig's sign absent.  ?Cardiovascular:  ?   Rate and Rhythm: Normal rate.  ?   Heart sounds: No murmur heard. ?  No friction rub. No gallop.  ?Pulmonary:  ?   Effort: Pulmonary effort is normal. No respiratory distress.  ?   Breath sounds: No stridor. No wheezing, rhonchi or rales.  ?   Comments: Persistent coughing throughout visit.  Patient is able to speak in full sentences however, is not using accessory muscles.  No cyanotic lips. ?Chest:  ?   Chest wall: No  tenderness.  ?Musculoskeletal:  ?   Cervical back: Normal range of motion and neck supple. No rigidity.  ?Lymphadenopathy:  ?   Cervical: No cervical adenopathy.  ?Skin: ?   General: Skin is warm and dry.  ?Neurological:  ?   General: No focal deficit present.  ?   Mental Status: She is alert and oriented to person, place, and time.  ?   Cranial Nerves: No cranial nerve deficit, dysarthria or facial asymmetry.  ?   Motor: No weakness.  ?   Coordination: Romberg sign negative. Coordination normal.  ?   Gait: Gait normal.  ?   Comments: Negative pronator drift.  ?Psychiatric:     ?   Mood and Affect: Mood normal.     ?   Behavior: Behavior normal.  ? ? ? ? ?Assessment and Plan :  ? ?PDMP not reviewed this encounter. ? ?1. Viral URI with cough   ?2. Post-nasal drainage   ?3. Mild persistent asthma without complication   ?4. Smoker   ? ?Patient declined respiratory testing. Deferred imaging given clear cardiopulmonary exam, hemodynamically stable vital signs.  In the context of her history of asthma, smoking, offered her an oral prednisone course.  Use supportive care otherwise.  Refilled her albuterol inhaler. Counseled patient on potential for adverse effects with medications prescribed/recommended today, ER and return-to-clinic precautions discussed, patient verbalized understanding. ? ?  ?Wallis Bamberg, PA-C ?07/09/21 1049 ? ?

## 2021-07-09 NOTE — ED Triage Notes (Signed)
Pt reports a dry cough that has gotten progressively worse since Thursday and headache beginning lastnight.  ?

## 2021-07-14 ENCOUNTER — Ambulatory Visit: Payer: 59

## 2021-07-14 ENCOUNTER — Other Ambulatory Visit: Payer: Self-pay

## 2021-07-14 ENCOUNTER — Ambulatory Visit: Payer: 59 | Admitting: Podiatry

## 2021-07-14 ENCOUNTER — Ambulatory Visit (INDEPENDENT_AMBULATORY_CARE_PROVIDER_SITE_OTHER): Payer: 59

## 2021-07-14 DIAGNOSIS — M7730 Calcaneal spur, unspecified foot: Secondary | ICD-10-CM | POA: Diagnosis not present

## 2021-07-14 DIAGNOSIS — M79671 Pain in right foot: Secondary | ICD-10-CM

## 2021-07-14 DIAGNOSIS — M2021 Hallux rigidus, right foot: Secondary | ICD-10-CM

## 2021-07-14 DIAGNOSIS — M79672 Pain in left foot: Secondary | ICD-10-CM | POA: Diagnosis not present

## 2021-07-14 DIAGNOSIS — M2022 Hallux rigidus, left foot: Secondary | ICD-10-CM

## 2021-07-14 NOTE — Progress Notes (Signed)
Patient seen for foot orthotic evaluation. Patient requested a pre-certification to determine out of pocket estimate. Patient will be contacted within a week with results so she may make an informed decision and schedule her orthotic casting. ?

## 2021-07-14 NOTE — Patient Instructions (Addendum)
If was nice to meet you today. If you have any questions or any further concerns, please feel fee to give me a call. You can call our office at [phone] or please feel fee to send me a message through MyChart.  ---  You can use voltaren gel on the heel spurs  ---  Achilles Tendinitis  with Rehab Achilles tendinitis is a disorder of the Achilles tendon. The Achilles tendon connects the large calf muscles (Gastrocnemius and Soleus) to the heel bone (calcaneus). This tendon is sometimes called the heel cord. It is important for pushing-off and standing on your toes and is important for walking, running, or jumping. Tendinitis is often caused by overuse and repetitive microtrauma. SYMPTOMS Pain, tenderness, swelling, warmth, and redness may occur over the Achilles tendon even at rest. Pain with pushing off, or flexing or extending the ankle. Pain that is worsened after or during activity. CAUSES  Overuse sometimes seen with rapid increase in exercise programs or in sports requiring running and jumping. Poor physical conditioning (strength and flexibility or endurance). Running sports, especially training running down hills. Inadequate warm-up before practice or play or failure to stretch before participation. Injury to the tendon. PREVENTION  Warm up and stretch before practice or competition. Allow time for adequate rest and recovery between practices and competition. Keep up conditioning. Keep up ankle and leg flexibility. Improve or keep muscle strength and endurance. Improve cardiovascular fitness. Use proper technique. Use proper equipment (shoes, skates). To help prevent recurrence, taping, protective strapping, or an adhesive bandage may be recommended for several weeks after healing is complete. PROGNOSIS  Recovery may take weeks to several months to heal. Longer recovery is expected if symptoms have been prolonged. Recovery is usually quicker if the inflammation is due to a  direct blow as compared with overuse or sudden strain. RELATED COMPLICATIONS  Healing time will be prolonged if the condition is not correctly treated. The injury must be given plenty of time to heal. Symptoms can reoccur if activity is resumed too soon. Untreated, tendinitis may increase the risk of tendon rupture requiring additional time for recovery and possibly surgery. TREATMENT  The first treatment consists of rest anti-inflammatory medication, and ice to relieve the pain. Stretching and strengthening exercises after resolution of pain will likely help reduce the risk of recurrence. Referral to a physical therapist or athletic trainer for further evaluation and treatment may be helpful. A walking boot or cast may be recommended to rest the Achilles tendon. This can help break the cycle of inflammation and microtrauma. Arch supports (orthotics) may be prescribed or recommended by your caregiver as an adjunct to therapy and rest. Surgery to remove the inflamed tendon lining or degenerated tendon tissue is rarely necessary and has shown less than predictable results. MEDICATION  Nonsteroidal anti-inflammatory medications, such as aspirin and ibuprofen, may be used for pain and inflammation relief. Do not take within 7 days before surgery. Take these as directed by your caregiver. Contact your caregiver immediately if any bleeding, stomach upset, or signs of allergic reaction occur. Other minor pain relievers, such as acetaminophen, may also be used. Pain relievers may be prescribed as necessary by your caregiver. Do not take prescription pain medication for longer than 4 to 7 days. Use only as directed and only as much as you need. Cortisone injections are rarely indicated. Cortisone injections may weaken tendons and predispose to rupture. It is better to give the condition more time to heal than to use them. HEAT AND  COLD Cold is used to relieve pain and reduce inflammation for acute and chronic  Achilles tendinitis. Cold should be applied for 10 to 15 minutes every 2 to 3 hours for inflammation and pain and immediately after any activity that aggravates your symptoms. Use ice packs or an ice massage. Heat may be used before performing stretching and strengthening activities prescribed by your caregiver. Use a heat pack or a warm soak. SEEK MEDICAL CARE IF: Symptoms get worse or do not improve in 2 weeks despite treatment. New, unexplained symptoms develop. Drugs used in treatment may produce side effects.  EXERCISES:  RANGE OF MOTION (ROM) AND STRETCHING EXERCISES - Achilles Tendinitis  These exercises may help you when beginning to rehabilitate your injury. Your symptoms may resolve with or without further involvement from your physician, physical therapist or athletic trainer. While completing these exercises, remember:  Restoring tissue flexibility helps normal motion to return to the joints. This allows healthier, less painful movement and activity. An effective stretch should be held for at least 30 seconds. A stretch should never be painful. You should only feel a gentle lengthening or release in the stretched tissue.  STRETCH  Gastroc, Standing  Place hands on wall. Extend right / left leg, keeping the front knee somewhat bent. Slightly point your toes inward on your back foot. Keeping your right / left heel on the floor and your knee straight, shift your weight toward the wall, not allowing your back to arch. You should feel a gentle stretch in the right / left calf. Hold this position for 10 seconds. Repeat 3 times. Complete this stretch 2 times per day.  STRETCH  Soleus, Standing  Place hands on wall. Extend right / left leg, keeping the other knee somewhat bent. Slightly point your toes inward on your back foot. Keep your right / left heel on the floor, bend your back knee, and slightly shift your weight over the back leg so that you feel a gentle stretch deep in your  back calf. Hold this position for 10 seconds. Repeat 3 times. Complete this stretch 2 times per day.  STRETCH  Gastrocsoleus, Standing  Note: This exercise can place a lot of stress on your foot and ankle. Please complete this exercise only if specifically instructed by your caregiver.  Place the ball of your right / left foot on a step, keeping your other foot firmly on the same step. Hold on to the wall or a rail for balance. Slowly lift your other foot, allowing your body weight to press your heel down over the edge of the step. You should feel a stretch in your right / left calf. Hold this position for 10 seconds. Repeat this exercise with a slight bend in your knee. Repeat 3 times. Complete this stretch 2 times per day.   STRENGTHENING EXERCISES - Achilles Tendinitis These exercises may help you when beginning to rehabilitate your injury. They may resolve your symptoms with or without further involvement from your physician, physical therapist or athletic trainer. While completing these exercises, remember:  Muscles can gain both the endurance and the strength needed for everyday activities through controlled exercises. Complete these exercises as instructed by your physician, physical therapist or athletic trainer. Progress the resistance and repetitions only as guided. You may experience muscle soreness or fatigue, but the pain or discomfort you are trying to eliminate should never worsen during these exercises. If this pain does worsen, stop and make certain you are following the directions exactly. If  the pain is still present after adjustments, discontinue the exercise until you can discuss the trouble with your clinician.  STRENGTH - Plantar-flexors  Sit with your right / left leg extended. Holding onto both ends of a rubber exercise band/tubing, loop it around the ball of your foot. Keep a slight tension in the band. Slowly push your toes away from you, pointing them downward. Hold  this position for 10 seconds. Return slowly, controlling the tension in the band/tubing. Repeat 3 times. Complete this exercise 2 times per day.   STRENGTH - Plantar-flexors  Stand with your feet shoulder width apart. Steady yourself with a wall or table using as little support as needed. Keeping your weight evenly spread over the width of your feet, rise up on your toes.* Hold this position for 10 seconds. Repeat 3 times. Complete this exercise 2 times per day.  *If this is too easy, shift your weight toward your right / left leg until you feel challenged. Ultimately, you may be asked to do this exercise with your right / left foot only.  STRENGTH  Plantar-flexors, Eccentric  Note: This exercise can place a lot of stress on your foot and ankle. Please complete this exercise only if specifically instructed by your caregiver.  Place the balls of your feet on a step. With your hands, use only enough support from a wall or rail to keep your balance. Keep your knees straight and rise up on your toes. Slowly shift your weight entirely to your right / left toes and pick up your opposite foot. Gently and with controlled movement, lower your weight through your right / left foot so that your heel drops below the level of the step. You will feel a slight stretch in the back of your calf at the end position. Use the healthy leg to help rise up onto the balls of both feet, then lower weight only on the right / left leg again. Build up to 15 repetitions. Then progress to 3 consecutive sets of 15 repetitions.* After completing the above exercise, complete the same exercise with a slight knee bend (about 30 degrees). Again, build up to 15 repetitions. Then progress to 3 consecutive sets of 15 repetitions.* Perform this exercise 2 times per day.  *When you easily complete 3 sets of 15, your physician, physical therapist or athletic trainer may advise you to add resistance by wearing a backpack filled with  additional weight.  STRENGTH - Plantar Flexors, Seated  Sit on a chair that allows your feet to rest flat on the ground. If necessary, sit at the edge of the chair. Keeping your toes firmly on the ground, lift your right / left heel as far as you can without increasing any discomfort in your ankle. Repeat 3 times. Complete this exercise 2 times a day.

## 2021-07-17 NOTE — Progress Notes (Signed)
Subjective:  ? ?Patient ID: Teresa Faulkner, female   DOB: 56 y.o.   MRN: [id number]  ? ?HPI ?56 year old female presents the office today for concerns of pain to the back of her heel, ankle which has been ongoing for the last 2 years.  She is got stabbing, sharp pain it hurts with walking at times.  She has previously had a night splint but not able to wear this as it has been uncomfortable.  She also states that she had a steroid injection which did not help much.  No injury that she reports.  She has numbness or tingling.  Some swelling to the area.  She also has bunions.  No other concerns. ? ? ?Review of Systems  ?All other systems reviewed and are negative. ? ?Past Medical History:  ?Diagnosis Date  ? Anxiety   ? Arthritis   ? Bursitis   ? GERD (gastroesophageal reflux disease)   ? Hernia, hiatal   ? Hx of trichomoniasis 2017  ? Migraine   ? ? ?Past Surgical History:  ?Procedure Laterality Date  ? BACK SURGERY    ? TUBAL LIGATION    ? ? ? ?Current Outpatient Medications:  ?  albuterol (VENTOLIN HFA) 108 (90 Base) MCG/ACT inhaler, Inhale 1-2 puffs into the lungs every 6 (six) hours as needed for wheezing or shortness of breath., Disp: 18 g, Rfl: 0 ?  HYDROcodone-acetaminophen (NORCO/VICODIN) 5-325 MG tablet, Take 1 tablet by mouth every 4 (four) hours as needed for moderate pain., Disp: 15 tablet, Rfl: 0 ?  levocetirizine (XYZAL) 5 MG tablet, Take 1 tablet (5 mg total) by mouth every evening., Disp: 90 tablet, Rfl: 0 ?  methocarbamol (ROBAXIN) 500 MG tablet, Take 1 tablet (500 mg total) by mouth every 8 (eight) hours as needed for muscle spasms., Disp: 20 tablet, Rfl: 0 ?  predniSONE (DELTASONE) 50 MG tablet, Take 1 tablet (50 mg total) by mouth daily with breakfast., Disp: 5 tablet, Rfl: 0 ?  promethazine-dextromethorphan (PROMETHAZINE-DM) 6.25-15 MG/5ML syrup, Take 5 mLs by mouth 4 (four) times daily as needed for cough., Disp: 200 mL, Rfl: 0 ? ?Allergies  ?Allergen Reactions  ? Aspirin Other (See Comments)   ?  unknown  ? ? ? ?   ?Objective:  ?Physical Exam  ?General: AAO x3, NAD ? ?Dermatological: Skin is warm, dry and supple bilateral.  There are no open sores, no preulcerative lesions, no rash or signs of infection present. ? ?Vascular: Dorsalis Pedis artery and Posterior Tibial artery pedal pulses are 2/4 bilateral with immedate capillary fill time.  There is no pain with calf compression, swelling, warmth, erythema.  ? ?Neruologic: Grossly intact via light touch bilateral.  Negative Tinel sign. ? ?Musculoskeletal: Tenderness palpation on posterior aspect the calcaneus along the area of prominent bone spur.  There is no significant discomfort along the Achilles tendon except along the insertion into the calcaneus.  There is no deficit noted in the Achilles tendon appears to be intact.  There is no pain with lateral compression of calcaneus.  Mild edema but there is no erythema or warmth.  There is also decreased range of motion of the first MPJ with bunion deformity present.  Muscular strength 5/5 in all groups tested bilateral. ? ?Gait: Unassisted, Nonantalgic.  ? ? ?   ?Assessment:  ? ?56 year old female with posterior heel spurring, insertional Achilles tendinitis; bunion, hallux rigidus ? ?   ?Plan:  ?-Treatment options discussed including all alternatives, risks, and complications ?-Etiology of symptoms were discussed ?-  X-rays were obtained and reviewed with the patient.  Significant posterior calcaneal spurring is present.  Evidence of acute fracture.  Arthritic changes present the first MPJ. ?-We discussed both conservative as well as surgical treatment options for the heel pain.  Conservatively we discussed continuing with shoe modifications I dispensed a heel lift.  Offloading pads for the bone spurs also with a gel heel pad dispensed.  Discussed stretching, icing daily.  Consider physical therapy as well.  Voltaren gel for the heel spurs.  If symptoms continue or worsen consider surgical intervention or  hold off on this today.  In regards to the hallux first MPJ arthritis discussed shoes with stiffer sole, arch support.  She can also use Voltaren gel in these areas as needed. ? ?Return in about 6 weeks (around 08/25/2021). ? ?Vivi Barrack DPM ? ?   ? ?

## 2021-08-06 ENCOUNTER — Other Ambulatory Visit: Payer: Self-pay | Admitting: Podiatry

## 2021-08-06 DIAGNOSIS — M2021 Hallux rigidus, right foot: Secondary | ICD-10-CM

## 2021-08-25 ENCOUNTER — Ambulatory Visit: Payer: 59 | Admitting: Podiatry

## 2021-08-25 DIAGNOSIS — M7730 Calcaneal spur, unspecified foot: Secondary | ICD-10-CM

## 2021-08-25 DIAGNOSIS — M2022 Hallux rigidus, left foot: Secondary | ICD-10-CM | POA: Diagnosis not present

## 2021-08-25 DIAGNOSIS — M79672 Pain in left foot: Secondary | ICD-10-CM

## 2021-08-25 DIAGNOSIS — M79671 Pain in right foot: Secondary | ICD-10-CM

## 2021-08-25 DIAGNOSIS — M2021 Hallux rigidus, right foot: Secondary | ICD-10-CM | POA: Diagnosis not present

## 2021-08-28 DIAGNOSIS — M7730 Calcaneal spur, unspecified foot: Secondary | ICD-10-CM | POA: Insufficient documentation

## 2021-08-28 DIAGNOSIS — M2021 Hallux rigidus, right foot: Secondary | ICD-10-CM | POA: Insufficient documentation

## 2021-08-28 NOTE — Progress Notes (Signed)
Subjective: ?56 year old female presents the office today for for evaluation of heel pain as well as pain to her big toe joints.  She states she been using Voltaren gel she is returning with a heel lift however she she still continues to have discomfort.  She is also been stretching, icing.  There is been no significant change compared to last appointment. ? ?Objective: ?AAO x3, NAD ?DP/PT pulses palpable bilaterally, CRT less than 3 seconds ?Prominence of the posterior calcaneus along with bone spur.  Tenderness palpation this area bilaterally.  Soft with tenderness in the first MPJs with bone spurring present.  Hallux rigidus noted.  There is no other areas of discomfort.  Trace edema noted to the area there is no erythema or warmth.  Achilles tendon appears to be intact.  No pain with calf compression, swelling, warmth, erythema ? ?Assessment: ?Hallux rigidus, posterior calcaneal spurring; Achilles tendon calcifications ? ?Plan: ?-All treatment options discussed with the patient including all alternatives, risks, complications.  ?-I again reviewed the x-rays with her on both the left and right foot.  We discussed the extensive arthritis in the first MPJ as well as the spurring on the posterior heel.  Ultimately discussed surgery versus conservative care.  For her I do think surgery is a good option however she is not able to proceed with that at this time.  In the meantime we will try a graphite insert with a heel lift for her shoes.  Continue with anti-inflammatories topically as well as traction, icing a regular basis.  Consider referral to physical therapy as well. ?-Patient encouraged to call the office with any questions, concerns, change in symptoms.  ? ?Trula Slade DPM ? ?

## 2021-10-13 ENCOUNTER — Ambulatory Visit: Payer: 59 | Admitting: Emergency Medicine

## 2021-11-17 ENCOUNTER — Telehealth: Payer: 59 | Admitting: Family Medicine

## 2021-11-17 DIAGNOSIS — B9689 Other specified bacterial agents as the cause of diseases classified elsewhere: Secondary | ICD-10-CM | POA: Diagnosis not present

## 2021-11-17 DIAGNOSIS — J019 Acute sinusitis, unspecified: Secondary | ICD-10-CM

## 2021-11-17 MED ORDER — AMOXICILLIN-POT CLAVULANATE 875-125 MG PO TABS: 1.0000 | ORAL_TABLET | Freq: Two times a day (BID) | ORAL | 0 refills | Status: AC

## 2021-11-17 NOTE — Progress Notes (Signed)

## 2021-11-24 ENCOUNTER — Encounter: Payer: Self-pay | Admitting: Nurse Practitioner

## 2021-12-05 ENCOUNTER — Ambulatory Visit: Payer: Commercial Managed Care - PPO | Admitting: Orthopedic Surgery

## 2021-12-05 ENCOUNTER — Ambulatory Visit: Payer: Commercial Managed Care - PPO | Admitting: Physician Assistant

## 2021-12-12 ENCOUNTER — Ambulatory Visit: Payer: 59 | Admitting: Orthopedic Surgery

## 2021-12-12 DIAGNOSIS — M9262 Juvenile osteochondrosis of tarsus, left ankle: Secondary | ICD-10-CM | POA: Diagnosis not present

## 2021-12-13 ENCOUNTER — Encounter: Payer: Self-pay | Admitting: Orthopedic Surgery

## 2021-12-13 NOTE — Progress Notes (Signed)
Office Visit Note   Patient: Teresa Faulkner           Date of Birth: 26-Mar-1966           MRN: [id number] Visit Date: 12/12/2021              Requested by: No referring provider defined for this encounter. PCP: Pcp, No  Chief Complaint  Patient presents with   Left Ankle - Pain      HPI: Patient is a 55 year old woman who presents in follow-up for chronic Haglund deformity bilaterally left worse than right.  Patient states that despite conservative therapy she has pain with ambulation pain at night.  Patient states that she is not on prednisone.  Assessment & Plan: Visit Diagnoses:  1. Haglund's deformity, left     Plan: Due to failure of conservative treatment patient would like to proceed with surgical intervention.  Discussed that we would proceed with a posterior medial incision takedown of the Achilles resection of the Haglund's deformity and reattachment of the Achilles tendon.  Risk and benefits were discussed including infection neurovascular injury and difficulty healing the incision.  Patient states she understands wished to proceed at this time.  Follow-Up Instructions: Return in about 2 weeks (around 12/26/2021).   Ortho Exam  Patient is alert, oriented, no adenopathy, well-dressed, normal affect, normal respiratory effort. Examination patient has a good dorsalis pedis pulse.  She has dorsiflexion to neutral she has a large calcified Haglund's deformity clinically as well as radiographically.  There are no skin ulcers or breakdown.  Imaging: No results found. No images are attached to the encounter.  Labs: Lab Results  Component Value Date   HGBA1C 5.7 (H) 06/25/2019   HGBA1C 5.6 05/28/2019   LABORGA  05/22/2016    Single organism less than 10,000 CFU/mL isolated. These organisms,commonly found on external and internal genitalia,are considered colonizers. No further testing performed.      Lab Results  Component Value Date   ALBUMIN 4.3 06/25/2019    ALBUMIN 4.2 05/28/2019   ALBUMIN 3.6 03/08/2019    No results found for: "MG" No results found for: "VD25OH"  No results found for: "PREALBUMIN"    Latest Ref Rng & Units 05/13/2020    7:49 PM 09/16/2019   12:41 PM 03/08/2019    4:02 AM  CBC EXTENDED  WBC 4.0 - 10.5 K/uL 6.7  9.4  12.7   RBC 3.87 - 5.11 MIL/uL 4.39  4.96  4.80   Hemoglobin 12.0 - 15.0 g/dL 16.0  73.7  10.6   HCT 36.0 - 46.0 % 39.5  43.6  44.3   Platelets 150 - 400 K/uL 224  315  272   NEUT# 1.7 - 7.7 K/uL 3.6  4.2    Lymph# 0.7 - 4.0 K/uL 2.3  4.4       There is no height or weight on file to calculate BMI.  Orders:  No orders of the defined types were placed in this encounter.  No orders of the defined types were placed in this encounter.    Procedures: No procedures performed  Clinical Data: No additional findings.  ROS:  All other systems negative, except as noted in the HPI. Review of Systems  Objective: Vital Signs: There were no vitals taken for this visit.  Specialty Comments:  No specialty comments available.  PMFS History: Patient Active Problem List   Diagnosis Date Noted   Hallux rigidus of both feet 08/28/2021   Calcaneal spur 08/28/2021  Urinary frequency 06/25/2019   Change in bowel movement 05/28/2019   Gastroesophageal reflux disease without esophagitis 05/28/2019   Encounter for screening for metabolic disorder 05/28/2019   Low back pain with left-sided sciatica 10/11/2016   Abdominal pain 05/22/2016   Hiatal hernia 05/22/2016   Chronic headaches 05/22/2016   Menopause 05/22/2016   Past Medical History:  Diagnosis Date   Anxiety    Arthritis    Bursitis    GERD (gastroesophageal reflux disease)    Hernia, hiatal    Hx of trichomoniasis 2017   Migraine     Family History  Problem Relation Age of Onset   Diabetes Mother    Diabetes Maternal Aunt    Hypertension Maternal Aunt    Breast cancer Other 35       Stage IV    Past Surgical History:  Procedure  Laterality Date   BACK SURGERY     TUBAL LIGATION     Social History   Occupational History   Not on file  Tobacco Use   Smoking status: Every Day    Packs/day: 0.25    Years: 20.00    Total pack years: 5.00    Types: Cigarettes   Smokeless tobacco: Never   Tobacco comments:    now back on Wellbutrin  Substance and Sexual Activity   Alcohol use: No   Drug use: No   Sexual activity: Yes    Birth control/protection: Surgical

## 2021-12-30 ENCOUNTER — Ambulatory Visit: Payer: 59 | Admitting: Family Medicine

## 2021-12-30 ENCOUNTER — Ambulatory Visit: Payer: Self-pay | Admitting: Family Medicine

## 2022-01-03 ENCOUNTER — Other Ambulatory Visit (HOSPITAL_COMMUNITY)
Admission: RE | Admit: 2022-01-03 | Discharge: 2022-01-03 | Disposition: A | Discharge status: Home / Self Care | Payer: 59 | Source: Ambulatory Visit | Attending: Nurse Practitioner | Admitting: Nurse Practitioner

## 2022-01-03 ENCOUNTER — Encounter: Payer: Self-pay | Admitting: Orthopedic Surgery

## 2022-01-03 ENCOUNTER — Ambulatory Visit (INDEPENDENT_AMBULATORY_CARE_PROVIDER_SITE_OTHER): Payer: 59 | Admitting: Nurse Practitioner

## 2022-01-03 ENCOUNTER — Encounter: Payer: Self-pay | Admitting: Nurse Practitioner

## 2022-01-03 VITALS — BP 110/66 | HR 89 | Temp 97.8°F | Ht 69.0 in | Wt 211.6 lb

## 2022-01-03 DIAGNOSIS — Z114 Encounter for screening for human immunodeficiency virus [HIV]: Secondary | ICD-10-CM

## 2022-01-03 DIAGNOSIS — Z124 Encounter for screening for malignant neoplasm of cervix: Secondary | ICD-10-CM

## 2022-01-03 DIAGNOSIS — Z1231 Encounter for screening mammogram for malignant neoplasm of breast: Secondary | ICD-10-CM

## 2022-01-03 DIAGNOSIS — Z113 Encounter for screening for infections with a predominantly sexual mode of transmission: Secondary | ICD-10-CM

## 2022-01-03 DIAGNOSIS — Z2821 Immunization not carried out because of patient refusal: Secondary | ICD-10-CM

## 2022-01-03 DIAGNOSIS — Z8619 Personal history of other infectious and parasitic diseases: Secondary | ICD-10-CM

## 2022-01-03 DIAGNOSIS — Z0000 Encounter for general adult medical examination without abnormal findings: Secondary | ICD-10-CM | POA: Diagnosis not present

## 2022-01-03 DIAGNOSIS — E669 Obesity, unspecified: Secondary | ICD-10-CM | POA: Diagnosis not present

## 2022-01-03 DIAGNOSIS — Z1159 Encounter for screening for other viral diseases: Secondary | ICD-10-CM

## 2022-01-03 NOTE — Patient Instructions (Signed)

## 2022-01-03 NOTE — Progress Notes (Signed)
Teresa Faulkner,acting as a Education administrator for Teresa Brine, FNP.,have documented all relevant documentation on the behalf of Teresa Brine, FNP,as directed by  Teresa Brine, FNP while in the presence of Teresa Faulkner, Teresa Faulkner.   Subjective:     Patient ID: Teresa Faulkner , female    DOB: Sep 29, 1965 , 56 y.o.   MRN: [id number]   Chief Complaint  Patient presents with   Annual Exam    HPI  Patient presents today for her physical, patient has no other concerns today.  BP Readings from Last 3 Encounters: 01/03/22 : 110/66 07/09/21 : (!) 153/73 01/16/21 : Marland Kitchen 137/58        Past Medical History:  Diagnosis Date   Anxiety    Arthritis    Bursitis    GERD (gastroesophageal reflux disease)    Hernia, hiatal    Hx of trichomoniasis 2017   Migraine      Family History  Problem Relation Age of Onset   Diabetes Mother    Diabetes Maternal Aunt    Hypertension Maternal Aunt    Breast cancer Other 35       Stage IV     Current Outpatient Medications:    albuterol (VENTOLIN HFA) 108 (90 Base) MCG/ACT inhaler, Inhale 1-2 puffs into the lungs every 6 (six) hours as needed for wheezing or shortness of breath., Disp: 18 g, Rfl: 0   levocetirizine (XYZAL) 5 MG tablet, Take 1 tablet (5 mg total) by mouth every evening., Disp: 90 tablet, Rfl: 0   Allergies  Allergen Reactions   Aspirin Other (See Comments)    unknown   Aspirin-Acetaminophen Itching      The patient states she is post menopausal status.  Negative for: breast discharge, breast lump(s), breast pain and breast self exam. Associated symptoms include abnormal vaginal bleeding. Pertinent negatives include abnormal bleeding (hematology), anxiety, decreased libido, depression, difficulty falling sleep, dyspareunia, history of infertility, nocturia, sexual dysfunction, sleep disturbances, urinary incontinence, urinary urgency, vaginal discharge and vaginal itching. Diet regular; she is not eating sugar or salt. And cut back on her sodas  drinking mostly water. The patient states her exercise level is    . The patient's tobacco use is:  Social History   Tobacco Use  Smoking Status Every Day   Packs/day: 0.25   Years: 20.00   Total pack years: 5.00   Types: Cigarettes  Smokeless Tobacco Never  Tobacco Comments   now back on Wellbutrin   She has been exposed to passive smoke. The patient's alcohol use is:  Social History   Substance and Sexual Activity  Alcohol Use No  . Additional information: Last pap 2018, next one scheduled for today .    Review of Systems  Constitutional: Negative.   HENT: Negative.    Eyes: Negative.   Respiratory: Negative.    Cardiovascular: Negative.   Gastrointestinal: Negative.   Endocrine: Negative.   Genitourinary: Negative.   Musculoskeletal: Negative.   Skin: Negative.   Allergic/Immunologic: Negative.   Neurological: Negative.   Hematological: Negative.   Psychiatric/Behavioral: Negative.       Today's Vitals   01/03/22 1205  BP: 110/66  Pulse: 89  Temp: 97.8 F (36.6 C)  TempSrc: Oral  Weight: 211 lb 9.6 oz (96 kg)  Height: 5' 9"  (1.753 m)  PainSc: 0-No pain   Body mass index is 31.25 kg/m.  Wt Readings from Last 3 Encounters:  01/03/22 211 lb 9.6 oz (96 kg)  07/09/21 216 lb 0.8 oz (98  kg)  01/16/21 216 lb (98 kg)     Objective:  Physical Exam Vitals reviewed. Exam conducted with a chaperone present.  Constitutional:      General: She is not in acute distress.    Appearance: Normal appearance. She is well-developed. She is obese.  HENT:     Head: Normocephalic and atraumatic.     Right Ear: Hearing, tympanic membrane, ear canal and external ear normal. There is no impacted cerumen.     Left Ear: Hearing, tympanic membrane, ear canal and external ear normal. There is no impacted cerumen.     Nose:     Comments: Deferred - masked    Mouth/Throat:     Comments: Deferred - masked Eyes:     General: Lids are normal.     Extraocular Movements:  Extraocular movements intact.     Conjunctiva/sclera: Conjunctivae normal.     Pupils: Pupils are equal, round, and reactive to light.     Funduscopic exam:    Right eye: No papilledema.        Left eye: No papilledema.  Neck:     Thyroid: No thyroid mass.     Vascular: No carotid bruit.  Cardiovascular:     Rate and Rhythm: Normal rate and regular rhythm.     Pulses: Normal pulses.     Heart sounds: Normal heart sounds. No murmur heard. Pulmonary:     Effort: Pulmonary effort is normal. No respiratory distress.     Breath sounds: Normal breath sounds. No wheezing.  Chest:     Chest wall: No mass.  Breasts:    Tanner Score is 5.     Right: Normal. No mass or tenderness.     Left: Normal. No mass or tenderness.  Abdominal:     General: Abdomen is flat. Bowel sounds are normal. There is no distension.     Palpations: Abdomen is soft.     Tenderness: There is no abdominal tenderness.     Hernia: There is no hernia in the left inguinal area or right inguinal area.  Genitourinary:    General: Normal vulva.     Exam position: Lithotomy position.     Tanner stage (genital): 5.     Labia:        Right: No tenderness.        Left: No tenderness.      Rectum: Guaiac result negative.  Musculoskeletal:        General: No swelling. Normal range of motion.     Cervical back: Full passive range of motion without pain, normal range of motion and neck supple.     Right lower leg: No edema.     Left lower leg: No edema.  Lymphadenopathy:     Upper Body:     Right upper body: No supraclavicular, axillary or pectoral adenopathy.     Left upper body: No supraclavicular, axillary or pectoral adenopathy.  Skin:    General: Skin is warm and dry.     Capillary Refill: Capillary refill takes less than 2 seconds.  Neurological:     General: No focal deficit present.     Mental Status: She is alert and oriented to person, place, and time.     Cranial Nerves: No cranial nerve deficit.      Sensory: No sensory deficit.  Psychiatric:        Mood and Affect: Mood normal.        Behavior: Behavior normal.  Thought Content: Thought content normal.        Judgment: Judgment normal.         Assessment And Plan:     1. Annual physical exam Behavior modifications discussed and diet history reviewed.   Pt will continue to exercise regularly and modify diet with low GI, plant based foods and decrease intake of processed foods.  Recommend intake of daily multivitamin, Vitamin D, and calcium.  Recommend mammogram (ordered) and colonoscopy (up to date) for preventive screenings, as well as recommend immunizations that include influenza, TDAP - Hemoglobin A1c - CMP14+EGFR - CBC - Lipid panel  2. Screening for STD (sexually transmitted disease) - Hepatitis B Surface Antibody - RPR - Cervicovaginal ancillary only  3. Encounter for Papanicolaou smear of cervix No abnormal findings on physical exam.  - Cytology -Pap Smear  4. Encounter for HIV (human immunodeficiency virus) test  5. Encounter for hepatitis C screening test for low risk patient Will check Hepatitis C screening due to recent recommendations to screen all adults 18 years and older  6. History of HPV infection  7. Encounter for screening mammogram for breast cancer Pt instructed on Self Breast Exam.According to ACOG guidelines Women aged 67 and older are recommended to get an annual mammogram. Order completed and given to patient contact the [facility] for appointment scheduling.  Pt encouraged to get annual mammogram - MM Digital Screening; Future  8. Herpes zoster vaccination declined  9. Obesity (BMI 30-39.9) Chronic Discussed healthy diet and regular exercise options  Encouraged to exercise at least 150 minutes per week with 2 days of strength training    Patient was given opportunity to ask questions. Patient verbalized understanding of the plan and was able to repeat key elements of the  plan. All questions were answered to their satisfaction.   Teresa Brine, FNP   I, Teresa Brine, FNP, have reviewed all documentation for this visit. The documentation on 01/03/22 for the exam, diagnosis, procedures, and orders are all accurate and complete.   THE PATIENT IS ENCOURAGED TO PRACTICE SOCIAL DISTANCING DUE TO THE COVID-19 PANDEMIC.

## 2022-01-04 LAB — CMP14+EGFR
ALT: 11 IU/L (ref 0–32)
AST: 20 IU/L (ref 0–40)
Albumin/Globulin Ratio: 1.8 (ref 1.2–2.2)
Albumin: 4.1 g/dL (ref 3.8–4.9)
Alkaline Phosphatase: 73 IU/L (ref 44–121)
BUN/Creatinine Ratio: 13 (ref 9–23)
BUN: 10 mg/dL (ref 6–24)
Bilirubin Total: <0.2 mg/dL (ref 0.0–1.2)
CO2: 24 mmol/L (ref 20–29)
Calcium: 9.4 mg/dL (ref 8.7–10.2)
Chloride: 105 mmol/L (ref 96–106)
Creatinine, Ser: 0.8 mg/dL (ref 0.57–1.00)
Globulin, Total: 2.3 g/dL (ref 1.5–4.5)
Glucose: 80 mg/dL (ref 70–99)
Potassium: 4.4 mmol/L (ref 3.5–5.2)
Sodium: 140 mmol/L (ref 134–144)
Total Protein: 6.4 g/dL (ref 6.0–8.5)
eGFR: 88 mL/min/{1.73_m2} (ref >59)

## 2022-01-04 LAB — LIPID PANEL
Chol/HDL Ratio: 4 ratio (ref 0.0–4.4)
Cholesterol, Total: 160 mg/dL (ref 100–199)
HDL: 40 mg/dL (ref >39)
LDL Chol Calc (NIH): 89 mg/dL (ref 0–99)
Triglycerides: 180 mg/dL — ABNORMAL HIGH (ref 0–149)
VLDL Cholesterol Cal: 31 mg/dL (ref 5–40)

## 2022-01-04 LAB — CBC
Hematocrit: 42.2 % (ref 34.0–46.6)
Hemoglobin: 13.4 g/dL (ref 11.1–15.9)
MCH: 28.1 pg (ref 26.6–33.0)
MCHC: 31.8 g/dL (ref 31.5–35.7)
MCV: 89 fL (ref 79–97)
Platelets: 288 10*3/uL (ref 150–450)
RBC: 4.77 x10E6/uL (ref 3.77–5.28)
RDW: 13.1 % (ref 11.7–15.4)
WBC: 10.5 10*3/uL (ref 3.4–10.8)

## 2022-01-04 LAB — HEPATITIS B SURFACE ANTIBODY,QUALITATIVE: Hep B Surface Ab, Qual: REACTIVE

## 2022-01-04 LAB — HEMOGLOBIN A1C
Est. average glucose Bld gHb Est-mCnc: 120 mg/dL
Hgb A1c MFr Bld: 5.8 % — ABNORMAL HIGH (ref 4.8–5.6)

## 2022-01-04 LAB — RPR: RPR Ser Ql: NONREACTIVE

## 2022-01-10 LAB — CYTOLOGY - PAP: Diagnosis: NEGATIVE

## 2022-01-11 ENCOUNTER — Other Ambulatory Visit (HOSPITAL_COMMUNITY)
Admission: RE | Admit: 2022-01-11 | Discharge: 2022-01-11 | Disposition: A | Discharge status: Home / Self Care | Payer: 59 | Source: Ambulatory Visit | Attending: Nurse Practitioner | Admitting: Nurse Practitioner

## 2022-01-11 ENCOUNTER — Ambulatory Visit: Payer: 59 | Admitting: Family Medicine

## 2022-01-11 DIAGNOSIS — Z113 Encounter for screening for infections with a predominantly sexual mode of transmission: Secondary | ICD-10-CM | POA: Insufficient documentation

## 2022-01-13 ENCOUNTER — Encounter: Payer: Self-pay | Admitting: Nurse Practitioner

## 2022-01-13 LAB — CERVICOVAGINAL ANCILLARY ONLY
Bacterial Vaginitis (gardnerella): NEGATIVE
Comment: NEGATIVE
Comment: NEGATIVE
Comment: NEGATIVE
Comment: NEGATIVE
Comment: NEGATIVE
Comment: NORMAL

## 2022-01-23 ENCOUNTER — Telehealth: Payer: 59 | Admitting: Physician Assistant

## 2022-01-23 DIAGNOSIS — J208 Acute bronchitis due to other specified organisms: Secondary | ICD-10-CM | POA: Diagnosis not present

## 2022-01-23 DIAGNOSIS — B9689 Other specified bacterial agents as the cause of diseases classified elsewhere: Secondary | ICD-10-CM

## 2022-01-23 MED ORDER — DOXYCYCLINE HYCLATE 100 MG PO TABS: 100.0000 mg | ORAL_TABLET | Freq: Two times a day (BID) | ORAL | 0 refills | Status: DC

## 2022-01-23 MED ORDER — BENZONATATE 100 MG PO CAPS: 100.0000 mg | ORAL_CAPSULE | Freq: Three times a day (TID) | ORAL | 0 refills | Status: DC | PRN

## 2022-01-23 NOTE — Progress Notes (Signed)

## 2022-01-23 NOTE — Progress Notes (Signed)
I have spent 5 minutes in review of e-visit questionnaire, review and updating patient chart, medical decision making and response to patient.   Yafet Cline Cody Lakelynn Severtson, PA-C    

## 2022-02-15 ENCOUNTER — Inpatient Hospital Stay: Admission: RE | Admit: 2022-02-15 | Payer: 59 | Source: Ambulatory Visit

## 2022-02-16 ENCOUNTER — Encounter: Payer: Self-pay | Admitting: Nurse Practitioner

## 2022-02-23 ENCOUNTER — Encounter: Payer: Self-pay | Admitting: Nurse Practitioner

## 2022-04-03 ENCOUNTER — Other Ambulatory Visit (HOSPITAL_COMMUNITY): Payer: Self-pay

## 2022-04-03 ENCOUNTER — Telehealth: Payer: 59 | Admitting: Physician Assistant

## 2022-04-03 DIAGNOSIS — J069 Acute upper respiratory infection, unspecified: Secondary | ICD-10-CM

## 2022-04-03 MED ORDER — BENZONATATE 100 MG PO CAPS
100.0000 mg | ORAL_CAPSULE | Freq: Three times a day (TID) | ORAL | 0 refills | Status: AC
  Filled 2022-04-03: qty 15, 5d supply, fill #0

## 2022-04-03 MED ORDER — GUAIFENESIN ER 600 MG PO TB12
600.0000 mg | ORAL_TABLET | Freq: Two times a day (BID) | ORAL | 0 refills | Status: DC
  Filled 2022-04-03: qty 6, 3d supply, fill #0

## 2022-04-03 NOTE — Progress Notes (Signed)
We are sorry that you are not feeling well.  Here is how we plan to help!  Based on your presentation I believe you most likely have A cough due to a virus.  This is called viral bronchitis and is best treated by rest, plenty of fluids and control of the cough.  You may use Ibuprofen or Tylenol as directed to help your symptoms.     In addition you may use A non-prescription cough medication called Mucinex DM: take 2 tablets every 12 hours. and A prescription cough medication called Tessalon Perles 100mg . You may take 1-2 capsules every 8 hours as needed for your cough.  From your responses in the eVisit questionnaire you describe inflammation in the upper respiratory tract which is causing a significant cough.  This is commonly called Bronchitis and has four common causes:   Allergies Viral Infections Acid Reflux Bacterial Infection Allergies, viruses and acid reflux are treated by controlling symptoms or eliminating the cause. An example might be a cough caused by taking certain blood pressure medications. You stop the cough by changing the medication. Another example might be a cough caused by acid reflux. Controlling the reflux helps control the cough.  USE OF BRONCHODILATOR ("RESCUE") INHALERS: There is a risk from using your bronchodilator too frequently.  The risk is that over-reliance on a medication which only relaxes the muscles surrounding the breathing tubes can reduce the effectiveness of medications prescribed to reduce swelling and congestion of the tubes themselves.  Although you feel brief relief from the bronchodilator inhaler, your asthma may actually be worsening with the tubes becoming more swollen and filled with mucus.  This can delay other crucial treatments, such as oral steroid medications. If you need to use a bronchodilator inhaler daily, several times per day, you should discuss this with your provider.  There are probably better treatments that could be used to keep your  asthma under control.     HOME CARE Only take medications as instructed by your medical team. Complete the entire course of an antibiotic. Drink plenty of fluids and get plenty of rest. Avoid close contacts especially the very young and the elderly Cover your mouth if you cough or cough into your sleeve. Always remember to wash your hands A steam or ultrasonic humidifier can help congestion.   GET HELP RIGHT AWAY IF: You develop worsening fever. You become short of breath You cough up blood. Your symptoms persist after you have completed your treatment plan MAKE SURE YOU  Understand these instructions. Will watch your condition. Will get help right away if you are not doing well or get worse.    Thank you for choosing an e-visit.  Your e-visit answers were reviewed by a board certified advanced clinical practitioner to complete your personal care plan. Depending upon the condition, your plan could have included both over the counter or prescription medications.  Please review your pharmacy choice. Make sure the pharmacy is open so you can pick up prescription now. If there is a problem, you may contact your provider through and have the prescription routed to another pharmacy.  Your safety is important to [employer]. If you have drug allergies check your prescription carefully.   For the next 24 hours you can use MyChart to ask questions about today's visit, request a non-urgent call back, or ask for a work or school excuse. You will get an email in the next two days asking about your experience. I hope that your e-visit  has been valuable and will speed your recovery.  Approximately 5 minutes was spent documenting and reviewing patient's chart.

## 2022-04-04 ENCOUNTER — Emergency Department (HOSPITAL_COMMUNITY): Payer: 59

## 2022-04-04 ENCOUNTER — Emergency Department (HOSPITAL_COMMUNITY)
Admission: EM | Admit: 2022-04-04 | Discharge: 2022-04-05 | Disposition: A | Discharge status: Home / Self Care | Payer: 59 | Attending: Emergency Medicine | Admitting: Emergency Medicine

## 2022-04-04 ENCOUNTER — Telehealth: Payer: 59 | Admitting: Physician Assistant

## 2022-04-04 ENCOUNTER — Other Ambulatory Visit (HOSPITAL_COMMUNITY): Payer: Self-pay

## 2022-04-04 ENCOUNTER — Other Ambulatory Visit: Payer: Self-pay

## 2022-04-04 DIAGNOSIS — J4521 Mild intermittent asthma with (acute) exacerbation: Secondary | ICD-10-CM | POA: Diagnosis not present

## 2022-04-04 DIAGNOSIS — J069 Acute upper respiratory infection, unspecified: Secondary | ICD-10-CM

## 2022-04-04 DIAGNOSIS — Z20822 Contact with and (suspected) exposure to covid-19: Secondary | ICD-10-CM | POA: Diagnosis not present

## 2022-04-04 DIAGNOSIS — R0602 Shortness of breath: Secondary | ICD-10-CM | POA: Diagnosis present

## 2022-04-04 LAB — COMPREHENSIVE METABOLIC PANEL
ALT: 16 U/L (ref 0–44)
AST: 20 U/L (ref 15–41)
Albumin: 3.4 g/dL — ABNORMAL LOW (ref 3.5–5.0)
Alkaline Phosphatase: 64 U/L (ref 38–126)
Anion gap: 11 (ref 5–15)
BUN: 7 mg/dL (ref 6–20)
CO2: 25 mmol/L (ref 22–32)
Calcium: 9.2 mg/dL (ref 8.9–10.3)
Chloride: 106 mmol/L (ref 98–111)
Creatinine, Ser: 0.88 mg/dL (ref 0.44–1.00)
GFR, Estimated: >60 mL/min (ref >60)
Glucose, Bld: 160 mg/dL — ABNORMAL HIGH (ref 70–99)
Potassium: 3.9 mmol/L (ref 3.5–5.1)
Sodium: 142 mmol/L (ref 135–145)
Total Bilirubin: 0.2 mg/dL — ABNORMAL LOW (ref 0.3–1.2)
Total Protein: 6.2 g/dL — ABNORMAL LOW (ref 6.5–8.1)

## 2022-04-04 LAB — CBC WITH DIFFERENTIAL/PLATELET
Abs Immature Granulocytes: 0.03 10*3/uL (ref 0.00–0.07)
Basophils Absolute: 0.1 10*3/uL (ref 0.0–0.1)
Basophils Relative: 1 %
Eosinophils Absolute: 0.4 10*3/uL (ref 0.0–0.5)
Eosinophils Relative: 4 %
HCT: 41.7 % (ref 36.0–46.0)
Hemoglobin: 13 g/dL (ref 12.0–15.0)
Immature Granulocytes: 0 %
Lymphocytes Relative: 30 %
Lymphs Abs: 2.9 10*3/uL (ref 0.7–4.0)
MCH: 28.4 pg (ref 26.0–34.0)
MCHC: 31.2 g/dL (ref 30.0–36.0)
MCV: 91.2 fL (ref 80.0–100.0)
Monocytes Absolute: 0.9 10*3/uL (ref 0.1–1.0)
Monocytes Relative: 9 %
Neutro Abs: 5.5 10*3/uL (ref 1.7–7.7)
Neutrophils Relative %: 56 %
Platelets: 258 10*3/uL (ref 150–400)
RBC: 4.57 MIL/uL (ref 3.87–5.11)
RDW: 14.1 % (ref 11.5–15.5)
WBC: 9.7 10*3/uL (ref 4.0–10.5)
nRBC: 0 % (ref 0.0–0.2)

## 2022-04-04 LAB — LIPASE, BLOOD: Lipase: 36 U/L (ref 11–51)

## 2022-04-04 LAB — RESP PANEL BY RT-PCR (FLU A&B, COVID) ARPGX2
Influenza A by PCR: NEGATIVE
Influenza B by PCR: NEGATIVE
SARS Coronavirus 2 by RT PCR: NEGATIVE

## 2022-04-04 MED ORDER — ONDANSETRON 4 MG PO TBDP
4.0000 mg | ORAL_TABLET | Freq: Once | ORAL | Status: AC
  Administered 2022-04-04: 4 mg via ORAL
  Filled 2022-04-04: qty 1

## 2022-04-04 MED ORDER — PROMETHAZINE-DM 6.25-15 MG/5ML PO SYRP
5.0000 mL | ORAL_SOLUTION | Freq: Four times a day (QID) | ORAL | 0 refills | Status: DC | PRN
  Filled 2022-04-04: qty 118, 6d supply, fill #0

## 2022-04-04 NOTE — Progress Notes (Signed)
I have spent 5 minutes in review of e-visit questionnaire, review and updating patient chart, medical decision making and response to patient.   Thoren Hosang Cody Cozette Braggs, PA-C    

## 2022-04-04 NOTE — Progress Notes (Signed)
We are sorry that you are not feeling well.  Here is how we plan to help!  Based on your presentation I believe you most likely have A cough due to a virus.  This is called viral bronchitis and is best treated by rest, plenty of fluids and control of the cough.  You may use Ibuprofen or Tylenol as directed to help your symptoms.     In addition you may use the Tessalon Perles sent in for you yesterday. I have also added on a prescription cough syrup as well.   From your responses in the eVisit questionnaire you describe inflammation in the upper respiratory tract which is causing a significant cough.  This is commonly called Bronchitis and has four common causes:   Allergies Viral Infections Acid Reflux Bacterial Infection Allergies, viruses and acid reflux are treated by controlling symptoms or eliminating the cause. An example might be a cough caused by taking certain blood pressure medications. You stop the cough by changing the medication. Another example might be a cough caused by acid reflux. Controlling the reflux helps control the cough.  USE OF BRONCHODILATOR ("RESCUE") INHALERS: There is a risk from using your bronchodilator too frequently.  The risk is that over-reliance on a medication which only relaxes the muscles surrounding the breathing tubes can reduce the effectiveness of medications prescribed to reduce swelling and congestion of the tubes themselves.  Although you feel brief relief from the bronchodilator inhaler, your asthma may actually be worsening with the tubes becoming more swollen and filled with mucus.  This can delay other crucial treatments, such as oral steroid medications. If you need to use a bronchodilator inhaler daily, several times per day, you should discuss this with your provider.  There are probably better treatments that could be used to keep your asthma under control.     HOME CARE Only take medications as instructed by your medical team. Complete the  entire course of an antibiotic. Drink plenty of fluids and get plenty of rest. Avoid close contacts especially the very young and the elderly Cover your mouth if you cough or cough into your sleeve. Always remember to wash your hands A steam or ultrasonic humidifier can help congestion.   GET HELP RIGHT AWAY IF: You develop worsening fever. You become short of breath You cough up blood. Your symptoms persist after you have completed your treatment plan MAKE SURE YOU  Understand these instructions. Will watch your condition. Will get help right away if you are not doing well or get worse.    Thank you for choosing an e-visit.  Your e-visit answers were reviewed by a board certified advanced clinical practitioner to complete your personal care plan. Depending upon the condition, your plan could have included both over the counter or prescription medications.  Please review your pharmacy choice. Make sure the pharmacy is open so you can pick up prescription now. If there is a problem, you may contact your provider through [employer] and have the prescription routed to another pharmacy.  Your safety is important to [place]. If you have drug allergies check your prescription carefully.   For the next 24 hours you can use MyChart to ask questions about today's visit, request a non-urgent call back, or ask for a work or school excuse. You will get an email in the next two days asking about your experience. I hope that your e-visit has been valuable and will speed your recovery.

## 2022-04-04 NOTE — ED Triage Notes (Signed)
Pt arrived to triage complaining of shortness of breath and a cough that started on Saturday Pt is also have N/V/D/   Hx of asthma has been using her inhalers at home and cough medications without relief   States her grandchildren were diagnosed with RSV

## 2022-04-04 NOTE — ED Provider Triage Note (Signed)
Emergency Medicine Provider Triage Evaluation Note  Teresa Faulkner , a 56 y.o. female  was evaluated in triage.  Pt complains of SOB.  States new cough and SOB, has been exposed to grandchild who was sick with RSV.  Has also developed nausea, vomiting, and diarrhea today. Denies chest pain.  No fever.  Home covid test was negative.  Review of Systems  Positive: Cough, N/V/D Negative: fever  Physical Exam  BP (!) 155/103 (BP Location: Right Arm)   Pulse 83   Temp 99.1 F (37.3 C) (Oral)   Resp (!) 24   Ht 5\' 9"  (1.753 m)   Wt 96 kg   SpO2 98%   BMI 31.25 kg/m  Gen:   Awake, no distress   Resp:  Normal effort  MSK:   Moves extremities without difficulty  Other:    Medical Decision Making  Medically screening exam initiated at 10:07 PM.  Appropriate orders placed.  was informed that the remainder of the evaluation will be completed by another provider, this initial triage assessment does not replace that evaluation, and the importance of remaining in the ED until their evaluation is complete.  Labs, covid/flu screen, CXR.   Alcide Clever, PA-C 04/04/22 2244

## 2022-04-05 MED ORDER — DEXAMETHASONE SODIUM PHOSPHATE 10 MG/ML IJ SOLN
10.0000 mg | Freq: Once | INTRAMUSCULAR | Status: AC
  Administered 2022-04-05: 10 mg via INTRAMUSCULAR
  Filled 2022-04-05: qty 1

## 2022-04-05 MED ORDER — IBUPROFEN 400 MG PO TABS
600.0000 mg | ORAL_TABLET | Freq: Once | ORAL | Status: AC
  Administered 2022-04-05: 600 mg via ORAL
  Filled 2022-04-05: qty 1

## 2022-04-05 MED ORDER — IPRATROPIUM-ALBUTEROL 0.5-2.5 (3) MG/3ML IN SOLN
3.0000 mL | Freq: Once | RESPIRATORY_TRACT | Status: AC
  Administered 2022-04-05: 3 mL via RESPIRATORY_TRACT
  Filled 2022-04-05: qty 3

## 2022-04-05 NOTE — Discharge Instructions (Signed)
You were seen today for upper respiratory symptoms.  This likely has triggered some mild asthma.  You were given a dose of steroids.  Continue your inhaler at home.  Take Tylenol or ibuprofen as needed for body aches and pains.  You were given a dose of steroids here in the emergency department.

## 2022-04-05 NOTE — ED Provider Notes (Signed)
[facility] EMERGENCY DEPARTMENT Provider Note   CSN: [id number] Arrival date & time: 04/04/22  2145     History  Chief Complaint  Patient presents with   Shortness of Breath    Teresa Faulkner is a 56 y.o. female.  HPI     This is a 56 year old female with a history of asthma who presents with cough.  Patient reports that her granddaughter was recently diagnosed with RSV.  Over the last 24 to 48 hours she has developed cough and congestion.  She states that the cough is keeping her up at night.  She has been using her inhaler with limited relief.  She denies any overt chest pain or shortness of breath.  She does report some headache.  She has also had several episodes of vomiting and diarrhea.  She has not had any fevers.  Home Medications Prior to Admission medications   Medication Sig Start Date End Date Taking? Authorizing Provider  albuterol (VENTOLIN HFA) 108 (90 Base) MCG/ACT inhaler Inhale 1-2 puffs into the lungs every 6 (six) hours as needed for wheezing or shortness of breath. 07/09/21   Wallis Bamberg, PA-C  benzonatate (TESSALON) 100 MG capsule Take 1 capsule (100 mg total) by mouth 3 (three) times daily as needed for cough. 01/23/22   Waldon Merl, PA-C  benzonatate (TESSALON) 100 MG capsule Take 1 capsule (100 mg total) by mouth every 8 (eight) hours for 5 days. 04/03/22 04/08/22  Couture, Cortni S, PA-C  doxycycline (VIBRA-TABS) 100 MG tablet Take 1 tablet (100 mg total) by mouth 2 (two) times daily. 01/23/22   Waldon Merl, PA-C  guaiFENesin (MUCINEX) 600 MG 12 hr tablet Take 1 tablet (600 mg total) by mouth 2 (two) times daily. 04/03/22   Couture, Cortni S, PA-C  levocetirizine (XYZAL) 5 MG tablet Take 1 tablet (5 mg total) by mouth every evening. 07/09/21   Wallis Bamberg, PA-C  promethazine-dextromethorphan (PROMETHAZINE-DM) 6.25-15 MG/5ML syrup Take 5 mLs by mouth 4 (four) times daily as needed for cough. 04/04/22   Waldon Merl, PA-C       Allergies    Aspirin and Aspirin-acetaminophen    Review of Systems   Review of Systems  Constitutional:  Negative for fever.  Respiratory:  Positive for cough. Negative for shortness of breath.   Cardiovascular:  Negative for chest pain.  Gastrointestinal:  Positive for diarrhea and vomiting.  All other systems reviewed and are negative.   Physical Exam Updated Vital Signs BP (!) 145/91 (BP Location: Right Arm)   Pulse 76   Temp 98.7 F (37.1 C)   Resp 20   Ht 1.753 m (5\' 9" )   Wt 96 kg   SpO2 97%   BMI 31.25 kg/m  Physical Exam Vitals and nursing note reviewed.  Constitutional:      Appearance: She is well-developed. She is not ill-appearing.  HENT:     Head: Normocephalic and atraumatic.  Eyes:     Pupils: Pupils are equal, round, and reactive to light.  Cardiovascular:     Rate and Rhythm: Normal rate and regular rhythm.     Heart sounds: Normal heart sounds.  Pulmonary:     Effort: Pulmonary effort is normal. No respiratory distress.     Breath sounds: Wheezing present.     Comments: Normal effort, actively coughing, end expiratory wheeze on pulmonary exam Abdominal:     General: Bowel sounds are normal.     Palpations: Abdomen is soft.  Musculoskeletal:  Cervical back: Neck supple.     Right lower leg: No edema.     Left lower leg: No edema.  Skin:    General: Skin is warm and dry.  Neurological:     Mental Status: She is alert and oriented to person, place, and time.  Psychiatric:        Mood and Affect: Mood normal.     ED Results / Procedures / Treatments   Labs (all labs ordered are listed, but only abnormal results are displayed) Labs Reviewed  COMPREHENSIVE METABOLIC PANEL - Abnormal; Notable for the following components:      Result Value   Glucose, Bld 160 (*)    Total Protein 6.2 (*)    Albumin 3.4 (*)    Total Bilirubin 0.2 (*)    All other components within normal limits  RESP PANEL BY RT-PCR (FLU A&B, COVID) ARPGX2  CBC WITH  DIFFERENTIAL/PLATELET  LIPASE, BLOOD  URINALYSIS, ROUTINE W REFLEX MICROSCOPIC    EKG None  Radiology DG Chest 2 View  Result Date: 04/04/2022 CLINICAL DATA:  Cough and shortness of breath EXAM: CHEST - 2 VIEW COMPARISON:  Radiograph 05/13/2020 FINDINGS: The heart size and mediastinal contours are within normal limits. Both lungs are clear. The visualized skeletal structures are unremarkable. IMPRESSION: No active cardiopulmonary disease. Electronically Signed   By: Minerva Fester M.D.   On: 04/04/2022 22:30    Procedures Procedures    Medications Ordered in ED Medications  dexamethasone (DECADRON) injection 10 mg (has no administration in time range)  ipratropium-albuterol (DUONEB) 0.5-2.5 (3) MG/3ML nebulizer solution 3 mL (has no administration in time range)  ibuprofen (ADVIL) tablet 600 mg (has no administration in time range)  ondansetron (ZOFRAN-ODT) disintegrating tablet 4 mg (4 mg Oral Given 04/04/22 2217)    ED Course/ Medical Decision Making/ A&P                           Medical Decision Making Risk Prescription drug management.   This patient presents to the ED for concern of cough, upper respiratory symptoms, this involves an extensive number of treatment options, and is a complaint that carries with it a high risk of complications and morbidity.  I considered the following differential and admission for this acute, potentially life threatening condition.  The differential diagnosis includes viral syndrome such as COVID, influenza, RSV, pneumonia, asthma exacerbation  MDM:    This is a 56 year old female who presents with cough and upper respiratory symptoms.  Known sick contact.  She is overall nontoxic and vital signs are reassuring.  She is in no respiratory distress but she does have active cough and wheezing on exam.  Suspect asthma exacerbation related to upper respiratory viral infection.  COVID and influenza screening are negative.  Could be RSV given  known sick contact.  Chest x-ray shows no evidence of pneumothorax or pneumonia.  Basic lab work obtained and is largely reassuring.  Patient was given a DuoNeb and Decadron.  She was given ibuprofen for her headache.  Recommended ongoing supportive measures at home.  (Labs, imaging, consults)  Labs: I Ordered, and personally interpreted labs.  The pertinent results include: CBC, BMP, COVID and influenza  Imaging Studies ordered: I ordered imaging studies including chest x-ray I independently visualized and interpreted imaging. I agree with the radiologist interpretation  Additional history obtained from chart review.  External records from outside source obtained and reviewed including prior evaluations  Cardiac Monitoring: The  patient was maintained on a cardiac monitor.  I personally viewed and interpreted the cardiac monitored which showed an underlying rhythm of: Sinus rhythm  Reevaluation: After the interventions noted above, I reevaluated the patient and found that they have :improved  Social Determinants of Health:  lives independently  Disposition: Discharge  Co morbidities that complicate the patient evaluation  Past Medical History:  Diagnosis Date   Anxiety    Arthritis    Bursitis    GERD (gastroesophageal reflux disease)    Hernia, hiatal    Hx of trichomoniasis 2017   Migraine      Medicines Meds ordered this encounter  Medications   ondansetron (ZOFRAN-ODT) disintegrating tablet 4 mg   dexamethasone (DECADRON) injection 10 mg   ipratropium-albuterol (DUONEB) 0.5-2.5 (3) MG/3ML nebulizer solution 3 mL   ibuprofen (ADVIL) tablet 600 mg    I have reviewed the patients home medicines and have made adjustments as needed  Problem List / ED Course: Problem List Items Addressed This Visit   None Visit Diagnoses     URI with cough and congestion    -  Primary   Mild intermittent asthma with exacerbation       Relevant Medications   dexamethasone  (DECADRON) injection 10 mg   ipratropium-albuterol (DUONEB) 0.5-2.5 (3) MG/3ML nebulizer solution 3 mL                   Final Clinical Impression(s) / ED Diagnoses Final diagnoses:  URI with cough and congestion  Mild intermittent asthma with exacerbation    Rx / DC Orders ED Discharge Orders     None         Shon Baton, MD 04/05/22 [phone]

## 2022-04-07 ENCOUNTER — Ambulatory Visit: Payer: 59

## 2022-05-15 ENCOUNTER — Other Ambulatory Visit: Payer: Self-pay | Admitting: Nurse Practitioner

## 2022-05-15 DIAGNOSIS — Z1231 Encounter for screening mammogram for malignant neoplasm of breast: Secondary | ICD-10-CM

## 2022-05-19 ENCOUNTER — Ambulatory Visit: Payer: Self-pay | Admitting: Nurse Practitioner

## 2022-07-03 ENCOUNTER — Ambulatory Visit
Admission: RE | Admit: 2022-07-03 | Discharge: 2022-07-03 | Disposition: A | Discharge status: Home / Self Care | Payer: Commercial Managed Care - PPO | Source: Ambulatory Visit

## 2022-07-03 DIAGNOSIS — Z1231 Encounter for screening mammogram for malignant neoplasm of breast: Secondary | ICD-10-CM

## 2022-07-13 ENCOUNTER — Encounter: Payer: Self-pay | Admitting: Radiology

## 2022-08-03 ENCOUNTER — Ambulatory Visit: Payer: Commercial Managed Care - PPO | Admitting: Nurse Practitioner

## 2022-08-04 ENCOUNTER — Ambulatory Visit (HOSPITAL_COMMUNITY)
Admission: EM | Admit: 2022-08-04 | Discharge: 2022-08-04 | Disposition: A | Discharge status: Home / Self Care | Payer: Commercial Managed Care - PPO

## 2022-08-04 ENCOUNTER — Encounter (HOSPITAL_COMMUNITY): Payer: Self-pay

## 2022-08-04 DIAGNOSIS — J069 Acute upper respiratory infection, unspecified: Secondary | ICD-10-CM

## 2022-08-04 MED ORDER — PROMETHAZINE-DM 6.25-15 MG/5ML PO SYRP: 5.0000 mL | ORAL_SOLUTION | Freq: Three times a day (TID) | ORAL | 0 refills | Status: DC | PRN

## 2022-08-04 MED ORDER — BENZONATATE 100 MG PO CAPS: 100.0000 mg | ORAL_CAPSULE | Freq: Three times a day (TID) | ORAL | 0 refills | Status: DC

## 2022-08-04 MED ORDER — OSELTAMIVIR PHOSPHATE 75 MG PO CAPS: 75.0000 mg | ORAL_CAPSULE | Freq: Two times a day (BID) | ORAL | 0 refills | Status: DC

## 2022-08-04 NOTE — ED Triage Notes (Signed)
Pt states that sx started Wed at work. Fever,cough,chills,headache,Body aches. Taking mucinex and tylenol. Neither with relief.

## 2022-08-04 NOTE — ED Provider Notes (Signed)
[facility]    CSN: [id number] Arrival date & time: 08/04/22  0805      History   Chief Complaint No chief complaint on file.   HPI Teresa Faulkner is a 57 y.o. female.   Patient presents to clinic with complaints of hot and cold chills, productive cough with yellow sputum, headache and generalized bodyaches that started Wednesday.  She was at work and left around noon due to her symptoms.  She is exposed to sick patients frequently.  She has been taking Tylenol and Mucinex without much relief.  She reports intermittent shortness of breath with coughing.  Denies sore throat, nausea, vomiting, diarrhea or chest pain.  She does smoke about 4 cigarettes a day.   The history is provided by the patient.    Past Medical History:  Diagnosis Date   Anxiety    Arthritis    Bursitis    GERD (gastroesophageal reflux disease)    Hernia, hiatal    Hx of trichomoniasis 2017   Migraine     Patient Active Problem List   Diagnosis Date Noted   Hallux rigidus of both feet 08/28/2021   Calcaneal spur 08/28/2021   Urinary frequency 06/25/2019   Change in bowel movement 05/28/2019   Gastroesophageal reflux disease without esophagitis 05/28/2019   Encounter for screening for metabolic disorder [id number]   Low back pain with left-sided sciatica 10/11/2016   Abdominal pain 05/22/2016   Hiatal hernia 05/22/2016   Chronic headaches 05/22/2016   Menopause 05/22/2016    Past Surgical History:  Procedure Laterality Date   BACK SURGERY     TUBAL LIGATION      OB History     Gravida  2   Para  2   Term  2   Preterm  0   AB  0   Living  2      SAB  0   IAB  0   Ectopic  0   Multiple  0   Live Births               Home Medications    Prior to Admission medications   Medication Sig Start Date End Date Taking? Authorizing Provider  benzonatate (TESSALON) 100 MG capsule Take 1 capsule (100 mg total) by mouth every 8 (eight) hours. 08/04/22  Yes  Louretta Shorten, [place] N, FNP  guaiFENesin (MUCINEX) 600 MG 12 hr tablet Take 1 tablet (600 mg total) by mouth 2 (two) times daily. 04/03/22  Yes Couture, Cortni S, PA-C  omeprazole (PRILOSEC) 40 MG capsule Take by mouth. 09/02/20  Yes [provider]  oseltamivir (TAMIFLU) 75 MG capsule Take 1 capsule (75 mg total) by mouth every 12 (twelve) hours. 08/04/22  Yes Louretta Shorten, [place] N, FNP  promethazine-dextromethorphan (PROMETHAZINE-DM) 6.25-15 MG/5ML syrup Take 5 mLs by mouth 3 (three) times daily as needed for cough. 08/04/22  Yes Louretta Shorten, [place] N, FNP    Family History Family History  Problem Relation Age of Onset   Diabetes Mother    Diabetes Maternal Aunt    Hypertension Maternal Aunt    Breast cancer Other 35       Stage IV    Social History Social History   Tobacco Use   Smoking status: Every Day    Packs/day: 0.25    Years: 20.00    Additional pack years: 0.00    Total pack years: 5.00    Types: Cigarettes   Smokeless tobacco: Never   Tobacco comments:  now back on Wellbutrin  Substance Use Topics   Alcohol use: No   Drug use: No     Allergies   Aspirin and Aspirin-acetaminophen   Review of Systems Review of Systems  Constitutional:  Positive for chills. Negative for fever.  HENT:  Negative for ear pain and sore throat.   Eyes:  Negative for pain and visual disturbance.  Respiratory:  Positive for cough and shortness of breath.   Cardiovascular:  Negative for chest pain and palpitations.  Gastrointestinal:  Negative for abdominal pain and vomiting.  Genitourinary:  Negative for dysuria and hematuria.  Musculoskeletal:  Positive for arthralgias. Negative for back pain.  Skin:  Negative for color change and rash.  Neurological:  Negative for seizures and syncope.  All other systems reviewed and are negative.    Physical Exam Triage Vital Signs ED Triage Vitals  Enc Vitals Group     BP 08/04/22 0823 119/79     Pulse Rate 08/04/22 0823 95      Resp 08/04/22 0823 20     Temp 08/04/22 0823 98.3 F (36.8 C)     Temp Source 08/04/22 0823 Oral     SpO2 08/04/22 0823 96 %     Weight 08/04/22 0821 230 lb (104.3 kg)     Height --      Head Circumference --      Peak Flow --      Pain Score 08/04/22 0821 10     Pain Loc --      Pain Edu? --      Excl. in [place]? --    No data found.  Updated Vital Signs BP 119/79 (BP Location: Left Arm)   Pulse 95   Temp 98.3 F (36.8 C) (Oral)   Resp 20   Wt 230 lb (104.3 kg)   SpO2 96%   BMI 33.97 kg/m   Visual Acuity Right Eye Distance:   Left Eye Distance:   Bilateral Distance:    Right Eye Near:   Left Eye Near:    Bilateral Near:     Physical Exam Vitals and nursing note reviewed.  Constitutional:      General: She is not in acute distress.    Appearance: She is well-developed.  HENT:     Head: Normocephalic and atraumatic.     Right Ear: External ear normal.     Left Ear: External ear normal.     Nose: Nose normal.     Mouth/Throat:     Mouth: Mucous membranes are moist.     Pharynx: Posterior oropharyngeal erythema present.  Eyes:     General: No scleral icterus.       Right eye: No discharge.        Left eye: No discharge.     Conjunctiva/sclera: Conjunctivae normal.     Pupils: Pupils are equal, round, and reactive to light.  Cardiovascular:     Rate and Rhythm: Normal rate and regular rhythm.     Heart sounds: Normal heart sounds. No murmur heard. Pulmonary:     Effort: Pulmonary effort is normal. No respiratory distress.     Breath sounds: Normal breath sounds.  Musculoskeletal:        General: No swelling. Normal range of motion.     Cervical back: Normal range of motion and neck supple.  Skin:    General: Skin is warm and dry.     Capillary Refill: Capillary refill takes less than 2 seconds.  Neurological:  Mental Status: She is alert and oriented to person, place, and time.  Psychiatric:        Mood and Affect: Mood normal.      UC Treatments  / Results  Labs (all labs ordered are listed, but only abnormal results are displayed) Labs Reviewed  POC INFLUENZA A AND B ANTIGEN (URGENT CARE ONLY)    EKG   Radiology No results found.  Procedures Procedures (including critical care time)  Medications Ordered in UC Medications - No data to display  Initial Impression / Assessment and Plan / UC Course  I have reviewed the triage vital signs and the nursing notes.  Pertinent labs & imaging results that were available during my care of the patient were reviewed by me and considered in my medical decision making (see chart for details).  Vitals in triage reviewed, patient is hemodynamically stable.  Lungs vesicular posteriorly, posterior pharynx with minor erythema.  Due to duration and abrupt onset, suspect viral upper respiratory infection.  Rapid influenza testing in clinic unable to be obtained, patient unable to tolerate the nasal swab.  Will treat prophylactically for influenza with Tamiflu.  Discussed symptomatic management alternating between Tylenol and ibuprofen, cough medication, hydration and rest.  Patient verbalized understanding, no questions at this time.  Return emergency precautions discussed.    Final Clinical Impressions(s) / UC Diagnoses   Final diagnoses:  Viral upper respiratory tract infection with cough     Discharge Instructions      Please start the Tamiflu as soon as possible.  You can take the Tessalon Perles throughout the day as needed for cough, you can use the promethazine syrup at night as needed for cough.  Please do not drink or drive on this medication as it may make you drowsy.  Ensure you are drinking at least 64 ounces of water, you can alternate between Tylenol and ibuprofen every 4-6 hours as needed for aches and pains.  You are good to return to work once you are 24 hours fever free.  Please return to clinic if you have any worsening of symptoms, or no improvement over the next 5  days.     ED Prescriptions     Medication Sig Dispense Auth. Provider   oseltamivir (TAMIFLU) 75 MG capsule Take 1 capsule (75 mg total) by mouth every 12 (twelve) hours. 10 capsule Louretta Shorten, [place] N, FNP   benzonatate (TESSALON) 100 MG capsule Take 1 capsule (100 mg total) by mouth every 8 (eight) hours. 21 capsule Louretta Shorten, [place] N, [place]   promethazine-dextromethorphan (PROMETHAZINE-DM) 6.25-15 MG/5ML syrup Take 5 mLs by mouth 3 (three) times daily as needed for cough. 118 mL Marketta Valadez, [place] N, [place]      PDMP not reviewed this encounter.   Halley Kincer, [place] N, [place] 08/04/22 0900

## 2022-08-04 NOTE — Discharge Instructions (Addendum)
Please start the Tamiflu as soon as possible.  You can take the Tessalon Perles throughout the day as needed for cough, you can use the promethazine syrup at night as needed for cough.  Please do not drink or drive on this medication as it may make you drowsy.  Ensure you are drinking at least 64 ounces of water, you can alternate between Tylenol and ibuprofen every 4-6 hours as needed for aches and pains.  You are good to return to work once you are 24 hours fever free.  Please return to clinic if you have any worsening of symptoms, or no improvement over the next 5 days.

## 2022-08-04 NOTE — ED Notes (Signed)
[place], NP notified that patient is refusing the flu test.

## 2022-09-05 ENCOUNTER — Encounter: Payer: Self-pay | Admitting: Nurse Practitioner

## 2022-09-05 ENCOUNTER — Ambulatory Visit: Payer: Commercial Managed Care - PPO | Admitting: Nurse Practitioner

## 2022-09-05 VITALS — BP 134/70 | HR 75 | Temp 98.1°F | Ht 69.0 in | Wt 217.2 lb

## 2022-09-05 DIAGNOSIS — R0981 Nasal congestion: Secondary | ICD-10-CM

## 2022-09-05 DIAGNOSIS — E6609 Other obesity due to excess calories: Secondary | ICD-10-CM | POA: Diagnosis not present

## 2022-09-05 DIAGNOSIS — R42 Dizziness and giddiness: Secondary | ICD-10-CM

## 2022-09-05 DIAGNOSIS — R0609 Other forms of dyspnea: Secondary | ICD-10-CM

## 2022-09-05 DIAGNOSIS — Z6832 Body mass index (BMI) 32.0-32.9, adult: Secondary | ICD-10-CM

## 2022-09-05 DIAGNOSIS — R7309 Other abnormal glucose: Secondary | ICD-10-CM | POA: Diagnosis not present

## 2022-09-05 MED ORDER — MECLIZINE HCL 12.5 MG PO TABS: 12.5000 mg | ORAL_TABLET | Freq: Three times a day (TID) | ORAL | 0 refills | Status: DC | PRN

## 2022-09-05 MED ORDER — MOMETASONE FUROATE 50 MCG/ACT NA SUSP
2.0000 | Freq: Every day | NASAL | 2 refills | Status: DC
Start: 2022-09-05 — End: 2023-03-27

## 2022-09-05 NOTE — Progress Notes (Signed)
Jeri Cos [place],acting as a Neurosurgeon for Arnette Felts, FNP.,have documented all relevant documentation on the behalf of Arnette Felts, FNP,as directed by  Arnette Felts, FNP while in the presence of Arnette Felts, FNP.    Subjective:     Patient ID: Teresa Faulkner , female    DOB: 12/12/1965 , 57 y.o.   MRN: [id number]   Chief Complaint  Patient presents with   Dizziness    HPI  Patient presents today for dizziness. She reports she has been dizzy for the past 3 weeks. She reports the dizziness is worse in the mornings when she first wakes up. When jumping up too quickly she will have dizziness. She drinks 2 bottles of water a day (flavored). Started after she was sick with the flu. She does have a history of allergies. She is not taking any medications for allergies she is having headaches. Not taking any medications. She has been having worsening in her vision now wearing her eye glasses at night.   Wt Readings from Last 3 Encounters: 09/05/22 : 217 lb 3.2 oz (98.5 kg) 08/04/22 : 230 lb (104.3 kg) 04/04/22 : 211 lb 10.3 oz (96 kg)    Dizziness This is a new problem. The current episode started 1 to 4 weeks ago. The problem occurs constantly. The problem has been unchanged. Associated symptoms include headaches. Pertinent negatives include no abdominal pain, anorexia, fatigue, nausea or weakness. Exacerbated by: changing positions. She has tried nothing for the symptoms.     Past Medical History:  Diagnosis Date   Anxiety    Arthritis    Bursitis    GERD (gastroesophageal reflux disease)    Hernia, hiatal    Hx of trichomoniasis 2017   Migraine      Family History  Problem Relation Age of Onset   Diabetes Mother    Diabetes Maternal Aunt    Hypertension Maternal Aunt    Breast cancer Other 35       Stage IV     Current Outpatient Medications:    meclizine (ANTIVERT) 12.5 MG tablet, Take 1 tablet (12.5 mg total) by mouth 3 (three) times daily as needed for  dizziness., Disp: 30 tablet, Rfl: 0   mometasone (NASONEX) 50 MCG/ACT nasal spray, Place 2 sprays into the nose daily., Disp: 1 each, Rfl: 2   omeprazole (PRILOSEC) 40 MG capsule, Take by mouth., Disp: , Rfl:    Allergies  Allergen Reactions   Aspirin Other (See Comments)    unknown   Aspirin-Acetaminophen Itching     Review of Systems  Constitutional: Negative.  Negative for fatigue.  Eyes: Negative.   Respiratory: Negative.    Cardiovascular: Negative.   Gastrointestinal:  Negative for abdominal pain, anorexia and nausea.  Musculoskeletal: Negative.   Skin: Negative.   Neurological:  Positive for dizziness and headaches. Negative for weakness.  Psychiatric/Behavioral: Negative.       Today's Vitals   09/05/22 1632 09/05/22 1638 09/05/22 1640  BP: 130/80 132/80 134/70  Pulse: 81 69 75  Temp: 98.1 F (36.7 C)    Weight: 217 lb 3.2 oz (98.5 kg)    Height: 5\' 9"  (1.753 m)    PainSc: 0-No pain     Body mass index is 32.07 kg/m.   Objective:  Physical Exam Vitals reviewed.  Constitutional:      General: She is not in acute distress.    Appearance: Normal appearance.  Cardiovascular:     Rate and Rhythm: Normal rate and regular rhythm.  Pulses: Normal pulses.     Heart sounds: Normal heart sounds. No murmur heard. Pulmonary:     Effort: Pulmonary effort is normal. No respiratory distress.     Breath sounds: Normal breath sounds. No wheezing.  Skin:    General: Skin is warm and dry.     Capillary Refill: Capillary refill takes less than 2 seconds.  Neurological:     General: No focal deficit present.     Mental Status: She is alert and oriented to person, place, and time.     Cranial Nerves: No cranial nerve deficit.     Motor: No weakness.         Assessment And Plan:     1. Vertigo Comments: This may be related to TM bulging related to allergies. will treat with meclizine if not better return call to office. - meclizine (ANTIVERT) 12.5 MG tablet; Take 1  tablet (12.5 mg total) by mouth 3 (three) times daily as needed for dizziness.  Dispense: 30 tablet; Refill: 0 - BMP8+eGFR - TSH - CBC  2. Nasal congestion - mometasone (NASONEX) 50 MCG/ACT nasal spray; Place 2 sprays into the nose daily.  Dispense: 1 each; Refill: 2  3. Abnormal glucose Comments: Hgba1c was slightly elevated at last visit, encouraged to limit intake of sugary foods and drinks. Will check HgbA1c. - BMP8+eGFR - Hemoglobin A1c  4. Dyspnea on exertion Comments: No abnormal findings on physical exam. Will check CXR. - DG Chest 2 View; Future  5. Class 1 obesity due to excess calories with body mass index (BMI) of 32.0 to 32.9 in adult, unspecified whether serious comorbidity present She is encouraged to strive for BMI less than 30 to decrease cardiac risk. Advised to aim for at least 150 minutes of exercise per week.    Patient was given opportunity to ask questions. Patient verbalized understanding of the plan and was able to repeat key elements of the plan. All questions were answered to their satisfaction.  Arnette Felts, FNP   I, Arnette Felts, FNP, have reviewed all documentation for this visit. The documentation on 09/05/22 for the exam, diagnosis, procedures, and orders are all accurate and complete.   IF YOU HAVE BEEN REFERRED TO A SPECIALIST, IT MAY TAKE 1-2 WEEKS TO SCHEDULE/PROCESS THE REFERRAL. IF YOU HAVE NOT HEARD FROM US/SPECIALIST IN TWO WEEKS, PLEASE GIVE [place] A CALL AT [phone].   THE PATIENT IS ENCOURAGED TO PRACTICE SOCIAL DISTANCING DUE TO THE COVID-19 PANDEMIC.

## 2022-09-06 ENCOUNTER — Encounter: Payer: Self-pay | Admitting: Nurse Practitioner

## 2022-09-08 ENCOUNTER — Telehealth: Payer: Self-pay

## 2022-09-08 ENCOUNTER — Other Ambulatory Visit: Payer: Self-pay

## 2022-09-08 NOTE — Telephone Encounter (Signed)
PA Sent to plan.

## 2022-09-19 DIAGNOSIS — R42 Dizziness and giddiness: Secondary | ICD-10-CM | POA: Insufficient documentation

## 2022-09-19 DIAGNOSIS — E6609 Other obesity due to excess calories: Secondary | ICD-10-CM | POA: Insufficient documentation

## 2022-09-19 DIAGNOSIS — R0981 Nasal congestion: Secondary | ICD-10-CM | POA: Insufficient documentation

## 2022-09-19 DIAGNOSIS — R7309 Other abnormal glucose: Secondary | ICD-10-CM | POA: Insufficient documentation

## 2022-09-19 DIAGNOSIS — R0609 Other forms of dyspnea: Secondary | ICD-10-CM | POA: Insufficient documentation

## 2022-09-27 NOTE — Telephone Encounter (Signed)
This drug/product is not covered under the pharmacy benefit. Prior Authorization is not available.

## 2022-09-29 ENCOUNTER — Telehealth: Payer: Self-pay

## 2022-10-05 NOTE — Telephone Encounter (Signed)
error 

## 2022-11-30 DIAGNOSIS — Z6834 Body mass index (BMI) 34.0-34.9, adult: Secondary | ICD-10-CM | POA: Diagnosis not present

## 2022-11-30 DIAGNOSIS — Z01419 Encounter for gynecological examination (general) (routine) without abnormal findings: Secondary | ICD-10-CM | POA: Diagnosis not present

## 2022-12-18 DIAGNOSIS — H524 Presbyopia: Secondary | ICD-10-CM | POA: Diagnosis not present

## 2022-12-18 DIAGNOSIS — H5213 Myopia, bilateral: Secondary | ICD-10-CM | POA: Diagnosis not present

## 2022-12-18 DIAGNOSIS — H52223 Regular astigmatism, bilateral: Secondary | ICD-10-CM | POA: Diagnosis not present

## 2022-12-23 ENCOUNTER — Emergency Department (HOSPITAL_COMMUNITY): Payer: Commercial Managed Care - PPO

## 2022-12-23 ENCOUNTER — Other Ambulatory Visit: Payer: Self-pay

## 2022-12-23 ENCOUNTER — Emergency Department (HOSPITAL_COMMUNITY)
Admission: EM | Admit: 2022-12-23 | Discharge: 2022-12-23 | Disposition: A | Discharge status: Home / Self Care | Payer: Commercial Managed Care - PPO | Attending: Emergency Medicine | Admitting: Emergency Medicine

## 2022-12-23 ENCOUNTER — Encounter (HOSPITAL_COMMUNITY): Payer: Self-pay

## 2022-12-23 DIAGNOSIS — M791 Myalgia, unspecified site: Secondary | ICD-10-CM | POA: Insufficient documentation

## 2022-12-23 DIAGNOSIS — R252 Cramp and spasm: Secondary | ICD-10-CM | POA: Diagnosis not present

## 2022-12-23 DIAGNOSIS — D72829 Elevated white blood cell count, unspecified: Secondary | ICD-10-CM | POA: Insufficient documentation

## 2022-12-23 DIAGNOSIS — R0602 Shortness of breath: Secondary | ICD-10-CM | POA: Diagnosis not present

## 2022-12-23 DIAGNOSIS — R531 Weakness: Secondary | ICD-10-CM | POA: Diagnosis not present

## 2022-12-23 DIAGNOSIS — R9082 White matter disease, unspecified: Secondary | ICD-10-CM | POA: Diagnosis not present

## 2022-12-23 LAB — COMPREHENSIVE METABOLIC PANEL
ALT: 23 U/L (ref 0–44)
AST: 38 U/L (ref 15–41)
Albumin: 3.9 g/dL (ref 3.5–5.0)
Alkaline Phosphatase: 63 U/L (ref 38–126)
Anion gap: 11 (ref 5–15)
BUN: 13 mg/dL (ref 6–20)
CO2: 25 mmol/L (ref 22–32)
Calcium: 9.6 mg/dL (ref 8.9–10.3)
Chloride: 105 mmol/L (ref 98–111)
Creatinine, Ser: 1.04 mg/dL — ABNORMAL HIGH (ref 0.44–1.00)
GFR, Estimated: >60 mL/min (ref >60)
Glucose, Bld: 150 mg/dL — ABNORMAL HIGH (ref 70–99)
Potassium: 3.2 mmol/L — ABNORMAL LOW (ref 3.5–5.1)
Sodium: 141 mmol/L (ref 135–145)
Total Bilirubin: 0.7 mg/dL (ref 0.3–1.2)
Total Protein: 6.8 g/dL (ref 6.5–8.1)

## 2022-12-23 LAB — CBC WITH DIFFERENTIAL/PLATELET
Abs Immature Granulocytes: 0.03 10*3/uL (ref 0.00–0.07)
Basophils Absolute: 0.1 10*3/uL (ref 0.0–0.1)
Basophils Relative: 1 %
Eosinophils Absolute: 0.2 10*3/uL (ref 0.0–0.5)
Eosinophils Relative: 2 %
HCT: 42 % (ref 36.0–46.0)
Hemoglobin: 13.3 g/dL (ref 12.0–15.0)
Immature Granulocytes: 0 %
Lymphocytes Relative: 37 %
Lymphs Abs: 4.6 10*3/uL — ABNORMAL HIGH (ref 0.7–4.0)
MCH: 28 pg (ref 26.0–34.0)
MCHC: 31.7 g/dL (ref 30.0–36.0)
MCV: 88.4 fL (ref 80.0–100.0)
Monocytes Absolute: 0.8 10*3/uL (ref 0.1–1.0)
Monocytes Relative: 6 %
Neutro Abs: 6.7 10*3/uL (ref 1.7–7.7)
Neutrophils Relative %: 54 %
Platelets: 276 10*3/uL (ref 150–400)
RBC: 4.75 MIL/uL (ref 3.87–5.11)
RDW: 13.8 % (ref 11.5–15.5)
WBC: 12.4 10*3/uL — ABNORMAL HIGH (ref 4.0–10.5)
nRBC: 0 % (ref 0.0–0.2)

## 2022-12-23 LAB — URINALYSIS, ROUTINE W REFLEX MICROSCOPIC
Bacteria, UA: NONE SEEN
Glucose, UA: NEGATIVE mg/dL
Hgb urine dipstick: NEGATIVE
Ketones, ur: 5 mg/dL — AB
Leukocytes,Ua: NEGATIVE
Nitrite: NEGATIVE
Protein, ur: 30 mg/dL — AB
Specific Gravity, Urine: 1.031 — ABNORMAL HIGH (ref 1.005–1.030)
pH: 5 (ref 5.0–8.0)

## 2022-12-23 LAB — I-STAT VENOUS BLOOD GAS, ED
Acid-Base Excess: 3 mmol/L — ABNORMAL HIGH (ref 0.0–2.0)
Bicarbonate: 27.2 mmol/L (ref 20.0–28.0)
Calcium, Ion: 1.23 mmol/L (ref 1.15–1.40)
HCT: 42 % (ref 36.0–46.0)
Hemoglobin: 14.3 g/dL (ref 12.0–15.0)
O2 Saturation: 55 %
Potassium: 3.1 mmol/L — ABNORMAL LOW (ref 3.5–5.1)
Sodium: 143 mmol/L (ref 135–145)
TCO2: 28 mmol/L (ref 22–32)
pCO2, Ven: 40.1 mmHg — ABNORMAL LOW (ref 44–60)
pH, Ven: 7.44 — ABNORMAL HIGH (ref 7.25–7.43)
pO2, Ven: 28 mmHg — CL (ref 32–45)

## 2022-12-23 LAB — MAGNESIUM: Magnesium: 1.9 mg/dL (ref 1.7–2.4)

## 2022-12-23 LAB — TROPONIN I (HIGH SENSITIVITY): Troponin I (High Sensitivity): 3 ng/L (ref <18)

## 2022-12-23 MED ORDER — POTASSIUM CHLORIDE CRYS ER 20 MEQ PO TBCR
40.0000 meq | EXTENDED_RELEASE_TABLET | Freq: Once | ORAL | Status: AC
  Administered 2022-12-23: 40 meq via ORAL
  Filled 2022-12-23: qty 2

## 2022-12-23 MED ORDER — METHOCARBAMOL 500 MG PO TABS
750.0000 mg | ORAL_TABLET | Freq: Once | ORAL | Status: AC
  Administered 2022-12-23: 750 mg via ORAL
  Filled 2022-12-23: qty 2

## 2022-12-23 MED ORDER — METHOCARBAMOL 500 MG PO TABS: 500.0000 mg | ORAL_TABLET | Freq: Three times a day (TID) | ORAL | 0 refills | Status: DC | PRN

## 2022-12-23 MED ORDER — LACTATED RINGERS IV BOLUS: 1000.0000 mL | Freq: Once | INTRAVENOUS | Status: DC

## 2022-12-23 MED ORDER — DIAZEPAM 2 MG PO TABS
2.0000 mg | ORAL_TABLET | Freq: Once | ORAL | Status: AC
  Administered 2022-12-23: 2 mg via ORAL
  Filled 2022-12-23: qty 1

## 2022-12-23 NOTE — ED Provider Triage Note (Signed)
Emergency Medicine Provider Triage Evaluation Note  Teresa Faulkner , a 57 y.o. female  was evaluated in triage.  Pt complains of cramps shortness of breath stiffening.  History given by daughter.  Patient reports was eating and then went outside had pain in her legs stiffened up everywhere was rigid.  Water massage her she came into the ER.  Face was distorted.  Patient does not take psychiatric medications..  Review of Systems  Positive: Asthma and stiffening Negative: Fever  Physical Exam  BP 126/64 (BP Location: Right Arm)   Pulse 96   Temp 98.3 F (36.8 C) (Oral)   Resp 16   SpO2 98%  Gen:   Awake, facial contortion eventually relaxed Resp:  hyperventilating MSK:   Moves extremities without difficulty  Other:  No pronator drift, cranial nerves II through XII intact, normal bilateral equal grip strength  Medical Decision Making  Medically screening exam initiated at 4:02 PM.  Appropriate orders placed.  Alcide Clever was informed that the remainder of the evaluation will be completed by another provider, this initial triage assessment does not replace that evaluation, and the importance of remaining in the ED until their evaluation is complete.     Arthor Captain, PA-C 12/23/22 [phone]

## 2022-12-23 NOTE — ED Provider Notes (Signed)
[place] EMERGENCY DEPARTMENT AT [facility] Provider Note   CSN: [id number] Arrival date & time: 12/23/22  1528     History  Chief Complaint  Patient presents with   Muscle Spasms   Shortness of Breath    Teresa Faulkner is a 57 y.o. female.   Shortness of Breath Patient presents for shortness of breath and muscle cramps.  She states that she was in her normal state of health earlier today.  Shortly prior to arrival, she was eating lunch.  After lunch, she had stiffening in her facial muscles, legs, and generalized weakness.  She had some mild shortness of breath.  Currently, pain has subsided.  She continues to experience generalized weakness.     Home Medications Prior to Admission medications   Medication Sig Start Date End Date Taking? Authorizing Provider  methocarbamol (ROBAXIN) 500 MG tablet Take 1 tablet (500 mg total) by mouth every 8 (eight) hours as needed for muscle spasms. 12/23/22  Yes Gloris Manchester, MD  meclizine (ANTIVERT) 12.5 MG tablet Take 1 tablet (12.5 mg total) by mouth 3 (three) times daily as needed for dizziness. 09/05/22   Arnette Felts, FNP  mometasone (NASONEX) 50 MCG/ACT nasal spray Place 2 sprays into the nose daily. 09/05/22 09/05/23  Arnette Felts, FNP  omeprazole (PRILOSEC) 40 MG capsule Take by mouth. 09/02/20   [provider]      Allergies    Aspirin and Aspirin-acetaminophen    Review of Systems   Review of Systems  Respiratory:  Positive for shortness of breath.   Musculoskeletal:  Positive for myalgias.  Neurological:  Positive for weakness (Generalized).  All other systems reviewed and are negative.   Physical Exam Updated Vital Signs BP 130/71 (BP Location: Left Arm)   Pulse 77   Temp 98 F (36.7 C) (Oral)   Resp 19   Ht 5\' 9"  (1.753 m)   Wt 98.5 kg   SpO2 99%   BMI 32.07 kg/m  Physical Exam Vitals and nursing note reviewed.  Constitutional:      General: She is not in acute distress.    Appearance:  She is well-developed. She is not ill-appearing, toxic-appearing or diaphoretic.  HENT:     Head: Normocephalic and atraumatic.     Mouth/Throat:     Mouth: Mucous membranes are moist.  Eyes:     General: No visual field deficit.    Extraocular Movements: Extraocular movements intact.     Conjunctiva/sclera: Conjunctivae normal.  Cardiovascular:     Rate and Rhythm: Normal rate and regular rhythm.     Heart sounds: No murmur heard. Pulmonary:     Effort: Pulmonary effort is normal. No tachypnea or respiratory distress.     Breath sounds: Normal breath sounds. No wheezing, rhonchi or rales.  Chest:     Chest wall: No tenderness.  Abdominal:     Palpations: Abdomen is soft.     Tenderness: There is no abdominal tenderness.  Musculoskeletal:        General: No swelling. Normal range of motion.     Cervical back: Neck supple.     Right lower leg: No edema.     Left lower leg: No edema.  Skin:    General: Skin is warm and dry.     Coloration: Skin is not cyanotic or pale.  Neurological:     General: No focal deficit present.     Mental Status: She is alert and oriented to person, place, and time.  Cranial Nerves: Cranial nerves 2-12 are intact. No dysarthria or facial asymmetry.     Sensory: Sensation is intact. No sensory deficit.     Motor: Weakness (Global) present. No pronator drift.     Coordination: Coordination is intact. Finger-Nose-Finger Test normal.  Psychiatric:        Mood and Affect: Affect is flat.        Speech: Speech is delayed.        Behavior: Behavior is slowed. Behavior is cooperative.     ED Results / Procedures / Treatments   Labs (all labs ordered are listed, but only abnormal results are displayed) Labs Reviewed  COMPREHENSIVE METABOLIC PANEL - Abnormal; Notable for the following components:      Result Value   Potassium 3.2 (*)    Glucose, Bld 150 (*)    Creatinine, Ser 1.04 (*)    All other components within normal limits  CBC WITH  DIFFERENTIAL/PLATELET - Abnormal; Notable for the following components:   WBC 12.4 (*)    Lymphs Abs 4.6 (*)    All other components within normal limits  URINALYSIS, ROUTINE W REFLEX MICROSCOPIC - Abnormal; Notable for the following components:   Color, Urine AMBER (*)    APPearance HAZY (*)    Specific Gravity, Urine 1.031 (*)    Bilirubin Urine SMALL (*)    Ketones, ur 5 (*)    Protein, ur 30 (*)    All other components within normal limits  I-STAT VENOUS BLOOD GAS, ED - Abnormal; Notable for the following components:   pH, Ven 7.440 (*)    pCO2, Ven 40.1 (*)    pO2, Ven 28 (*)    Acid-Base Excess 3.0 (*)    Potassium 3.1 (*)    All other components within normal limits  MAGNESIUM  TROPONIN I (HIGH SENSITIVITY)  TROPONIN I (HIGH SENSITIVITY)    EKG EKG Interpretation Date/Time:  Saturday December 23 2022 16:14:35 EDT Ventricular Rate:  95 PR Interval:  150 QRS Duration:  78 QT Interval:  368 QTC Calculation: 462 R Axis:   63  Text Interpretation: Sinus rhythm with occasional Premature ventricular complexes Otherwise normal ECG Confirmed by Gloris Manchester ([id number]) on 12/23/2022 5:04:01 PM  Radiology MR BRAIN WO CONTRAST  Result Date: 12/23/2022 CLINICAL DATA:  Initial evaluation for acute TIA. EXAM: MRI HEAD WITHOUT CONTRAST TECHNIQUE: Multiplanar, multiecho pulse sequences of the brain and surrounding structures were obtained without intravenous contrast. COMPARISON:  None Available. FINDINGS: Brain: Cerebral volume within normal limits. Scattered patchy T2/FLAIR hyperintensity seen involving the supratentorial cerebral white matter, nonspecific, but most commonly related to chronic microvascular ischemic disease. Changes are mild in nature. No evidence for acute or subacute ischemia. Gray-white matter differentiation maintained. No areas of chronic cortical infarction. No acute or chronic intracranial blood products. No mass lesion, mass effect or midline shift. No hydrocephalus or  extra-axial fluid collection. Pituitary gland suprasellar region within normal limits. Vascular: Major intracranial vascular flow voids are maintained. Skull and upper cervical spine: Craniocervical junction within normal limits. Bone marrow signal intensity normal. No scalp soft tissue abnormality. Sinuses/Orbits: Globes and orbital soft tissues within normal limits. Paranasal sinuses are largely clear. No significant mastoid effusion. Other: None. IMPRESSION: 1. No acute intracranial abnormality. 2. Mild scattered T2/FLAIR signal abnormality involving the supratentorial cerebral white matter, nonspecific, but most commonly related to chronic microvascular ischemic disease. Electronically Signed   By: Rise Mu M.D.   On: 12/23/2022 18:18   DG Chest 2 View  Result  Date: 12/23/2022 CLINICAL DATA:  Shortness of breath. EXAM: CHEST - 2 VIEW COMPARISON:  Chest radiograph 04/04/2022. FINDINGS: Clear lungs. Normal heart size and mediastinal contours. No pleural effusion or pneumothorax. Visualized bones and upper abdomen are unremarkable. IMPRESSION: No evidence of acute cardiopulmonary disease. Electronically Signed   By: Orvan Falconer M.D.   On: 12/23/2022 16:56    Procedures Procedures    Medications Ordered in ED Medications  lactated ringers bolus 1,000 mL (1,000 mLs Intravenous Not Given 12/23/22 1930)  diazepam (VALIUM) tablet 2 mg (2 mg Oral Given 12/23/22 1629)  potassium chloride SA (KLOR-CON M) CR tablet 40 mEq (40 mEq Oral Given 12/23/22 1836)  methocarbamol (ROBAXIN) tablet 750 mg (750 mg Oral Given 12/23/22 1835)    ED Course/ Medical Decision Making/ A&P                                 Medical Decision Making Amount and/or Complexity of Data Reviewed Radiology: ordered.  Risk Prescription drug management.   This patient presents to the ED for concern of generalized weakness, this involves an extensive number of treatment options, and is a complaint that carries with  it a high risk of complications and morbidity.  The differential diagnosis includes CVA, TIA, anxiety, seizure, metabolic derangements, dehydration, polypharmacy, withdrawal   Co morbidities that complicate the patient evaluation  GERD, anxiety, arthritis, migraine headaches   Additional history obtained:  Additional history obtained from patient's daughter External records from outside source obtained and reviewed including EMR   Lab Tests:  I Ordered, and personally interpreted labs.  The pertinent results include: Mild hypokalemia with otherwise normal electrolytes, normal hemoglobin, mild leukocytosis, creatinine very slightly increased from baseline, normal troponin, no evidence of UTI   Imaging Studies ordered:  I ordered imaging studies including chest x-ray, MRI brain I independently visualized and interpreted imaging which showed no acute findings I agree with the radiologist interpretation   Cardiac Monitoring: / EKG:  The patient was maintained on a cardiac monitor.  I personally viewed and interpreted the cardiac monitored which showed an underlying rhythm of: Sinus rhythm   Problem List / ED Course / Critical interventions / Medication management  Patient presents for an episode of muscle stiffening and generalized weakness.  On arrival in the ED, she is alert and oriented.  She is slow to verbal responses and movements.  She does not have any focal deficits.  She does seem to have a global weakness.  Some of this weakness seems to be distractible.  When testing for pronator drift, she holds her hands very low.  She is, however, then able to perform finger-nose with her hand raised up high.  Daughter reports that her symptoms have slightly improved since onset.  Initially, she was not talking.  Now she is talking and answering questions appropriately, although answers are slowed.  Valium was ordered for anxiety.  Workup was initiated.  Lab work is notable for a  leukocytosis and a hypokalemia.  Replacement potassium was ordered.  No source of infection is identified.  Patient did undergo MRI of brain which did not show acute findings.  On reassessment, patient endorsing pain in right thigh consistent with muscle cramp.  She was given Robaxin.  IV fluids were ordered for hydration.  When attempting to place IV catheter, patient refused.  She was given water for oral hydration.  She did have resolution of symptoms while in the  ED.  She was discharged in stable condition. I ordered medication including potassium chloride for hypokalemia; Valium for muscle cramps and anxiety; Robaxin for muscle cramps Reevaluation of the patient after these medicines showed that the patient improved I have reviewed the patients home medicines and have made adjustments as needed   Social Determinants of Health:  Has PCP         Final Clinical Impression(s) / ED Diagnoses Final diagnoses:  Muscle cramps    Rx / DC Orders ED Discharge Orders          Ordered    methocarbamol (ROBAXIN) 500 MG tablet  Every 8 hours PRN        12/23/22 1926              Gloris Manchester, MD 12/23/22 1936

## 2022-12-23 NOTE — ED Triage Notes (Addendum)
Pt came in via POV d/t beginning to have muscle spasms in bil upper legs after eating lunch about 45 minutes before arrival. Daughter at bedside reports she massaged her legs for a while before she was able to get out of the car. Pt reports feeling weak & her face was having spasms & mouth pulling up to the Lt. A/Ox4, denies pain while in triage. Also having hot flashes & some SOB, is able to follow commands, sensation intact.

## 2022-12-23 NOTE — ED Notes (Addendum)
I was able to get a IV placed in PT's right forearm but she stated she did not want a IV in her arm,she needed it put in her hand so I removed it.

## 2022-12-23 NOTE — ED Notes (Signed)
I had a EMT attempt to get a line and she jumped and said it hurt.

## 2022-12-23 NOTE — Discharge Instructions (Addendum)
Test results today are reassuring.  Your potassium was low but this was replaced in the ER.  A prescription for a muscle relaxer was sent to your pharmacy.  Take as needed.  Stay hydrated.  Return to the emergency department for any new or worsening symptoms of concern.

## 2022-12-25 ENCOUNTER — Telehealth: Payer: Self-pay

## 2022-12-25 NOTE — Transitions of Care (Post Inpatient/ED Visit) (Signed)
   12/25/2022  Name: Teresa Faulkner MRN: [id number] DOB: 07-30-1965  Today's TOC FU Call Status: Today's TOC FU Call Status:: Successful TOC FU Call Completed TOC FU Call Complete Date: 12/25/22  Transition Care Management Follow-up Telephone Call Date of Discharge: 12/23/22 Discharge Facility: [name]) Type of Discharge: Emergency Department How have you been since you were released from the hospital?: Better Any questions or concerns?: No  Items Reviewed: Did you receive and understand the discharge instructions provided?: Yes Medications obtained,verified, and reconciled?: Yes (Medications Reviewed) Any new allergies since your discharge?: No Dietary orders reviewed?: Yes Do you have support at home?: Yes People in Home: child(ren), adult  Medications Reviewed Today: Medications Reviewed Today   Medications were not reviewed in this encounter     Home Care and Equipment/Supplies: Were Home Health Services Ordered?: No Any new equipment or medical supplies ordered?: No  Functional Questionnaire: Do you need assistance with bathing/showering or dressing?: No Do you need assistance with meal preparation?: No Do you need assistance with eating?: No Do you have difficulty maintaining continence: No Do you need assistance with getting out of bed/getting out of a chair/moving?: No Do you have difficulty managing or taking your medications?: No  Follow up appointments reviewed: PCP Follow-up appointment confirmed?: Yes Date of PCP follow-up appointment?: 12/26/22 Follow-up Provider: j [facility] Follow-up appointment confirmed?: NA Do you need transportation to your follow-up appointment?: No Do you understand care options if your condition(s) worsen?: Yes-patient verbalized understanding    SIGNATURE tb,cma

## 2022-12-26 ENCOUNTER — Ambulatory Visit (INDEPENDENT_AMBULATORY_CARE_PROVIDER_SITE_OTHER): Payer: Commercial Managed Care - PPO | Admitting: Nurse Practitioner

## 2022-12-26 ENCOUNTER — Encounter: Payer: Self-pay | Admitting: Nurse Practitioner

## 2022-12-26 VITALS — BP 120/70 | HR 84 | Temp 98.7°F | Ht 69.0 in | Wt 214.8 lb

## 2022-12-26 DIAGNOSIS — R109 Unspecified abdominal pain: Secondary | ICD-10-CM

## 2022-12-26 DIAGNOSIS — E7800 Pure hypercholesterolemia, unspecified: Secondary | ICD-10-CM | POA: Diagnosis not present

## 2022-12-26 DIAGNOSIS — E6609 Other obesity due to excess calories: Secondary | ICD-10-CM

## 2022-12-26 DIAGNOSIS — R7309 Other abnormal glucose: Secondary | ICD-10-CM | POA: Diagnosis not present

## 2022-12-26 DIAGNOSIS — Z6831 Body mass index (BMI) 31.0-31.9, adult: Secondary | ICD-10-CM

## 2022-12-26 DIAGNOSIS — Z2821 Immunization not carried out because of patient refusal: Secondary | ICD-10-CM | POA: Diagnosis not present

## 2022-12-26 DIAGNOSIS — R252 Cramp and spasm: Secondary | ICD-10-CM | POA: Diagnosis not present

## 2022-12-26 LAB — BMP8+EGFR
BUN/Creatinine Ratio: 13 (ref 9–23)
BUN: 11 mg/dL (ref 6–24)
CO2: 24 mmol/L (ref 20–29)
Calcium: 9.9 mg/dL (ref 8.7–10.2)
Chloride: 108 mmol/L — ABNORMAL HIGH (ref 96–106)
Creatinine, Ser: 0.88 mg/dL (ref 0.57–1.00)
Glucose: 93 mg/dL (ref 70–99)
Potassium: 5.1 mmol/L (ref 3.5–5.2)
Sodium: 145 mmol/L — ABNORMAL HIGH (ref 134–144)
eGFR: 78 mL/min/{1.73_m2} (ref >59)

## 2022-12-26 LAB — LIPID PANEL
Chol/HDL Ratio: 3.8 ratio (ref 0.0–4.4)
Cholesterol, Total: 161 mg/dL (ref 100–199)
HDL: 42 mg/dL (ref >39)
LDL Chol Calc (NIH): 102 mg/dL — ABNORMAL HIGH (ref 0–99)
Triglycerides: 92 mg/dL (ref 0–149)
VLDL Cholesterol Cal: 17 mg/dL (ref 5–40)

## 2022-12-26 LAB — HEMOGLOBIN A1C
Est. average glucose Bld gHb Est-mCnc: 126 mg/dL
Hgb A1c MFr Bld: 6 % — ABNORMAL HIGH (ref 4.8–5.6)

## 2022-12-26 NOTE — Assessment & Plan Note (Signed)
Declines shingrix, educated on disease process and is aware if he changes his mind to notify office  

## 2022-12-26 NOTE — Assessment & Plan Note (Signed)
She is encouraged to strive for BMI less than 30 to decrease cardiac risk. Advised to aim for at least 150 minutes of exercise per week.  

## 2022-12-26 NOTE — Assessment & Plan Note (Signed)
Will check lipid panel advised to limit intake of bread/sweets and fried foods.

## 2022-12-26 NOTE — Progress Notes (Signed)
Madelaine Bhat, CMA,acting as a Neurosurgeon for Arnette Felts, FNP.,have documented all relevant documentation on the behalf of Arnette Felts, FNP,as directed by  Arnette Felts, FNP while in the presence of Arnette Felts, FNP.  Subjective:  Patient ID: Teresa Faulkner , female    DOB: Jun 07, 1965 , 57 y.o.   MRN: [id number]  Chief Complaint  Patient presents with   Hospitalization Follow-up    HPI  Patient presents today for a hospital follow up, Patient reports compliance with medications. Patient denies any chest pain, SOB, or headaches. Patient has no other concerns today. Patient went to hospital on 12/23/2022 for muscle cramps, it hit her so hard she could not move due to the pain she was "hyperventilating", patient reports she is still having pain. Patient reports her pain is in her left thigh and the left side of her back. She was having difficulty with speaking as well. She also had a pulling to her mouth during that ER visit on the left. Prior to her visit she had been moving. She will generally drink about 64 oz a day uses a "Cirkul" bottle. She did not get the IV fluids and Robaxin. She has been drinking more water and gatorade since being home. She continues to take robaxin since her ER visit.   Patient reports she doesn't think she can get a urine sample today- urinalysis was done at hospital. She is out of work until Wednesday.   BP Readings from Last 3 Encounters: 12/26/22 : 120/70 12/23/22 : 131/69 09/05/22 : 134/70       Past Medical History:  Diagnosis Date   Anxiety    Arthritis    Bursitis    GERD (gastroesophageal reflux disease)    Hernia, hiatal    Hx of trichomoniasis 2017   Migraine      Family History  Problem Relation Age of Onset   Diabetes Mother    Diabetes Maternal Aunt    Hypertension Maternal Aunt    Breast cancer Other 35       Stage IV     Current Outpatient Medications:    methocarbamol (ROBAXIN) 500 MG tablet, Take 1 tablet (500 mg total) by  mouth every 8 (eight) hours as needed for muscle spasms., Disp: 20 tablet, Rfl: 0   meclizine (ANTIVERT) 12.5 MG tablet, Take 1 tablet (12.5 mg total) by mouth 3 (three) times daily as needed for dizziness. (Patient not taking: Reported on 12/26/2022), Disp: 30 tablet, Rfl: 0   mometasone (NASONEX) 50 MCG/ACT nasal spray, Place 2 sprays into the nose daily. (Patient not taking: Reported on 12/26/2022), Disp: 1 each, Rfl: 2   omeprazole (PRILOSEC) 40 MG capsule, Take by mouth. (Patient not taking: Reported on 12/26/2022), Disp: , Rfl:    Allergies  Allergen Reactions   Aspirin Other (See Comments)    unknown   Aspirin-Acetaminophen Itching     Review of Systems  Constitutional: Negative.   HENT: Negative.    Eyes: Negative.   Respiratory: Negative.    Cardiovascular: Negative.   Gastrointestinal: Negative.   Musculoskeletal:  Positive for back pain (flank pain) and myalgias.  Neurological: Negative.   Psychiatric/Behavioral: Negative.       Today's Vitals   12/26/22 0953  BP: 120/70  Pulse: 84  Temp: 98.7 F (37.1 C)  TempSrc: Oral  Weight: 214 lb 12.8 oz (97.4 kg)  Height: 5\' 9"  (1.753 m)  PainSc: 5   PainLoc: Leg   Body mass index is 31.72 kg/m.  Wt Readings from Last 3 Encounters:  12/26/22 214 lb 12.8 oz (97.4 kg)  12/23/22 217 lb 2.5 oz (98.5 kg)  09/05/22 217 lb 3.2 oz (98.5 kg)     Objective:  Physical Exam Vitals reviewed.  Constitutional:      General: She is not in acute distress.    Appearance: Normal appearance. She is obese.  Cardiovascular:     Rate and Rhythm: Normal rate and regular rhythm.     Pulses: Normal pulses.     Heart sounds: Normal heart sounds. No murmur heard. Pulmonary:     Effort: Pulmonary effort is normal. No respiratory distress.     Breath sounds: Normal breath sounds. No wheezing.  Musculoskeletal:        General: Tenderness (left thigh muscle) present. No swelling.  Skin:    General: Skin is warm and dry.  Neurological:      General: No focal deficit present.     Mental Status: She is alert and oriented to person, place, and time.     Cranial Nerves: No cranial nerve deficit.     Motor: No weakness.  Psychiatric:        Mood and Affect: Mood normal.        Behavior: Behavior normal.        Thought Content: Thought content normal.        Judgment: Judgment normal.         Assessment And Plan:  Muscle cramps Assessment & Plan: ER f/u done, likely related to recent move and being dehydrated. Take magnesium and tumeric supplement and can try Blu Emu cream. Encouraged to stretch regularly as well.    Abnormal glucose Assessment & Plan: She did not get her labs done at her last visit, will check her HgbA1c.   Orders: -     Hemoglobin A1c  Elevated cholesterol Assessment & Plan: Will check lipid panel advised to limit intake of bread/sweets and fried foods.   Orders: -     Lipid panel -     BMP8+eGFR  Flank pain Assessment & Plan: Likely related to her muscle spasms, urinalysis was negative for bacteria. However had protein in her urine. Encouraged to stay well hydrated with water. She was unable to get a urine this visit. Will recheck at next visit in November   Class 1 obesity due to excess calories with body mass index (BMI) of 31.0 to 31.9 in adult, unspecified whether serious comorbidity present Assessment & Plan: She is encouraged to strive for BMI less than 30 to decrease cardiac risk. Advised to aim for at least 150 minutes of exercise per week.    Herpes zoster vaccination declined Assessment & Plan: Declines shingrix, educated on disease process and is aware if he changes his mind to notify office      Return if symptoms worsen or fail to improve.  Patient was given opportunity to ask questions. Patient verbalized understanding of the plan and was able to repeat key elements of the plan. All questions were answered to their satisfaction.    Jeanell Sparrow, FNP, have reviewed  all documentation for this visit. The documentation on 12/26/22 for the exam, diagnosis, procedures, and orders are all accurate and complete.   IF YOU HAVE BEEN REFERRED TO A SPECIALIST, IT MAY TAKE 1-2 WEEKS TO SCHEDULE/PROCESS THE REFERRAL. IF YOU HAVE NOT HEARD FROM US/SPECIALIST IN TWO WEEKS, PLEASE GIVE [place] A CALL AT [phone].

## 2022-12-26 NOTE — Assessment & Plan Note (Signed)
She did not get her labs done at her last visit, will check her HgbA1c.

## 2022-12-26 NOTE — Assessment & Plan Note (Signed)
Likely related to her muscle spasms, urinalysis was negative for bacteria. However had protein in her urine. Encouraged to stay well hydrated with water. She was unable to get a urine this visit. Will recheck at next visit in November

## 2022-12-26 NOTE — Assessment & Plan Note (Addendum)
ER f/u done, likely related to recent move and being dehydrated. Take magnesium and tumeric supplement and can try Blu Emu cream. Encouraged to stretch regularly as well.

## 2022-12-26 NOTE — Patient Instructions (Signed)
I recommend taking magnesium supplement and tumeric supplement for your muscle cramps Increase your intake of potassium rich foods such as oranges, green leafy vegetables, beans and sweet potato

## 2022-12-28 ENCOUNTER — Encounter: Payer: Commercial Managed Care - PPO | Admitting: Nurse Practitioner

## 2023-03-16 DIAGNOSIS — Z1231 Encounter for screening mammogram for malignant neoplasm of breast: Secondary | ICD-10-CM

## 2023-03-27 ENCOUNTER — Other Ambulatory Visit (HOSPITAL_COMMUNITY): Payer: Self-pay

## 2023-03-27 ENCOUNTER — Telehealth: Payer: Commercial Managed Care - PPO | Admitting: Nurse Practitioner

## 2023-03-27 DIAGNOSIS — J069 Acute upper respiratory infection, unspecified: Secondary | ICD-10-CM | POA: Diagnosis not present

## 2023-03-27 MED ORDER — ALBUTEROL SULFATE HFA 108 (90 BASE) MCG/ACT IN AERS
2.0000 | INHALATION_SPRAY | Freq: Four times a day (QID) | RESPIRATORY_TRACT | 0 refills | Status: AC | PRN
Start: 2023-03-27 — End: ?
  Filled 2023-03-27: qty 6.7, 30d supply, fill #0

## 2023-03-27 MED ORDER — PSEUDOEPH-BROMPHEN-DM 30-2-10 MG/5ML PO SYRP
5.0000 mL | ORAL_SOLUTION | Freq: Four times a day (QID) | ORAL | 0 refills | Status: DC | PRN
Start: 2023-03-27 — End: 2024-03-10
  Filled 2023-03-27: qty 120, 6d supply, fill #0

## 2023-03-27 NOTE — Progress Notes (Signed)
Virtual Visit Consent   LAKAIA Faulkner, you are scheduled for a virtual visit with a [facility] provider today. Just as with appointments in the office, your consent must be obtained to participate. Your consent will be active for this visit and any virtual visit you may have with one of our providers in the next 365 days. If you have a MyChart account, a copy of this consent can be sent to you electronically.  As this is a virtual visit, video technology does not allow for your provider to perform a traditional examination. This may limit your provider's ability to fully assess your condition. If your provider identifies any concerns that need to be evaluated in person or the need to arrange testing (such as labs, EKG, etc.), we will make arrangements to do so. Although advances in technology are sophisticated, we cannot ensure that it will always work on either your end or our end. If the connection with a video visit is poor, the visit may have to be switched to a telephone visit. With either a video or telephone visit, we are not always able to ensure that we have a secure connection.  By engaging in this virtual visit, you consent to the provision of healthcare and authorize for your insurance to be billed (if applicable) for the services provided during this visit. Depending on your insurance coverage, you may receive a charge related to this service.  I need to obtain your verbal consent now. Are you willing to proceed with your visit today? TAEA DELANGE has provided verbal consent on 03/27/2023 for a virtual visit (video or telephone). Viviano Simas, FNP  Date: 03/27/2023 8:35 AM  Virtual Visit via Video Note   I, Viviano Simas, connected with  Teresa Faulkner  ([id number], 1966-02-21) on 03/27/23 at  8:45 AM EST by a video-enabled telemedicine application and verified that I am speaking with the correct person using two identifiers.  Location: Patient: Virtual Visit Location Patient:  Home Provider: Virtual Visit Location Provider: Home Office   I discussed the limitations of evaluation and management by telemedicine and the availability of in person appointments. The patient expressed understanding and agreed to proceed.    History of Present Illness: Teresa Faulkner is a 57 y.o. 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   She has been vaccinated for the flu this season    Problems:  Patient Active Problem List   Diagnosis Date Noted   Flank pain 12/26/2022   Elevated cholesterol 12/26/2022   Class 1 obesity due to excess calories with body mass index (BMI) of 31.0 to 31.9 in adult 12/26/2022   Herpes zoster vaccination declined 12/26/2022   Muscle cramps 12/26/2022   Vertigo 09/19/2022   Nasal congestion 09/19/2022   Abnormal glucose 09/19/2022   Dyspnea on exertion 09/19/2022   Class 1 obesity due to excess calories with body mass index (BMI) of  32.0 to 32.9 in adult 09/19/2022   Hallux rigidus of both feet 08/28/2021   Calcaneal spur 08/28/2021   Urinary frequency 06/25/2019   Change in bowel movement 05/28/2019   Gastroesophageal reflux disease without esophagitis 05/28/2019   Encounter for screening for metabolic disorder 05/28/2019   Low back pain with left-sided sciatica 10/11/2016   Abdominal pain 05/22/2016   Hiatal hernia 05/22/2016   Chronic headaches 05/22/2016   Menopause 05/22/2016    Allergies:  Allergies  Allergen Reactions   Aspirin Other (See Comments)    unknown    Aspirin-Acetaminophen Itching   Medications:  Current Outpatient Medications:    meclizine (ANTIVERT) 12.5 MG tablet, Take 1 tablet (12.5 mg total) by mouth 3 (three) times daily as needed for dizziness. (Patient not taking: Reported on 12/26/2022), Disp: 30 tablet, Rfl: 0   methocarbamol (ROBAXIN) 500 MG tablet, Take 1 tablet (500 mg total) by mouth every 8 (eight) hours as needed for muscle spasms., Disp: 20 tablet, Rfl: 0   mometasone (NASONEX) 50 MCG/ACT nasal spray, Place 2 sprays into the nose daily. (Patient not taking: Reported on 12/26/2022), Disp: 1 each, Rfl: 2   omeprazole (PRILOSEC) 40 MG capsule, Take by mouth. (Patient not taking: Reported on 12/26/2022), Disp: , Rfl:   Observations/Objective: Patient is well-developed, well-nourished in no acute distress.  Resting comfortably  at home.  Head is normocephalic, atraumatic.  No labored breathing.  Speech is clear and coherent with logical content.  Patient is alert and oriented at baseline.    Assessment and Plan:  1. Viral URI  Continue immune support with Vitamin C  Push fluids  Assure caloric intake   May consider retesting for COVID tonight or tomorrow   Meds ordered this encounter  Medications   albuterol (VENTOLIN HFA) 108 (90 Base) MCG/ACT inhaler    Sig: Inhale 2 puffs into the lungs every 6 (six) hours as needed for wheezing or shortness of breath.    Dispense:  8 g    Refill:  0   brompheniramine-pseudoephedrine-DM 30-2-10 MG/5ML syrup    Sig: Take 5 mLs by mouth 4 (four) times daily as needed (cough and congestion).    Dispense:  120 mL    Refill:  0          Follow Up Instructions: I discussed the assessment and treatment plan with the patient. The patient was provided an opportunity to ask questions and all were answered. The patient agreed with the plan and demonstrated an understanding of the instructions.  A copy of instructions were sent to the patient via MyChart unless otherwise noted below.     The patient was advised to call back or seek an in-person evaluation if the symptoms worsen or if the condition fails to improve as anticipated.    Viviano Simas, FNP

## 2023-03-29 ENCOUNTER — Encounter: Payer: Self-pay | Admitting: Nurse Practitioner

## 2023-04-04 ENCOUNTER — Encounter: Payer: Commercial Managed Care - PPO | Admitting: Nurse Practitioner

## 2023-09-21 ENCOUNTER — Telehealth: Admitting: Nurse Practitioner

## 2023-09-21 DIAGNOSIS — R42 Dizziness and giddiness: Secondary | ICD-10-CM

## 2023-09-21 NOTE — Progress Notes (Signed)
  Because your symptoms are continuing without improvement and you may need labs completed, I feel your condition warrants further evaluation and I recommend that you be seen in a face-to-face visit.   NOTE: There will be NO CHARGE for this E-Visit   If you are having a true medical emergency, please call 911.     For an urgent face to face visit, [facility] has multiple urgent care centers for your convenience.  Click the link below for the full list of locations and hours, walk-in wait times, appointment scheduling options and driving directions:  Urgent Care - [place], [place], [place], [place], [place], [place]  [place]     Your MyChart E-visit questionnaire answers were reviewed by a board certified advanced clinical practitioner to complete your personal care plan based on your specific symptoms.    Thank you for using e-Visits.

## 2023-10-25 ENCOUNTER — Encounter: Payer: Self-pay | Admitting: Nurse Practitioner

## 2023-10-25 ENCOUNTER — Ambulatory Visit: Admitting: Nurse Practitioner

## 2023-10-25 VITALS — BP 110/70 | HR 84 | Temp 98.2°F | Ht 69.0 in | Wt 223.0 lb

## 2023-10-25 DIAGNOSIS — R42 Dizziness and giddiness: Secondary | ICD-10-CM

## 2023-10-25 DIAGNOSIS — E6609 Other obesity due to excess calories: Secondary | ICD-10-CM | POA: Diagnosis not present

## 2023-10-25 DIAGNOSIS — E66811 Obesity, class 1: Secondary | ICD-10-CM

## 2023-10-25 DIAGNOSIS — Z6832 Body mass index (BMI) 32.0-32.9, adult: Secondary | ICD-10-CM

## 2023-10-25 MED ORDER — AMOXICILLIN 875 MG PO TABS: 875.0000 mg | ORAL_TABLET | Freq: Two times a day (BID) | ORAL | 0 refills | Status: DC

## 2023-10-25 NOTE — Progress Notes (Signed)
 LILLETTE Kristeen JINNY Gladis, CMA,acting as a Neurosurgeon for Teresa Ada, FNP.,have documented all relevant documentation on the behalf of Teresa Ada, FNP,as directed by  Teresa Ada, FNP while in the presence of Teresa Ada, FNP.  Subjective:  Patient ID: Teresa Faulkner , female    DOB: Jun 29, 1965 , 58 y.o.   MRN: [id number]  Chief Complaint  Patient presents with   Dizziness    Patient presents today for dizziness. She reports the dizziness started about a month ago. She reports she also just feels so tired. Patient reports the dizziness is worse in the evenings.    HPI  Patient presents for dizziness.  Recent frequent headaches, weekly, the only thing that makes the HA go away is going to sleep, tylenol  does not help.  Episodes last for one hour at a time, she feels as if she is going to fall.  Lying down makes the dizziness worst. No ringing in ears.  Her perception is that she is spinning when this occurs.  She has tried meclizine  for the issue and it offered mild relief but made her very sleepy.  Drinking water daily, 50-64 oz.    Dizziness This is a chronic problem. The current episode started more than 1 month ago. The problem occurs intermittently. The problem has been unchanged. Associated symptoms include headaches and vertigo. Pertinent negatives include no chest pain, chills, congestion, coughing, nausea, visual change or vomiting. The symptoms are aggravated by walking. She has tried lying down and position changes for the symptoms. The treatment provided no relief.     Past Medical History:  Diagnosis Date   Anxiety    Arthritis    Bursitis    GERD (gastroesophageal reflux disease)    Hernia, hiatal    Hx of trichomoniasis 2017   Migraine      Family History  Problem Relation Age of Onset   Diabetes Mother    Diabetes Maternal Aunt    Hypertension Maternal Aunt    Breast cancer Other 35       Stage IV     Current Outpatient Medications:    albuterol  (VENTOLIN  HFA) 108 (90  Base) MCG/ACT inhaler, Inhale 2 puffs into the lungs every 6 (six) hours as needed for wheezing or shortness of breath., Disp: 8 g, Rfl: 0   amoxicillin  (AMOXIL ) 875 MG tablet, Take 1 tablet (875 mg total) by mouth 2 (two) times daily., Disp: 14 tablet, Rfl: 0   brompheniramine-pseudoephedrine -DM 30-2-10 MG/5ML syrup, Take 5 mLs by mouth 4 (four) times daily as needed (cough and congestion)., Disp: 120 mL, Rfl: 0   Allergies  Allergen Reactions   Aspirin Other (See Comments)    unknown   Aspirin-Acetaminophen  Itching     Review of Systems  Constitutional:  Negative for chills.  HENT:  Negative for congestion, ear discharge and ear pain.   Eyes:  Negative for photophobia and visual disturbance.  Respiratory:  Negative for cough.   Cardiovascular:  Negative for chest pain and palpitations.  Gastrointestinal:  Negative for constipation, diarrhea, nausea and vomiting.  Endocrine: Negative for polydipsia, polyphagia and polyuria.  Neurological:  Positive for dizziness, vertigo, light-headedness and headaches.     Today's Vitals   10/25/23 1605 10/25/23 1610  BP:  110/70  Pulse:  84  Temp: 98.2 F (36.8 C)   TempSrc: Oral   Weight: 223 lb (101.2 kg)   Height: 5' 9 (1.753 m)   PainSc: 0-No pain    Body mass index is 32.93 kg/m.  Wt Readings from Last 3 Encounters:  10/25/23 223 lb (101.2 kg)  12/26/22 214 lb 12.8 oz (97.4 kg)  12/23/22 217 lb 2.5 oz (98.5 kg)     Objective:  Physical Exam Constitutional:      Appearance: Normal appearance. She is obese.  HENT:     Head: Normocephalic and atraumatic.     Right Ear: Tympanic membrane is bulging.     Left Ear: Tympanic membrane is bulging.   Eyes:     Extraocular Movements: Extraocular movements intact.     Conjunctiva/sclera: Conjunctivae normal.     Pupils: Pupils are equal, round, and reactive to light.   Neck:     Vascular: No carotid bruit.   Cardiovascular:     Rate and Rhythm: Normal rate and regular rhythm.      Pulses: Normal pulses.     Heart sounds: Normal heart sounds.  Pulmonary:     Effort: Pulmonary effort is normal.     Breath sounds: Normal breath sounds.   Musculoskeletal:        General: Normal range of motion.     Cervical back: Normal range of motion.     Right lower leg: No edema.     Left lower leg: No edema.  Lymphadenopathy:     Cervical: No cervical adenopathy.   Skin:    Capillary Refill: Capillary refill takes less than 2 seconds.   Neurological:     General: No focal deficit present.     Mental Status: She is alert and oriented to person, place, and time.   Psychiatric:        Mood and Affect: Mood normal.        Behavior: Behavior normal.        Thought Content: Thought content normal.        Judgment: Judgment normal.      Assessment And Plan:  Vertigo Assessment & Plan: TSH, CBC and CMPeGFR drawn for evaluation.  May be related to mild TM bulging.  Amoxicillin  ordered.    Orders: -     TSH -     CBC with Differential/Platelet -     CMP14+EGFR  Class 1 obesity due to excess calories with body mass index (BMI) of 32.0 to 32.9 in adult, unspecified whether serious comorbidity present Assessment & Plan: She is encouraged to strive for BMI less than 30 to decrease cardiac risk. Advised to aim for at least 150 minutes of exercise per week.   Orders: -     Amb Ref to Medical Weight Management  Other orders -     Amoxicillin ; Take 1 tablet (875 mg total) by mouth 2 (two) times daily.  Dispense: 14 tablet; Refill: 0    Return if symptoms worsen or fail to improve.  Patient was given opportunity to ask questions. Patient verbalized understanding of the plan and was able to repeat key elements of the plan. All questions were answered to their satisfaction.    LILLETTE Teresa Ada, FNP, have reviewed all documentation for this visit. The documentation on 10/25/23 for the exam, diagnosis, procedures, and orders are all accurate and complete.   IF YOU HAVE  BEEN REFERRED TO A SPECIALIST, IT MAY TAKE 1-2 WEEKS TO SCHEDULE/PROCESS THE REFERRAL. IF YOU HAVE NOT HEARD FROM US /SPECIALIST IN TWO WEEKS, PLEASE GIVE US  A CALL AT [phone].

## 2023-10-25 NOTE — Assessment & Plan Note (Signed)
 TSH, CBC and CMPeGFR drawn for evaluation.  May be related to mild TM bulging.  Amoxicillin  ordered.

## 2023-10-26 LAB — CMP14+EGFR
ALT: 15 IU/L (ref 0–32)
AST: 15 IU/L (ref 0–40)
Albumin: 4 g/dL (ref 3.8–4.9)
Alkaline Phosphatase: 81 IU/L (ref 44–121)
BUN/Creatinine Ratio: 23 (ref 9–23)
BUN: 18 mg/dL (ref 6–24)
Bilirubin Total: <0.2 mg/dL (ref 0.0–1.2)
CO2: 22 mmol/L (ref 20–29)
Calcium: 9.4 mg/dL (ref 8.7–10.2)
Chloride: 107 mmol/L — ABNORMAL HIGH (ref 96–106)
Creatinine, Ser: 0.8 mg/dL (ref 0.57–1.00)
Globulin, Total: 2.4 g/dL (ref 1.5–4.5)
Glucose: 96 mg/dL (ref 70–99)
Potassium: 4.4 mmol/L (ref 3.5–5.2)
Sodium: 144 mmol/L (ref 134–144)
Total Protein: 6.4 g/dL (ref 6.0–8.5)
eGFR: 86 mL/min/{1.73_m2} (ref >59)

## 2023-10-26 LAB — CBC WITH DIFFERENTIAL/PLATELET
Basophils Absolute: 0.1 10*3/uL (ref 0.0–0.2)
Basos: 1 %
EOS (ABSOLUTE): 0.2 10*3/uL (ref 0.0–0.4)
Eos: 2 %
Hematocrit: 41.8 % (ref 34.0–46.6)
Hemoglobin: 12.9 g/dL (ref 11.1–15.9)
Immature Grans (Abs): 0 10*3/uL (ref 0.0–0.1)
Immature Granulocytes: 0 %
Lymphocytes Absolute: 3.9 10*3/uL — ABNORMAL HIGH (ref 0.7–3.1)
Lymphs: 42 %
MCH: 27.9 pg (ref 26.6–33.0)
MCHC: 30.9 g/dL — ABNORMAL LOW (ref 31.5–35.7)
MCV: 90 fL (ref 79–97)
Monocytes Absolute: 0.6 10*3/uL (ref 0.1–0.9)
Monocytes: 7 %
Neutrophils Absolute: 4.6 10*3/uL (ref 1.4–7.0)
Neutrophils: 48 %
Platelets: 285 10*3/uL (ref 150–450)
RBC: 4.63 x10E6/uL (ref 3.77–5.28)
RDW: 13.1 % (ref 11.7–15.4)
WBC: 9.3 10*3/uL (ref 3.4–10.8)

## 2023-10-26 LAB — TSH: TSH: 2.06 u[IU]/mL (ref 0.450–4.500)

## 2023-11-04 ENCOUNTER — Ambulatory Visit: Payer: Self-pay | Admitting: Nurse Practitioner

## 2023-11-04 ENCOUNTER — Encounter: Payer: Self-pay | Admitting: Nurse Practitioner

## 2023-11-04 NOTE — Assessment & Plan Note (Signed)
 She is encouraged to strive for BMI less than 30 to decrease cardiac risk. Advised to aim for at least 150 minutes of exercise per week.

## 2023-11-05 ENCOUNTER — Other Ambulatory Visit: Payer: Self-pay | Admitting: Nurse Practitioner

## 2023-11-05 ENCOUNTER — Other Ambulatory Visit (HOSPITAL_COMMUNITY): Payer: Self-pay

## 2023-11-05 MED ORDER — AMOXICILLIN 875 MG PO TABS
875.0000 mg | ORAL_TABLET | Freq: Two times a day (BID) | ORAL | 0 refills | Status: DC
  Filled 2023-11-05: qty 14, 7d supply, fill #0

## 2023-11-07 ENCOUNTER — Ambulatory Visit: Admitting: Nurse Practitioner

## 2023-12-07 ENCOUNTER — Other Ambulatory Visit (HOSPITAL_COMMUNITY): Payer: Self-pay

## 2023-12-07 DIAGNOSIS — Z01419 Encounter for gynecological examination (general) (routine) without abnormal findings: Secondary | ICD-10-CM | POA: Diagnosis not present

## 2023-12-07 DIAGNOSIS — L282 Other prurigo: Secondary | ICD-10-CM | POA: Diagnosis not present

## 2023-12-07 DIAGNOSIS — Z6834 Body mass index (BMI) 34.0-34.9, adult: Secondary | ICD-10-CM | POA: Diagnosis not present

## 2023-12-07 MED ORDER — NYSTATIN-TRIAMCINOLONE 100000-0.1 UNIT/GM-% EX OINT
1.0000 | TOPICAL_OINTMENT | Freq: Two times a day (BID) | CUTANEOUS | 1 refills | Status: DC | PRN
  Filled 2023-12-07: qty 30, 15d supply, fill #0

## 2024-03-05 ENCOUNTER — Other Ambulatory Visit (HOSPITAL_COMMUNITY): Payer: Self-pay

## 2024-03-10 ENCOUNTER — Telehealth: Admitting: Emergency Medicine

## 2024-03-10 ENCOUNTER — Other Ambulatory Visit (HOSPITAL_COMMUNITY): Payer: Self-pay

## 2024-03-10 ENCOUNTER — Ambulatory Visit (HOSPITAL_COMMUNITY)
Admission: EM | Admit: 2024-03-10 | Discharge: 2024-03-10 | Disposition: A | Discharge status: Home / Self Care | Attending: Emergency Medicine | Admitting: Emergency Medicine

## 2024-03-10 ENCOUNTER — Encounter (HOSPITAL_COMMUNITY): Payer: Self-pay | Admitting: Emergency Medicine

## 2024-03-10 DIAGNOSIS — R0602 Shortness of breath: Secondary | ICD-10-CM

## 2024-03-10 DIAGNOSIS — B349 Viral infection, unspecified: Secondary | ICD-10-CM | POA: Diagnosis not present

## 2024-03-10 DIAGNOSIS — R051 Acute cough: Secondary | ICD-10-CM

## 2024-03-10 DIAGNOSIS — K0889 Other specified disorders of teeth and supporting structures: Secondary | ICD-10-CM

## 2024-03-10 MED ORDER — AMOXICILLIN-POT CLAVULANATE 875-125 MG PO TABS
1.0000 | ORAL_TABLET | Freq: Two times a day (BID) | ORAL | 0 refills | Status: AC
  Filled 2024-03-10: qty 14, 7d supply, fill #0

## 2024-03-10 MED ORDER — AZELASTINE HCL 0.1 % NA SOLN
2.0000 | Freq: Two times a day (BID) | NASAL | 0 refills | Status: AC
Start: 2024-03-10 — End: ?
  Filled 2024-03-10: qty 30, 25d supply, fill #0

## 2024-03-10 MED ORDER — BENZONATATE 100 MG PO CAPS
100.0000 mg | ORAL_CAPSULE | Freq: Three times a day (TID) | ORAL | 0 refills | Status: AC
  Filled 2024-03-10: qty 21, 7d supply, fill #0

## 2024-03-10 MED ORDER — PROMETHAZINE-DM 6.25-15 MG/5ML PO SYRP
5.0000 mL | ORAL_SOLUTION | Freq: Every evening | ORAL | 0 refills | Status: AC | PRN
  Filled 2024-03-10: qty 118, 23d supply, fill #0

## 2024-03-10 NOTE — Discharge Instructions (Addendum)
 As discussed I believe your symptoms are likely related to a viral illness. You can use azelastine nasal spray twice daily to help with nasal congestion. You can take Tessalon  every 8 hours as needed for cough. You can take Promethazine  DM cough syrup at bedtime as needed for cough.  This can make you drowsy so do not drive, work, or drink alcohol while taking this Otherwise recommend over-the-counter Mucinex  for cough and congestion. Alternate between Tylenol  and ibuprofen  every 6-8 hours as needed for pain or fever. Make sure you are staying hydrated getting plenty of rest.  Start taking Augmentin  twice daily for 7 days for infection coverage due to presence of dental pain. Follow-up with your dentist for further evaluation if your pain continues.  Follow-up with a primary care provider or return here as needed.

## 2024-03-10 NOTE — ED Triage Notes (Signed)
 Pt reports that cough, pain in gums, congestion, and headache that started Friday but got worse yesterday. Taken Alka seltzer cold and flu and another liquid cold and flu medication.

## 2024-03-10 NOTE — ED Provider Notes (Addendum)
 [facility]    CSN: [id number] Arrival date & time: 03/10/24  9093      History   Chief Complaint Chief Complaint  Patient presents with   Cough    HPI Teresa Faulkner is a 58 y.o. female.   Patient present to the cough, congestion, and intermittent headache that began on 10/31.  Patient reports that she is also had intermittent chills and mild diarrhea.  Patient states that her symptoms worsened yesterday.  Patient denies any fever, body aches, shortness of breath, nausea, vomiting, and abdominal pain.  Patient denies any known sick contacts.  Patient states that she has been taking Alka-Seltzer cold and flu as well as another over-the-counter liquid cold and flu medication with minimal relief.  Patient also reports right lower dental pain that began upon waking this morning.  Patient states that she never experienced dental pain before, but this seems to be progressively worsening.  Patient denies any notable swelling or drainage from this area.  Patient reports that she does have a dentist that she can follow-up with.  The history is provided by the patient and medical records.  Cough   Past Medical History:  Diagnosis Date   Anxiety    Arthritis    Asthma    Bursitis    GERD (gastroesophageal reflux disease)    Hernia, hiatal    Hx of trichomoniasis 2017   Migraine     Patient Active Problem List   Diagnosis Date Noted   Flank pain 12/26/2022   Elevated cholesterol 12/26/2022   Class 1 obesity due to excess calories with body mass index (BMI) of 31.0 to 31.9 in adult 12/26/2022   Herpes zoster vaccination declined 12/26/2022   Muscle cramps 12/26/2022   Vertigo 09/19/2022   Nasal congestion 09/19/2022   Abnormal glucose 09/19/2022   Dyspnea on exertion 09/19/2022   Class 1 obesity due to excess calories with body mass index (BMI) of 32.0 to 32.9 in adult 09/19/2022   Hallux rigidus of both feet 08/28/2021   Calcaneal spur 08/28/2021   Urinary  frequency 06/25/2019   Change in bowel movement 05/28/2019   Gastroesophageal reflux disease without esophagitis 05/28/2019   Encounter for screening for metabolic disorder 05/28/2019   Low back pain with left-sided sciatica 10/11/2016   Abdominal pain 05/22/2016   Hiatal hernia 05/22/2016   Chronic headaches 05/22/2016   Menopause 05/22/2016    Past Surgical History:  Procedure Laterality Date   BACK SURGERY     TUBAL LIGATION      OB History     Gravida  2   Para  2   Term  2   Preterm  0   AB  0   Living  2      SAB  0   IAB  0   Ectopic  0   Multiple  0   Live Births               Home Medications    Prior to Admission medications   Medication Sig Start Date End Date Taking? Authorizing Provider  amoxicillin -clavulanate (AUGMENTIN ) 875-125 MG tablet Take 1 tablet by mouth every 12 (twelve) hours. 03/10/24  Yes Johnie, Faith Patricelli A, NP  azelastine (ASTELIN) 0.1 % nasal spray Place 2 sprays into both nostrils 2 (two) times daily. Use in each nostril as directed 03/10/24  Yes [name], NP  benzonatate  (TESSALON ) 100 MG capsule Take 1 capsule (100 mg total) by mouth every 8 (eight)  hours. 03/10/24  Yes [name], NP  promethazine -dextromethorphan (PROMETHAZINE -DM) 6.25-15 MG/5ML syrup Take 5 mLs by mouth at bedtime as needed for cough. 03/10/24  Yes Johnie, Severn Goddard A, NP  albuterol  (VENTOLIN  HFA) 108 (90 Base) MCG/ACT inhaler Inhale 2 puffs into the lungs every 6 (six) hours as needed for wheezing or shortness of breath. 03/27/23   Kennyth Domino, FNP    Family History Family History  Problem Relation Age of Onset   Diabetes Mother    Diabetes Maternal Aunt    Hypertension Maternal Aunt    Breast cancer Other 26       Stage IV    Social History Social History   Tobacco Use   Smoking status: Every Day    Current packs/day: 0.25    Average packs/day: 0.3 packs/day for 20.0 years (5.0 ttl pk-yrs)    Types: Cigarettes   Smokeless  tobacco: Never   Tobacco comments:    now back on Wellbutrin , 09/05/2022 - smoking cessation program through employer.   Substance Use Topics   Alcohol use: No   Drug use: No     Allergies   Aspirin and Aspirin-acetaminophen    Review of Systems Review of Systems  Respiratory:  Positive for cough.    Per HPI  Physical Exam Triage Vital Signs ED Triage Vitals  Encounter Vitals Group     BP 03/10/24 0922 125/65     Girls Systolic BP Percentile --      Girls Diastolic BP Percentile --      Boys Systolic BP Percentile --      Boys Diastolic BP Percentile --      Pulse Rate 03/10/24 0922 89     Resp 03/10/24 0922 19     Temp 03/10/24 0922 98.6 F (37 C)     Temp Source 03/10/24 0922 Oral     SpO2 03/10/24 0922 98 %     Weight --      Height --      Head Circumference --      Peak Flow --      Pain Score 03/10/24 0921 5     Pain Loc --      Pain Education --      Exclude from Growth Chart --    No data found.  Updated Vital Signs BP 125/65 (BP Location: Left Arm)   Pulse 89   Temp 98.6 F (37 C) (Oral)   Resp 19   SpO2 98%   Visual Acuity Right Eye Distance:   Left Eye Distance:   Bilateral Distance:    Right Eye Near:   Left Eye Near:    Bilateral Near:     Physical Exam Vitals and nursing note reviewed.  Constitutional:      General: She is awake. She is not in acute distress.    Appearance: Normal appearance. She is well-developed and well-groomed. She is not ill-appearing.  HENT:     Right Ear: Tympanic membrane, ear canal and external ear normal.     Left Ear: Tympanic membrane, ear canal and external ear normal.     Nose: Congestion and rhinorrhea present.     Mouth/Throat:     Mouth: Mucous membranes are moist.     Dentition: Dental tenderness present.     Pharynx: Posterior oropharyngeal erythema and postnasal drip present. No oropharyngeal exudate.     Comments: Tenderness noted to right lower outer gumline without swelling or  drainage. Cardiovascular:     Rate and  Rhythm: Normal rate and regular rhythm.  Pulmonary:     Effort: Pulmonary effort is normal.     Breath sounds: Normal breath sounds.  Skin:    General: Skin is warm and dry.  Neurological:     Mental Status: She is alert.  Psychiatric:        Behavior: Behavior is cooperative.      UC Treatments / Results  Labs (all labs ordered are listed, but only abnormal results are displayed) Labs Reviewed - No data to display   EKG   Radiology No results found.  Procedures Procedures (including critical care time)  Medications Ordered in UC Medications - No data to display  Initial Impression / Assessment and Plan / UC Course  I have reviewed the triage vital signs and the nursing notes.  Pertinent labs & imaging results that were available during my care of the patient were reviewed by me and considered in my medical decision making (see chart for details).     Patient is overall well-appearing.  Vitals are stable.  Congestion and rhinorrhea are present, erythema and PND noted to posterior oropharynx.  Lungs clear bilaterally.  Some dental tenderness noted to right lower outer without swelling or obvious signs of infection.  Patient refused COVID and flu swab.  Symptoms likely viral in nature.  Prescribed azelastine to help with nasal congestion.  Prescribed Tessalon  and Promethazine  DM for cough.  Discussed over-the-counter medications as needed for symptoms.  Prescribed Augmentin  for dental infection coverage due to presence of dental pain and tenderness.  Discussed following up with dentist for further evaluation.  Discussed follow-up and return precautions. Final Clinical Impressions(s) / UC Diagnoses   Final diagnoses:  Acute cough  Pain, dental  Viral illness     Discharge Instructions      As discussed I believe your symptoms are likely related to a viral illness. You can use azelastine nasal spray twice daily to help with  nasal congestion. You can take Tessalon  every 8 hours as needed for cough. You can take Promethazine  DM cough syrup at bedtime as needed for cough.  This can make you drowsy so do not drive, work, or drink alcohol while taking this Otherwise recommend over-the-counter Mucinex  for cough and congestion. Alternate between Tylenol  and ibuprofen  every 6-8 hours as needed for pain or fever. Make sure you are staying hydrated getting plenty of rest.  Start taking Augmentin  twice daily for 7 days for infection coverage due to presence of dental pain. Follow-up with your dentist for further evaluation if your pain continues.  Follow-up with a primary care provider or return here as needed.     ED Prescriptions     Medication Sig Dispense Auth. Provider   azelastine (ASTELIN) 0.1 % nasal spray Place 2 sprays into both nostrils 2 (two) times daily. Use in each nostril as directed 30 mL [name], NP   promethazine -dextromethorphan (PROMETHAZINE -DM) 6.25-15 MG/5ML syrup Take 5 mLs by mouth at bedtime as needed for cough. 118 mL [name], NP   benzonatate  (TESSALON ) 100 MG capsule Take 1 capsule (100 mg total) by mouth every 8 (eight) hours. 21 capsule [name], NP   amoxicillin -clavulanate (AUGMENTIN ) 875-125 MG tablet Take 1 tablet by mouth every 12 (twelve) hours. 14 tablet [name], NP      PDMP not reviewed this encounter.   Johnie Flaming LABOR, NP 03/10/24 1001    [name], NP 03/10/24 1002    [place], [address]  A, NP 03/10/24 1003

## 2024-03-10 NOTE — Progress Notes (Signed)
 Virtual Visit Consent   Teresa Faulkner, you are scheduled for a virtual visit with a [facility] provider today. Just as with appointments in the office, your consent must be obtained to participate. Your consent will be active for this visit and any virtual visit you may have with one of our providers in the next 365 days. If you have a MyChart account, a copy of this consent can be sent to you electronically.  As this is a virtual visit, video technology does not allow for your provider to perform a traditional examination. This may limit your provider's ability to fully assess your condition. If your provider identifies any concerns that need to be evaluated in person or the need to arrange testing (such as labs, EKG, etc.), we will make arrangements to do so. Although advances in technology are sophisticated, we cannot ensure that it will always work on either your end or our end. If the connection with a video visit is poor, the visit may have to be switched to a telephone visit. With either a video or telephone visit, we are not always able to ensure that we have a secure connection.  By engaging in this virtual visit, you consent to the provision of healthcare and authorize for your insurance to be billed (if applicable) for the services provided during this visit. Depending on your insurance coverage, you may receive a charge related to this service.  I need to obtain your verbal consent now. Are you willing to proceed with your visit today? Teresa Faulkner has provided verbal consent on 03/10/2024 for a virtual visit (video or telephone). Jon CHRISTELLA Belt, NP  Date: 03/10/2024 8:37 AM   Virtual Visit via Video Note   I, Jon CHRISTELLA Belt, connected with  Teresa Faulkner  ([id number], 1965-12-25) on 03/10/24 at  8:30 AM EST by a video-enabled telemedicine application and verified that I am speaking with the correct person using two identifiers.  Location: Patient: Virtual Visit Location  Patient: Home Provider: Virtual Visit Location Provider: Home Office   I discussed the limitations of evaluation and management by telemedicine and the availability of in person appointments. The patient expressed understanding and agreed to proceed.    History of Present Illness: Teresa Faulkner is a 58 y.o. who identifies as a female who was assigned female at birth, and is being seen today for cold sx.   03/07/24 wasn't feeling well at work. Left early, took otc cold medicine. 11/1 felt better, then worse again 11/2. Now coughing, can't pain but no swelling or bad teeth there.   Taking alka selzer cold medicine, bromphed from prior illness.   Has hx of asthma, feels albuterol  is not working sufficiently well to help her breathing, is SOB.   HPI: HPI  Problems:  Patient Active Problem List   Diagnosis Date Noted   Flank pain 12/26/2022   Elevated cholesterol 12/26/2022   Class 1 obesity due to excess calories with body mass index (BMI) of 31.0 to 31.9 in adult 12/26/2022   Herpes zoster vaccination declined 12/26/2022   Muscle cramps 12/26/2022   Vertigo 09/19/2022   Nasal congestion 09/19/2022   Abnormal glucose 09/19/2022   Dyspnea on exertion 09/19/2022   Class 1 obesity due to excess calories with body mass index (BMI) of 32.0 to 32.9 in adult 09/19/2022   Hallux rigidus of both feet 08/28/2021   Calcaneal spur 08/28/2021   Urinary frequency 06/25/2019   Change in bowel movement 05/28/2019  Gastroesophageal reflux disease without esophagitis 05/28/2019   Encounter for screening for metabolic disorder 05/28/2019   Low back pain with left-sided sciatica 10/11/2016   Abdominal pain 05/22/2016   Hiatal hernia 05/22/2016   Chronic headaches 05/22/2016   Menopause 05/22/2016    Allergies:  Allergies  Allergen Reactions   Aspirin Other (See Comments)    unknown   Aspirin-Acetaminophen  Itching   Medications:  Current Outpatient Medications:    albuterol  (VENTOLIN  HFA)  108 (90 Base) MCG/ACT inhaler, Inhale 2 puffs into the lungs every 6 (six) hours as needed for wheezing or shortness of breath., Disp: 8 g, Rfl: 0   amoxicillin  (AMOXIL ) 875 MG tablet, Take 1 tablet (875 mg total) by mouth 2 (two) times daily., Disp: 14 tablet, Rfl: 0   brompheniramine-pseudoephedrine -DM 30-2-10 MG/5ML syrup, Take 5 mLs by mouth 4 (four) times daily as needed (cough and congestion)., Disp: 120 mL, Rfl: 0   nystatin -triamcinolone  ointment (MYCOLOG), Apply 1 Application topically to affected areas 2 (two) times daily as needed., Disp: 30 g, Rfl: 1  Observations/Objective: Patient is well-developed, well-nourished in no acute distress.  Resting comfortably  at home.  Head is normocephalic, atraumatic.  No labored breathing.  Speech is clear and coherent with logical content.  Patient is alert and oriented at baseline.    Assessment and Plan: 1. SOB (shortness of breath) (Primary)  Will need to be checked in person.   Follow Up Instructions: I discussed the assessment and treatment plan with the patient. The patient was provided an opportunity to ask questions and all were answered. The patient agreed with the plan and demonstrated an understanding of the instructions.  A copy of instructions were sent to the patient via MyChart unless otherwise noted below.    The patient was advised to call back or seek an in-person evaluation if the symptoms worsen or if the condition fails to improve as anticipated.    Jon CHRISTELLA Belt, NP

## 2024-03-10 NOTE — Patient Instructions (Signed)
  Teresa Faulkner, thank you for joining Jon CHRISTELLA Belt, NP for today's virtual visit.  While this provider is not your primary care provider (PCP), if your PCP is located in our provider database this encounter information will be shared with them immediately following your visit.   A [facility] MyChart account gives you access to today's visit and all your visits, tests, and labs performed at [facility]  click here if you don't have a [facility] MyChart account or go to [email or web address]  Consent: (Patient) Teresa Faulkner provided verbal consent for this virtual visit at the beginning of the encounter.  Current Medications:  Current Outpatient Medications:    albuterol  (VENTOLIN  HFA) 108 (90 Base) MCG/ACT inhaler, Inhale 2 puffs into the lungs every 6 (six) hours as needed for wheezing or shortness of breath., Disp: 8 g, Rfl: 0   amoxicillin  (AMOXIL ) 875 MG tablet, Take 1 tablet (875 mg total) by mouth 2 (two) times daily., Disp: 14 tablet, Rfl: 0   brompheniramine-pseudoephedrine -DM 30-2-10 MG/5ML syrup, Take 5 mLs by mouth 4 (four) times daily as needed (cough and congestion)., Disp: 120 mL, Rfl: 0   nystatin -triamcinolone  ointment (MYCOLOG), Apply 1 Application topically to affected areas 2 (two) times daily as needed., Disp: 30 g, Rfl: 1   Medications ordered in this encounter:  No orders of the defined types were placed in this encounter.    *If you need refills on other medications prior to your next appointment, please contact your pharmacy*  Follow-Up: Call back or seek an in-person evaluation if the symptoms worsen or if the condition fails to improve as anticipated.  [facility] [phone]  Other Instructions Please be checked in person! I am worried that your inhaler is not working to help your breathing while you are sick.    If you have been instructed to have an in-person evaluation today at a local Urgent Care facility, please  use the link below. It will take you to a list of all of our available [facility], including address, phone number and hours of operation. Please do not delay care.  [facility]  If you or a family member do not have a primary care provider, use the link below to schedule a visit and establish care. When you choose a [facility] primary care physician or advanced practice provider, you gain a long-term partner in health. Find a Primary Care Provider  Learn more about [facility] in-office and virtual care options: [facility] - Get Care Now

## 2024-03-10 NOTE — ED Notes (Signed)
 Patient refused Covid/Flu swab testing. Will make Carley, NP aware.

## 2024-03-13 ENCOUNTER — Other Ambulatory Visit (HOSPITAL_COMMUNITY): Payer: Self-pay

## 2024-03-16 ENCOUNTER — Other Ambulatory Visit: Payer: Self-pay

## 2024-03-16 ENCOUNTER — Emergency Department (HOSPITAL_COMMUNITY)

## 2024-03-16 ENCOUNTER — Encounter

## 2024-03-16 ENCOUNTER — Encounter (HOSPITAL_COMMUNITY): Payer: Self-pay

## 2024-03-16 ENCOUNTER — Emergency Department (HOSPITAL_COMMUNITY)
Admission: EM | Admit: 2024-03-16 | Discharge: 2024-03-16 | Disposition: A | Discharge status: Home / Self Care | Attending: Emergency Medicine | Admitting: Emergency Medicine

## 2024-03-16 DIAGNOSIS — J069 Acute upper respiratory infection, unspecified: Secondary | ICD-10-CM | POA: Insufficient documentation

## 2024-03-16 DIAGNOSIS — R062 Wheezing: Secondary | ICD-10-CM

## 2024-03-16 DIAGNOSIS — R051 Acute cough: Secondary | ICD-10-CM

## 2024-03-16 DIAGNOSIS — R6883 Chills (without fever): Secondary | ICD-10-CM | POA: Diagnosis not present

## 2024-03-16 DIAGNOSIS — R0602 Shortness of breath: Secondary | ICD-10-CM | POA: Diagnosis not present

## 2024-03-16 DIAGNOSIS — R059 Cough, unspecified: Secondary | ICD-10-CM | POA: Diagnosis not present

## 2024-03-16 DIAGNOSIS — J45909 Unspecified asthma, uncomplicated: Secondary | ICD-10-CM | POA: Insufficient documentation

## 2024-03-16 DIAGNOSIS — B9789 Other viral agents as the cause of diseases classified elsewhere: Secondary | ICD-10-CM | POA: Diagnosis not present

## 2024-03-16 LAB — RESP PANEL BY RT-PCR (RSV, FLU A&B, COVID)  RVPGX2
Influenza A by PCR: NEGATIVE
Influenza B by PCR: NEGATIVE
Resp Syncytial Virus by PCR: NEGATIVE
SARS Coronavirus 2 by RT PCR: NEGATIVE

## 2024-03-16 MED ORDER — PREDNISONE 20 MG PO TABS
60.0000 mg | ORAL_TABLET | Freq: Once | ORAL | Status: AC
  Administered 2024-03-16: 60 mg via ORAL
  Filled 2024-03-16: qty 3

## 2024-03-16 MED ORDER — IPRATROPIUM-ALBUTEROL 0.5-2.5 (3) MG/3ML IN SOLN
3.0000 mL | Freq: Once | RESPIRATORY_TRACT | Status: AC
  Administered 2024-03-16: 3 mL via RESPIRATORY_TRACT
  Filled 2024-03-16: qty 3

## 2024-03-16 MED ORDER — ALBUTEROL SULFATE HFA 108 (90 BASE) MCG/ACT IN AERS
2.0000 | INHALATION_SPRAY | Freq: Once | RESPIRATORY_TRACT | Status: AC
  Administered 2024-03-16: 2 via RESPIRATORY_TRACT
  Filled 2024-03-16: qty 6.7

## 2024-03-16 MED ORDER — HYDROCOD POLI-CHLORPHE POLI ER 10-8 MG/5ML PO SUER: 5.0000 mL | Freq: Two times a day (BID) | ORAL | 0 refills | Status: AC | PRN

## 2024-03-16 MED ORDER — ACETAMINOPHEN 325 MG PO TABS
650.0000 mg | ORAL_TABLET | Freq: Once | ORAL | Status: AC
  Administered 2024-03-16: 650 mg via ORAL
  Filled 2024-03-16: qty 2

## 2024-03-16 MED ORDER — PREDNISONE 20 MG PO TABS: 40.0000 mg | ORAL_TABLET | Freq: Every day | ORAL | 0 refills | Status: AC

## 2024-03-16 NOTE — ED Triage Notes (Signed)
 Pt c/o non-productive cough and shortness of breath x 1 week. Pt denies fevers.

## 2024-03-16 NOTE — ED Notes (Signed)
 Pt declined covid/flu swab, states she took one at home and it was negative.

## 2024-03-16 NOTE — Discharge Instructions (Signed)
 Your history, exam, workup today led us  to get imaging and viral testing which were negative.  You did not have evidence of pneumonia on the imaging nor did you have evidence of COVID/flu/RSV.  I suspect you have a viral illness that is triggering your asthma worsening and wheezing.  Please use the burst of steroids for the next 4 days starting tomorrow and also consider using the stronger cough medicine.  Please rest and stay hydrated and follow-up with your primary doctor.  Please use your home inhaler as well.  If any symptoms change or worsen acutely, please return to the nearest emergency department.

## 2024-03-16 NOTE — ED Provider Notes (Signed)
 [place] EMERGENCY DEPARTMENT AT [facility] Provider Note   CSN: [id number] Arrival date & time: 03/16/24  1352     Patient presents with: Cough   Teresa Faulkner is a 58 y.o. female.   The history is provided by the patient and medical records. No language interpreter was used.  Cough Cough characteristics:  Non-productive Severity:  Severe Onset quality:  Gradual Duration:  1 week Timing:  Constant Progression:  Waxing and waning Chronicity:  New Context: sick contacts   Relieved by:  Nothing Worsened by:  Nothing Ineffective treatments:  Beta-agonist inhaler Associated symptoms: chills, headaches, rhinorrhea, shortness of breath, sinus congestion and wheezing   Associated symptoms: no chest pain (tightness reported), no diaphoresis, no fever and no rash        Prior to Admission medications   Medication Sig Start Date End Date Taking? Authorizing Provider  albuterol  (VENTOLIN  HFA) 108 (90 Base) MCG/ACT inhaler Inhale 2 puffs into the lungs every 6 (six) hours as needed for wheezing or shortness of breath. 03/27/23   Kennyth Domino, FNP  amoxicillin -clavulanate (AUGMENTIN ) 875-125 MG tablet Take 1 tablet by mouth every 12 (twelve) hours for 7 days. 03/10/24   [name], NP  azelastine (ASTELIN) 0.1 % nasal spray Place 2 sprays into both nostrils 2 (two) times daily. Use in each nostril as directed 03/10/24   [name], NP  benzonatate  (TESSALON ) 100 MG capsule Take 1 capsule (100 mg total) by mouth every 8 (eight) hours. 03/10/24   [name], NP  promethazine -dextromethorphan (PROMETHAZINE -DM) 6.25-15 MG/5ML syrup Take 5 mLs by mouth at bedtime as needed for cough. 03/10/24   [name], NP    Allergies: Aspirin and Aspirin-acetaminophen     Review of Systems  Constitutional:  Positive for chills and fatigue. Negative for diaphoresis and fever.  HENT:  Positive for congestion and rhinorrhea.   Eyes:  Negative for visual  disturbance.  Respiratory:  Positive for cough, chest tightness, shortness of breath and wheezing. Negative for stridor.   Cardiovascular:  Negative for chest pain (tightness reported), palpitations and leg swelling.  Gastrointestinal:  Negative for abdominal pain, constipation, diarrhea, nausea and vomiting.  Genitourinary:  Negative for dysuria and flank pain.  Musculoskeletal:  Negative for back pain, neck pain and neck stiffness.  Skin:  Negative for rash and wound.  Neurological:  Positive for headaches. Negative for weakness, light-headedness and numbness.  Psychiatric/Behavioral:  Negative for agitation.   All other systems reviewed and are negative.   Updated Vital Signs BP 138/77 (BP Location: Right Arm)   Pulse 94   Temp 98.4 F (36.9 C)   Resp 20   Ht 5' 7 (1.702 m)   Wt 99.8 kg   SpO2 97%   BMI 34.46 kg/m   Physical Exam Vitals and nursing note reviewed.  Constitutional:      General: She is not in acute distress.    Appearance: She is well-developed. She is not ill-appearing, toxic-appearing or diaphoretic.  HENT:     Head: Normocephalic and atraumatic.     Nose: Congestion and rhinorrhea present.     Mouth/Throat:     Pharynx: No oropharyngeal exudate or posterior oropharyngeal erythema.  Eyes:     Extraocular Movements: Extraocular movements intact.     Conjunctiva/sclera: Conjunctivae normal.     Pupils: Pupils are equal, round, and reactive to light.  Cardiovascular:     Rate and Rhythm: Normal rate and regular rhythm.     Heart  sounds: No murmur heard. Pulmonary:     Effort: Pulmonary effort is normal. No respiratory distress.     Breath sounds: Wheezing and rhonchi present. No rales.  Chest:     Chest wall: No tenderness.  Abdominal:     Palpations: Abdomen is soft.     Tenderness: There is no abdominal tenderness. There is no guarding.  Musculoskeletal:        General: No swelling.     Cervical back: Neck supple. No tenderness.  Skin:     General: Skin is warm and dry.     Capillary Refill: Capillary refill takes less than 2 seconds.     Findings: No erythema.  Neurological:     General: No focal deficit present.     Mental Status: She is alert.     Sensory: No sensory deficit.     Motor: No weakness.  Psychiatric:        Mood and Affect: Mood normal.     (all labs ordered are listed, but only abnormal results are displayed) Labs Reviewed  RESP PANEL BY RT-PCR (RSV, FLU A&B, COVID)  [id number]    EKG: EKG Interpretation Date/Time:  Sunday March 16 2024 15:52:54 EST Ventricular Rate:  89 PR Interval:  148 QRS Duration:  101 QT Interval:  363 QTC Calculation: 442 R Axis:   -6  Text Interpretation: Sinus rhythm Low voltage, precordial leads when compared to prior, similar appearance No STEMI Confirmed by Ginger Barefoot ([id number]) on 03/16/2024 4:16:40 PM  Radiology: [facility] Chest 2 View Result Date: 03/16/2024 EXAM: 2 VIEW(S) XRAY OF THE CHEST 03/16/2024 03:43:00 PM COMPARISON: 12/23/2022 CLINICAL HISTORY: cough, SOB, tightness, chills FINDINGS: LUNGS AND PLEURA: No focal pulmonary opacity. No pulmonary edema. No pleural effusion. No pneumothorax. HEART AND MEDIASTINUM: No acute abnormality of the cardiac and mediastinal silhouettes. BONES AND SOFT TISSUES: No acute osseous abnormality. IMPRESSION: 1. No acute cardiopulmonary process. Electronically signed by: Norman Gatlin MD 03/16/2024 03:59 PM EST RP Workstation: [id number]     Procedures   Medications Ordered in the ED  albuterol  (VENTOLIN  HFA) 108 (90 Base) MCG/ACT inhaler 2 puff (has no administration in time range)  ipratropium-albuterol  (DUONEB) 0.5-2.5 (3) MG/3ML nebulizer solution 3 mL (3 mLs Nebulization Given 03/16/24 1558)  predniSONE  (DELTASONE ) tablet 60 mg (60 mg Oral Given 03/16/24 1554)  acetaminophen  (TYLENOL ) tablet 650 mg (650 mg Oral Given 03/16/24 1554)                                    Medical Decision Making Amount and/or Complexity of Data  Reviewed Radiology: ordered.  Risk OTC drugs. Prescription drug management.    DELENA Faulkner is a 57 y.o. female with past medical history significant for migraines, asthma, GERD and hypercholesterolemia who presents with subjective chills, dry cough, wheezing, chest tightness, shortness of breath.  According to patient, she has had the symptoms for about a week and her granddaughter had similar URI symptoms.  She reports she tested for COVID at home and was negative yesterday.  She tells me that she has been trying her home inhaler and it has not been helping.  She still has the wheezing and the cough.  She reports no syncope and no lightheadedness.  She denies nausea, vomiting, constipation, diarrhea, or urinary changes.  Denies new leg pain or leg swelling.  No history of blood clots reported.  No trauma.  No rashes to suggest shingles.  Patient otherwise has rhinorrhea and congestion and is having some mild headache.  On exam, lungs did have some faint wheezing and some rhonchi.  There were no rales.  Chest not focally tender and there is no murmur.  Intact pulses in extremities.  Legs on edematous.  Patient has rhinorrhea and congestion but did not have stridor.  Oropharyngeal exam did not show evidence of PTA or RPA.  Patient otherwise well-appearing.  We had a shared decision made conversation with management.  Will get a chest x-ray given the abnormal breath sounds and will give her breathing treatment and some oral steroids.  Will hold on IV placement and blood draw with a shared decision making conversation.  We will give her some Tylenol  and we will also give her some oral steroids given the wheezing and likely asthma exacerbation in the setting of viral URI.  Will make sure there is no pneumonia with the x-ray.  We will check for COVID/flu/RSV.  Anticipate reassessment after workup to determine disposition.  6:11 PM Workup returned reassuring.  Patient's x-ray did not show pneumonia  and her viral test was negative for COVID/flu/RSV.  After breathing treatment and some steroids she is breathing better without wheezing now.  Do feel she is safe for discharge home but we will give prescription for burst of steroids, Tussionex, and we will also give her 2 puffs again now to take the albuterol  with her home.  She will follow-up with the PCP and understood return precautions.  She had no questions or concerns I will be discharged for outpatient follow-up.     Final diagnoses:  Acute cough  Viral URI with cough  Wheeze  Uncomplicated asthma, unspecified asthma severity, unspecified whether persistent    ED Discharge Orders          Ordered    predniSONE  (DELTASONE ) 20 MG tablet  Daily        03/16/24 1814    chlorpheniramine-HYDROcodone  (TUSSIONEX) 10-8 MG/5ML  Every 12 hours PRN        03/16/24 1814           Clinical Impression: 1. Acute cough   2. Viral URI with cough   3. Wheeze   4. Uncomplicated asthma, unspecified asthma severity, unspecified whether persistent     Disposition: Discharge  Condition: Good  I have discussed the results, Dx and Tx plan with the pt(& family if present). He/she/they expressed understanding and agree(s) with the plan. Discharge instructions discussed at great length. Strict return precautions discussed and pt &/or family have verbalized understanding of the instructions. No further questions at time of discharge.    New Prescriptions   CHLORPHENIRAMINE-HYDROCODONE  (TUSSIONEX) 10-8 MG/5ML    Take 5 mLs by mouth every 12 (twelve) hours as needed for cough.   PREDNISONE  (DELTASONE ) 20 MG TABLET    Take 2 tablets (40 mg total) by mouth daily for 4 days.    Follow Up: Georgina Speaks, FNP [address] [place] [address] [phone]     [facility] Emergency Department at [facility] [address] [place]  [address] [phone]        Kashina Mecum, Lonni PARAS,  MD 03/16/24 1815

## 2024-03-16 NOTE — ED Notes (Signed)
 Pt declined covid-swab, said she already took one at home and it was negative.

## 2024-03-16 NOTE — ED Triage Notes (Signed)
 Pt is here to be seen for coughing.
